# Patient Record
Sex: Male | Born: 1940 | Race: Black or African American | Hispanic: No | Smoking: Former smoker
Health system: Southern US, Community
[De-identification: ages and names within clinical notes are randomized; demographics above are authoritative.]

## PROBLEM LIST (undated history)

## (undated) DIAGNOSIS — R202 Paresthesia of skin: Secondary | ICD-10-CM

## (undated) DIAGNOSIS — R7303 Prediabetes: Secondary | ICD-10-CM

## (undated) DIAGNOSIS — R200 Anesthesia of skin: Secondary | ICD-10-CM

## (undated) DIAGNOSIS — F32A Depression, unspecified: Secondary | ICD-10-CM

## (undated) DIAGNOSIS — M1990 Unspecified osteoarthritis, unspecified site: Secondary | ICD-10-CM

## (undated) DIAGNOSIS — I8390 Asymptomatic varicose veins of unspecified lower extremity: Secondary | ICD-10-CM

## (undated) DIAGNOSIS — I350 Nonrheumatic aortic (valve) stenosis: Secondary | ICD-10-CM

## (undated) DIAGNOSIS — F329 Major depressive disorder, single episode, unspecified: Secondary | ICD-10-CM

## (undated) DIAGNOSIS — F419 Anxiety disorder, unspecified: Secondary | ICD-10-CM

## (undated) DIAGNOSIS — I10 Essential (primary) hypertension: Secondary | ICD-10-CM

## (undated) DIAGNOSIS — K219 Gastro-esophageal reflux disease without esophagitis: Secondary | ICD-10-CM

## (undated) DIAGNOSIS — G4730 Sleep apnea, unspecified: Secondary | ICD-10-CM

## (undated) HISTORY — DX: Nonrheumatic aortic (valve) stenosis: I35.0

## (undated) HISTORY — DX: Essential (primary) hypertension: I10

## (undated) HISTORY — DX: Unspecified osteoarthritis, unspecified site: M19.90

## (undated) HISTORY — DX: Asymptomatic varicose veins of unspecified lower extremity: I83.90

---

## 1999-08-15 ENCOUNTER — Encounter: Admission: RE | Admit: 1999-08-15 | Discharge: 1999-08-15 | Payer: Self-pay | Admitting: Nephrology

## 1999-08-15 ENCOUNTER — Encounter: Payer: Self-pay | Admitting: Nephrology

## 1999-10-26 ENCOUNTER — Ambulatory Visit: Admission: RE | Admit: 1999-10-26 | Discharge: 1999-10-26 | Payer: Self-pay | Admitting: Internal Medicine

## 2002-05-26 ENCOUNTER — Emergency Department (HOSPITAL_COMMUNITY): Admission: EM | Admit: 2002-05-26 | Discharge: 2002-05-26 | Payer: Self-pay | Admitting: Emergency Medicine

## 2002-05-26 ENCOUNTER — Encounter: Payer: Self-pay | Admitting: Emergency Medicine

## 2003-10-29 ENCOUNTER — Ambulatory Visit (HOSPITAL_COMMUNITY): Admission: RE | Admit: 2003-10-29 | Discharge: 2003-10-29 | Payer: Self-pay | Admitting: Gastroenterology

## 2003-12-24 ENCOUNTER — Encounter: Admission: RE | Admit: 2003-12-24 | Discharge: 2003-12-24 | Payer: Self-pay | Admitting: Gastroenterology

## 2005-01-23 ENCOUNTER — Ambulatory Visit (HOSPITAL_COMMUNITY): Admission: RE | Admit: 2005-01-23 | Discharge: 2005-01-23 | Payer: Self-pay | Admitting: Emergency Medicine

## 2011-07-03 ENCOUNTER — Encounter (HOSPITAL_COMMUNITY): Payer: Self-pay

## 2011-07-03 ENCOUNTER — Emergency Department (HOSPITAL_COMMUNITY)
Admission: EM | Admit: 2011-07-03 | Discharge: 2011-07-03 | Disposition: A | Discharge status: Home / Self Care | Payer: Medicare Other | Attending: Emergency Medicine | Admitting: Emergency Medicine

## 2011-07-03 DIAGNOSIS — Z7982 Long term (current) use of aspirin: Secondary | ICD-10-CM | POA: Insufficient documentation

## 2011-07-03 DIAGNOSIS — R609 Edema, unspecified: Secondary | ICD-10-CM | POA: Insufficient documentation

## 2011-07-03 DIAGNOSIS — IMO0002 Reserved for concepts with insufficient information to code with codable children: Secondary | ICD-10-CM | POA: Insufficient documentation

## 2011-07-03 DIAGNOSIS — Z8639 Personal history of other endocrine, nutritional and metabolic disease: Secondary | ICD-10-CM | POA: Insufficient documentation

## 2011-07-03 DIAGNOSIS — M1990 Unspecified osteoarthritis, unspecified site: Secondary | ICD-10-CM | POA: Insufficient documentation

## 2011-07-03 DIAGNOSIS — I10 Essential (primary) hypertension: Secondary | ICD-10-CM | POA: Insufficient documentation

## 2011-07-03 DIAGNOSIS — M545 Low back pain, unspecified: Secondary | ICD-10-CM | POA: Insufficient documentation

## 2011-07-03 DIAGNOSIS — F411 Generalized anxiety disorder: Secondary | ICD-10-CM | POA: Insufficient documentation

## 2011-07-03 DIAGNOSIS — M79609 Pain in unspecified limb: Secondary | ICD-10-CM | POA: Insufficient documentation

## 2011-07-03 DIAGNOSIS — Z862 Personal history of diseases of the blood and blood-forming organs and certain disorders involving the immune mechanism: Secondary | ICD-10-CM | POA: Insufficient documentation

## 2011-07-03 DIAGNOSIS — Z79899 Other long term (current) drug therapy: Secondary | ICD-10-CM | POA: Insufficient documentation

## 2011-07-04 ENCOUNTER — Ambulatory Visit (HOSPITAL_COMMUNITY)
Admission: AD | Admit: 2011-07-04 | Discharge: 2011-07-04 | Disposition: A | Discharge status: Home / Self Care | Payer: Medicare Other | Source: Ambulatory Visit | Attending: Family Medicine | Admitting: Family Medicine

## 2011-07-04 DIAGNOSIS — M7989 Other specified soft tissue disorders: Secondary | ICD-10-CM

## 2011-07-04 DIAGNOSIS — M79609 Pain in unspecified limb: Secondary | ICD-10-CM

## 2013-08-06 ENCOUNTER — Encounter (HOSPITAL_COMMUNITY): Payer: Self-pay | Admitting: Emergency Medicine

## 2013-08-06 ENCOUNTER — Emergency Department (HOSPITAL_COMMUNITY)
Admission: EM | Admit: 2013-08-06 | Discharge: 2013-08-06 | Disposition: A | Discharge status: Home / Self Care | Payer: Medicare Other | Attending: Emergency Medicine | Admitting: Emergency Medicine

## 2013-08-06 DIAGNOSIS — I10 Essential (primary) hypertension: Secondary | ICD-10-CM | POA: Insufficient documentation

## 2013-08-06 DIAGNOSIS — M25559 Pain in unspecified hip: Secondary | ICD-10-CM | POA: Insufficient documentation

## 2013-08-06 DIAGNOSIS — Z8719 Personal history of other diseases of the digestive system: Secondary | ICD-10-CM | POA: Insufficient documentation

## 2013-08-06 DIAGNOSIS — M7989 Other specified soft tissue disorders: Secondary | ICD-10-CM | POA: Insufficient documentation

## 2013-08-06 DIAGNOSIS — M79604 Pain in right leg: Secondary | ICD-10-CM

## 2013-08-06 HISTORY — DX: Gastro-esophageal reflux disease without esophagitis: K21.9

## 2013-08-06 LAB — CBC WITH DIFFERENTIAL/PLATELET
Basophils Absolute: 0 10*3/uL (ref 0.0–0.1)
Basophils Relative: 0 % (ref 0–1)
Eosinophils Absolute: 0.2 10*3/uL (ref 0.0–0.7)
Eosinophils Relative: 3 % (ref 0–5)
HCT: 36.6 % — ABNORMAL LOW (ref 39.0–52.0)
Hemoglobin: 12.4 g/dL — ABNORMAL LOW (ref 13.0–17.0)
Lymphocytes Relative: 30 % (ref 12–46)
Lymphs Abs: 1.8 10*3/uL (ref 0.7–4.0)
MCH: 33.3 pg (ref 26.0–34.0)
MCHC: 33.9 g/dL (ref 30.0–36.0)
MCV: 98.4 fL (ref 78.0–100.0)
Monocytes Absolute: 0.5 10*3/uL (ref 0.1–1.0)
Monocytes Relative: 8 % (ref 3–12)
Neutro Abs: 3.5 10*3/uL (ref 1.7–7.7)
Neutrophils Relative %: 59 % (ref 43–77)
Platelets: 193 10*3/uL (ref 150–400)
RBC: 3.72 MIL/uL — ABNORMAL LOW (ref 4.22–5.81)
RDW: 13.4 % (ref 11.5–15.5)
WBC: 5.9 10*3/uL (ref 4.0–10.5)

## 2013-08-06 LAB — BASIC METABOLIC PANEL
BUN: 14 mg/dL (ref 6–23)
CO2: 29 mEq/L (ref 19–32)
Calcium: 9.9 mg/dL (ref 8.4–10.5)
Chloride: 103 mEq/L (ref 96–112)
Creatinine, Ser: 1 mg/dL (ref 0.50–1.35)
GFR calc Af Amer: 85 mL/min — ABNORMAL LOW (ref >90)
GFR calc non Af Amer: 74 mL/min — ABNORMAL LOW (ref >90)
Glucose, Bld: 95 mg/dL (ref 70–99)
Potassium: 3.7 mEq/L (ref 3.5–5.1)
Sodium: 140 mEq/L (ref 135–145)

## 2013-08-06 MED ORDER — ENOXAPARIN SODIUM 120 MG/0.8ML ~~LOC~~ SOLN
1.0000 mg/kg | Freq: Once | SUBCUTANEOUS | Status: AC
  Administered 2013-08-06: 110 mg via SUBCUTANEOUS
  Filled 2013-08-06: qty 0.8

## 2013-08-06 NOTE — ED Notes (Signed)
Pt c/o leg pain and swelling for the past three days, denies any injury, admits to driving to [place] last weekend,

## 2013-08-06 NOTE — ED Provider Notes (Signed)
CSN: [id number]     Arrival date & time 08/06/13  1802 History   First MD Initiated Contact with Patient 08/06/13 2052    Scribed for Benny Lennert, MD, the patient was seen in room APA07/APA07. This chart was scribed by Lewanda Rife, ED scribe. Patient's care was started at 8:56 PM  Chief Complaint  Patient presents with  . Leg Pain   (Consider location/radiation/quality/duration/timing/severity/associated sxs/prior Treatment) Patient is a 72 y.o. male presenting with leg pain. The history is provided by the patient. No language interpreter was used.  Leg Pain Location:  Leg Time since incident:  3 days Injury: no   Leg location:  R upper leg Pain details:    Radiates to:  Does not radiate   Severity:  Moderate   Onset quality:  Gradual   Timing:  Constant   Progression:  Worsening Chronicity:  New Associated symptoms: swelling   Associated symptoms: no back pain and no fatigue    HPI Comments: John Hanson is a 72 y.o. male who presents to the Emergency Department complaining of constant posterior right mid-thigh pain onset 4 days. Describes pain as worsening in severity. Reports associated worsening unilateral swelling. Reports pain is exacerbated by touch and walking. Denies any alleviating factors. Denies associated shortness of breath, chest pain, abdominal pain, and mildly antalgic gait. Reports recent long travel to [place] this past weekend.   Past Medical History  Diagnosis Date  . Hypertension   . GERD (gastroesophageal reflux disease)    History reviewed. No pertinent past surgical history. No family history on file. History  Substance Use Topics  . Smoking status: Never Smoker   . Smokeless tobacco: Not on file  . Alcohol Use: Not on file    Review of Systems  Constitutional: Negative for appetite change and fatigue.  HENT: Negative for congestion, ear discharge and sinus pressure.   Eyes: Negative for discharge.  Respiratory: Negative for  cough.   Cardiovascular: Positive for leg swelling. Negative for chest pain.  Gastrointestinal: Negative for abdominal pain and diarrhea.  Genitourinary: Negative for frequency and hematuria.  Musculoskeletal: Negative for back pain.  Skin: Negative for rash.  Neurological: Negative for seizures and headaches.  Psychiatric/Behavioral: Negative for hallucinations.  All other systems reviewed and are negative.    Allergies  Review of patient's allergies indicates no known allergies.  Home Medications  No current outpatient prescriptions on file. BP 150/71  Pulse 86  Temp(Src) 98 F (36.7 C) (Oral)  Resp 16  Ht 5' 8.5" (1.74 m)  Wt 240 lb (108.863 kg)  BMI 35.96 kg/m2  SpO2 97% Physical Exam  Constitutional: He is oriented to person, place, and time. He appears well-developed.  HENT:  Head: Normocephalic.  Eyes: Conjunctivae and EOM are normal. No scleral icterus.  Neck: Neck supple. No thyromegaly present.  Cardiovascular: Normal rate and regular rhythm.  Exam reveals no gallop and no friction rub.   No murmur heard. Pulmonary/Chest: No stridor. He has no wheezes. He has no rales. He exhibits no tenderness.  Abdominal: He exhibits no distension. There is no tenderness. There is no rebound.  Musculoskeletal: Normal range of motion. He exhibits no edema.  Right leg distally is swollen  TTP  Posterior right mid-thigh    Lymphadenopathy:    He has no cervical adenopathy.  Neurological: He is oriented to person, place, and time. He exhibits normal muscle tone. Coordination normal.  Skin: No rash noted. No erythema.  Psychiatric: He has a normal mood  and affect. His behavior is normal.    ED Course  Procedures (including critical care time)  COORDINATION OF CARE:  Nursing notes reviewed. Vital signs reviewed. Initial pt interview and examination performed.   9:13 PM-Discussed work up plan with pt at bedside, which includes CBC with diff, and BMP. Pt agrees with  plan.  COORDINATION OF CARE:  Nursing notes reviewed. Vital signs reviewed. Initial pt interview and examination performed.   10:38 PM-Discussed work up plan with pt at bedside, which includes doppler study of posterior mid-thigh pain, and lovenox. Pt agrees with plan.   Treatment plan initiated: Medications  enoxaparin (LOVENOX) injection 110 mg (not administered)     Initial diagnostic testing ordered.    Treatment plan initiated: Medications  enoxaparin (LOVENOX) injection 110 mg (not administered)     Initial diagnostic testing ordered.    Labs Review Labs Reviewed  CBC WITH DIFFERENTIAL - Abnormal; Notable for the following:    RBC 3.72 (*)    Hemoglobin 12.4 (*)    HCT 36.6 (*)    All other components within normal limits  BASIC METABOLIC PANEL - Abnormal; Notable for the following:    GFR calc non Af Amer 74 (*)    GFR calc Af Amer 85 (*)    All other components within normal limits   Imaging Review No results found.  EKG Interpretation   None       MDM  No diagnosis found. Swollen right leg.  Will get venous US tomorrow The chart was scribed for me under my direct supervision.  I personally performed the history, physical, and medical decision making and all procedures in the evaluation of this patient.Benny Lennert, MD 08/07/13 2215

## 2013-08-07 ENCOUNTER — Other Ambulatory Visit (HOSPITAL_COMMUNITY): Payer: Self-pay | Admitting: Emergency Medicine

## 2013-08-07 ENCOUNTER — Ambulatory Visit (HOSPITAL_COMMUNITY)
Admission: RE | Admit: 2013-08-07 | Discharge: 2013-08-07 | Disposition: A | Discharge status: Home / Self Care | Payer: Medicare Other | Source: Ambulatory Visit | Attending: Emergency Medicine | Admitting: Emergency Medicine

## 2013-08-07 DIAGNOSIS — M79604 Pain in right leg: Secondary | ICD-10-CM

## 2013-08-07 DIAGNOSIS — M79609 Pain in unspecified limb: Secondary | ICD-10-CM | POA: Insufficient documentation

## 2013-08-07 MED ORDER — TRAMADOL HCL 50 MG PO TABS: 50.0000 mg | ORAL_TABLET | Freq: Four times a day (QID) | ORAL | Status: DC | PRN

## 2013-08-08 ENCOUNTER — Ambulatory Visit (HOSPITAL_COMMUNITY): Payer: Medicare Other

## 2013-08-15 ENCOUNTER — Ambulatory Visit
Admission: RE | Admit: 2013-08-15 | Discharge: 2013-08-15 | Disposition: A | Discharge status: Home / Self Care | Payer: Medicare HMO | Source: Ambulatory Visit | Attending: Specialist | Admitting: Specialist

## 2013-08-15 ENCOUNTER — Other Ambulatory Visit: Payer: Self-pay | Admitting: Specialist

## 2013-08-15 ENCOUNTER — Other Ambulatory Visit (HOSPITAL_COMMUNITY): Payer: Self-pay | Admitting: Specialist

## 2013-08-15 DIAGNOSIS — I878 Other specified disorders of veins: Secondary | ICD-10-CM

## 2013-08-15 MED ORDER — IOHEXOL 300 MG/ML  SOLN
150.0000 mL | Freq: Once | INTRAMUSCULAR | Status: AC | PRN
  Administered 2013-08-15: 150 mL via INTRAVENOUS

## 2013-09-07 ENCOUNTER — Other Ambulatory Visit: Payer: Self-pay | Admitting: Specialist

## 2013-09-07 DIAGNOSIS — M25452 Effusion, left hip: Secondary | ICD-10-CM

## 2013-09-07 DIAGNOSIS — M7989 Other specified soft tissue disorders: Secondary | ICD-10-CM

## 2013-09-14 ENCOUNTER — Other Ambulatory Visit: Payer: Medicare Other

## 2013-09-15 ENCOUNTER — Ambulatory Visit
Admission: RE | Admit: 2013-09-15 | Discharge: 2013-09-15 | Disposition: A | Discharge status: Home / Self Care | Payer: Medicare HMO | Source: Ambulatory Visit | Attending: Specialist | Admitting: Specialist

## 2013-09-15 ENCOUNTER — Other Ambulatory Visit: Payer: Self-pay | Admitting: Specialist

## 2013-09-15 DIAGNOSIS — M25452 Effusion, left hip: Secondary | ICD-10-CM

## 2013-09-15 DIAGNOSIS — M7989 Other specified soft tissue disorders: Secondary | ICD-10-CM

## 2013-09-15 MED ORDER — GADOBENATE DIMEGLUMINE 529 MG/ML IV SOLN
20.0000 mL | Freq: Once | INTRAVENOUS | Status: AC | PRN
  Administered 2013-09-15: 20 mL via INTRAVENOUS

## 2013-11-20 ENCOUNTER — Other Ambulatory Visit: Payer: Self-pay | Admitting: Specialist

## 2013-11-20 DIAGNOSIS — S8011XA Contusion of right lower leg, initial encounter: Secondary | ICD-10-CM

## 2013-11-28 ENCOUNTER — Ambulatory Visit
Admission: RE | Admit: 2013-11-28 | Discharge: 2013-11-28 | Disposition: A | Discharge status: Home / Self Care | Payer: Commercial Managed Care - HMO | Source: Ambulatory Visit | Attending: Specialist | Admitting: Specialist

## 2013-11-28 DIAGNOSIS — S8011XA Contusion of right lower leg, initial encounter: Secondary | ICD-10-CM

## 2013-12-21 ENCOUNTER — Other Ambulatory Visit: Payer: Self-pay | Admitting: *Deleted

## 2013-12-21 DIAGNOSIS — M79609 Pain in unspecified limb: Secondary | ICD-10-CM

## 2014-02-07 ENCOUNTER — Encounter: Payer: Self-pay | Admitting: Vascular Surgery

## 2014-02-08 ENCOUNTER — Ambulatory Visit (HOSPITAL_COMMUNITY)
Admission: RE | Admit: 2014-02-08 | Discharge: 2014-02-08 | Disposition: A | Discharge status: Home / Self Care | Payer: Medicare HMO | Source: Ambulatory Visit | Attending: Vascular Surgery | Admitting: Vascular Surgery

## 2014-02-08 ENCOUNTER — Ambulatory Visit (INDEPENDENT_AMBULATORY_CARE_PROVIDER_SITE_OTHER): Payer: Commercial Managed Care - HMO | Admitting: Vascular Surgery

## 2014-02-08 ENCOUNTER — Encounter: Payer: Self-pay | Admitting: Vascular Surgery

## 2014-02-08 VITALS — BP 177/91 | HR 72 | Ht 68.5 in | Wt 238.0 lb

## 2014-02-08 DIAGNOSIS — M79609 Pain in unspecified limb: Secondary | ICD-10-CM

## 2014-02-08 NOTE — Progress Notes (Signed)
[facility] [place] HISTORY AND PHYSICAL   CC:  Varicose veins and pain in right leg  Koirala, Dibas, MD  HPI: This is a 73 y.o. male who has a hx of HTN has complaints of pain in his right leg and swelling for ~ 1 year.  He states that he has tried thigh high compression stockings for 2 months, but he did not get any relief from the pain.  He has not ever had any surgery on his right leg or trauma to the right leg.  He denies any hx of DVT.  He is on medication for HTN.  He does not have a hx of diabetes.  He does not have a surgical history.  He has a remote hx of cigar use.  Past Medical History  Diagnosis Date  . Hypertension   . GERD (gastroesophageal reflux disease)    History reviewed. No pertinent past surgical history.  No Known Allergies  Current Outpatient Prescriptions  Medication Sig Dispense Refill  . allopurinol (ZYLOPRIM) 300 MG tablet Take 300 mg by mouth daily.      Marland Kitchen amLODipine (NORVASC) 10 MG tablet Take 10 mg by mouth daily.      Marland Kitchen aspirin EC 81 MG tablet Take 81 mg by mouth daily.      . citalopram (CELEXA) 40 MG tablet Take 40 mg by mouth daily.      . furosemide (LASIX) 20 MG tablet Take 20 mg by mouth daily.      Marland Kitchen labetalol (NORMODYNE) 300 MG tablet Take 300 mg by mouth 3 (three) times daily.      Marland Kitchen losartan (COZAAR) 100 MG tablet Take 100 mg by mouth daily.      Marland Kitchen omeprazole (PRILOSEC) 40 MG capsule Take 40 mg by mouth daily.      . traMADol (ULTRAM) 50 MG tablet Take 1 tablet (50 mg total) by mouth every 6 (six) hours as needed.  15 tablet  0   No current facility-administered medications for this visit.    History reviewed. No pertinent family history.  History   Social History  . Marital Status: Widowed    Spouse Name: N/A    Number of Children: N/A  . Years of Education: N/A   Occupational History  . Not on file.   Social History Main Topics  . Smoking status: Former Smoker    Types: Cigars  . Smokeless tobacco:  Never Used  . Alcohol Use: No  . Drug Use: No  . Sexual Activity: Not on file   Other Topics Concern  . Not on file   Social History Narrative  . No narrative on file     ROS: [x]  Positive   [ ]  Negative   [ ]  All sytems reviewed and are negative  Cardiovascular: []  chest pain/pressure []  palpitations []  SOB lying flat []  DOE [x] pain in legs while walking []  pain in feet when lying flat []  hx of DVT [x]  hx of phlebitis []  swelling in legs [x]  varicose veins  Pulmonary: []  productive cough []  asthma []  wheezing  Neurologic: []  weakness in []  arms []  legs []  numbness in []  arms []  legs [] difficulty speaking or slurred speech []  temporary loss of vision in one eye []  dizziness  Hematologic: []  bleeding problems []  problems with blood clotting easily  GI []  vomiting blood []  blood in stool  GU: []  burning with urination []  blood in urine  Psychiatric: []  hx of major depression  Integumentary: []   rashes []  ulcers  Constitutional: []  fever []  chills   PHYSICAL EXAMINATION:  Filed Vitals:   02/08/14 1435  BP: 177/91  Pulse: 72   Body mass index is 35.66 kg/(m^2).  General:  WDWN in NAD Gait: Not observed HENT: WNL, normocephalic Eyes: Pupils equal Pulmonary: normal non-labored breathing , without Rales, rhonchi,  wheezing Cardiac: RRR, without  Murmurs, rubs or gallops; without carotid bruits Abdomen: soft, NT, no masses Skin: without rashes, without ulcers  Vascular Exam/Pulses: 2+ radial pulses bilaterally 1+ DP pulses bilaterally  Extremities: without ischemic changes, without Gangrene , without cellulitis; without open wounds;  His right leg is edematous and ~ 10% larger than the left leg.  He does have a prominent saphenous vein on the right that measures approximately 0.4 cm. Musculoskeletal: no muscle wasting or atrophy  Neurologic: A&O X 3; Appropriate Affect ; SENSATION: normal; MOTOR FUNCTION:  moving all extremities equally.  Speech is fluent/normal   Non-Invasive Vascular Imaging:  Lower extremity duplex 02/08/14  1.  No evidence of DVT noted in the right femoral-popliteal  2.  No evidence of superficial vein thrombosis noted in the RLE 3.  Venous incompetence is noted in the right great saphenous and common femoral veins.  Size of right GSV ranges from 0.45-0.67cm  Pt meds includes: Statin:  no Beta Blocker:  yes Aspirin:  yes ACEI:  no ARB:  yes Other Antiplatelet/Anticoagulant:  no   ASSESSMENT/PLAN: 73 y.o. 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  -will give Rx for compression stockings today for him to wear for 3 months.  Will have him follow up in 3 months for laser ablation of the GSV on the right as he will benefit from this as he does have reflux noted in the the right GSV.   -until then, in addition to the stockings, he may take Aleve and keep legs elevated when not ambulating.  Leontine Locket, PA-C [facility] [phone]  Clinic MD:  Pt seen and examined in conjunction with Dr. Oneida Alar  History and exam details as above. The patient is very active at work and his pain in his lower extremities it is exacerbated by this. He has evidence of deep and superficial venous reflux in the right leg. Palpable varicose saphenous vein right leg with edema. The right greater saphenous vein is 5-7 mm in diameter. Do a trial compression therapy. His symptoms are not improved we will consider laser ablation. Pathophysiology of deep and superficial venous incompetence were explained the patient today.  Ruta Hinds, MD [facility] [place] Office: [phone] Pager: [phone]

## 2014-05-14 ENCOUNTER — Encounter: Payer: Self-pay | Admitting: Vascular Surgery

## 2014-05-15 ENCOUNTER — Encounter: Payer: Self-pay | Admitting: Vascular Surgery

## 2014-05-15 ENCOUNTER — Ambulatory Visit (INDEPENDENT_AMBULATORY_CARE_PROVIDER_SITE_OTHER): Payer: Commercial Managed Care - HMO | Admitting: Vascular Surgery

## 2014-05-15 VITALS — BP 155/87 | HR 67 | Ht 68.5 in | Wt 239.0 lb

## 2014-05-15 DIAGNOSIS — I83893 Varicose veins of bilateral lower extremities with other complications: Secondary | ICD-10-CM | POA: Insufficient documentation

## 2014-05-15 NOTE — Progress Notes (Signed)
Referred by:  Lujean Amel, MD [address], [place] [address]  Reason for referral: Right swollen leg  History of Present Illness  John Hanson is a 73 y.o. (1941-06-03) male who presents for three month follow-up of right swollen and painful leg.  Patient notes onset of swelling one year ago. He has tried thigh high compression stockings over the past five months with no relief. He has been using ibuprofen and elevation for pain relief. He was last seen in the office on 02/08/14 by Dr. Oneida Alar at which he had only been wearing compression stockings for 2 months.    Past Medical History  Diagnosis Date  . Hypertension   . GERD (gastroesophageal reflux disease)     History reviewed. No pertinent past surgical history.  History   Social History  . Marital Status: Widowed    Spouse Name: N/A    Number of Children: N/A  . Years of Education: N/A   Occupational History  . Not on file.   Social History Main Topics  . Smoking status: Former Smoker    Types: Cigars  . Smokeless tobacco: Never Used  . Alcohol Use: No  . Drug Use: No  . Sexual Activity: Not on file   Other Topics Concern  . Not on file   Social History Narrative  . No narrative on file    No family history on file.   Current Outpatient Prescriptions on File Prior to Visit  Medication Sig Dispense Refill  . allopurinol (ZYLOPRIM) 300 MG tablet Take 300 mg by mouth daily.      Marland Kitchen amLODipine (NORVASC) 10 MG tablet Take 10 mg by mouth daily.      Marland Kitchen aspirin EC 81 MG tablet Take 81 mg by mouth daily.      . citalopram (CELEXA) 40 MG tablet Take 40 mg by mouth daily.      . furosemide (LASIX) 20 MG tablet Take 20 mg by mouth daily.      Marland Kitchen labetalol (NORMODYNE) 300 MG tablet Take 300 mg by mouth 3 (three) times daily.      Marland Kitchen losartan (COZAAR) 100 MG tablet Take 100 mg by mouth daily.      Marland Kitchen omeprazole (PRILOSEC) 40 MG capsule Take 40 mg by mouth daily.      . traMADol (ULTRAM)  50 MG tablet Take 1 tablet (50 mg total) by mouth every 6 (six) hours as needed.  15 tablet  0   No current facility-administered medications on file prior to visit.    No Known Allergies  REVIEW OF SYSTEMS:  (Positives checked otherwise negative)  CARDIOVASCULAR:  []  chest pain, []  chest pressure, []  palpitations, []  shortness of breath when laying flat, []  shortness of breath with exertion,  [x]  pain in legs when walking, []  pain in feet when laying flat, []  history of blood clot in veins (DVT), [x]  history of phlebitis, x[]  swelling in legs, [x]  varicose veins  PULMONARY:  []  productive cough, []  asthma, []  wheezing  NEUROLOGIC:  []  weakness in arms or legs, []  numbness in arms or legs, []  difficulty speaking or slurred speech, []  temporary loss of vision in one eye, []  dizziness  HEMATOLOGIC:  []  bleeding problems, []  problems with blood clotting too easily  MUSCULOSKEL:  []  joint pain, []  joint swelling  GASTROINTEST:  []  vomiting blood, []  blood in stool     GENITOURINARY:  []  burning with urination, []  blood in urine  PSYCHIATRIC:  []   history of major depression  INTEGUMENTARY:  []  rashes, []  ulcers  CONSTITUTIONAL:  []  fever, []  chills   Physical Examination Filed Vitals:   05/15/14 1620  BP: 155/87  Pulse: 67  Height: 5' 8.5" (1.74 m)  Weight: 239 lb (108.41 kg)  SpO2: 100%   Body mass index is 35.81 kg/(m^2).  General: A&O x 3, WDWN in NAD Extremities: No ischemic changes. Bilateral leg edema, right greater than left. Prominent saphenous vein on right   Non-Invasive Vascular Imaging: Lower extremity duplex 02/08/14  1. No evidence of DVT noted in the right femoral-popliteal  2. No evidence of superficial vein thrombosis noted in the RLE  3. Venous incompetence is noted in the right great saphenous and common femoral veins.  Size of right GSV ranges from 0.45-0.67cm   Medical Decision Making  John Hanson is a 73 y.o. male who presents with: right  lower leg chronic venous insufficiency   The patient has tried a three month trial of  20-30 mm thigh high compression stockings and has had no relief of his symptoms.   Based on the patient's history and examination, I recommend: laser ablation of the right great saphenous vein.   The procedure, benefits, and risks were discussed at length with the patient and he has decided to proceed.   His laser ablation of the right great saphenous vein will be scheduled in the next few weeks.   Virgina Jock, PA-C [facility] Office: [phone] Pager: [phone]  05/15/2014, 4:39 PM  This patient was seen in conjunction with Dr. Donnetta Hutching.  I have examined the patient, reviewed and agree with above. The patient continues to have difficulty despite maximal medical treatment. This is included thigh-high graduated compression, elevation and ibuprofen for discomfort. I did review his duplex discussed options for laser ablation. He does have mild symptoms on the left is questioning treatment left. I explained we would not recommend this this time since his symptoms are not significant. Explained that he did have progression he could have treatment in the future. He wished to proceed with laser ablation. I discussed this with the patient including the slight risk of DVT associated with the procedure. He wished to proceed as soon as possible  Gaytha Raybourn, MD 05/15/2014 5:06 PM

## 2014-05-24 ENCOUNTER — Other Ambulatory Visit: Payer: Self-pay | Admitting: *Deleted

## 2014-05-24 DIAGNOSIS — I83893 Varicose veins of bilateral lower extremities with other complications: Secondary | ICD-10-CM

## 2014-05-30 ENCOUNTER — Encounter: Payer: Self-pay | Admitting: Vascular Surgery

## 2014-05-31 ENCOUNTER — Encounter: Payer: Self-pay | Admitting: Vascular Surgery

## 2014-05-31 ENCOUNTER — Ambulatory Visit (INDEPENDENT_AMBULATORY_CARE_PROVIDER_SITE_OTHER): Payer: Commercial Managed Care - HMO | Admitting: Vascular Surgery

## 2014-05-31 VITALS — BP 195/92 | HR 62 | Resp 18 | Ht 68.5 in | Wt 241.0 lb

## 2014-05-31 DIAGNOSIS — I83893 Varicose veins of bilateral lower extremities with other complications: Secondary | ICD-10-CM

## 2014-05-31 HISTORY — PX: ENDOVENOUS ABLATION SAPHENOUS VEIN W/ LASER: SUR449

## 2014-05-31 NOTE — Progress Notes (Signed)
   Laser Ablation Procedure      Date: 05/31/2014    John Hanson DOB:1941-07-02  Consent signed: Yes  Surgeon:T.F. Aspasia Rude  Procedure: Laser Ablation: right Greater Saphenous Vein  BP 195/92  Pulse 62  Resp 18  Ht 5' 8.5" (1.74 m)  Wt 241 lb (109.317 kg)  BMI 36.11 kg/m2  Start time: 10:35   End time: 11:12  Tumescent Anesthesia: 450 cc 0.9% NaCl with 50 cc Lidocaine HCL with 1% Epi and 15 cc 8.4% NaHCO3  Local Anesthesia: 6 cc Lidocaine HCL and NaHCO3 (ratio 2:1)  Continuous Mode: 15w Total Energy 3202 Total Time3:32     Patient tolerated procedure well: Yes  Notes: Ablation from just below the knee to just below the saphenofemoral junction  Description of Procedure:  After marking the course of the saphenous vein and the secondary varicosities in the standing position, the patient was placed on the operating table in the supine position, and the right leg was prepped and draped in sterile fashion. Local anesthetic was administered, and under ultrasound guidance the saphenous vein was accessed with a micro needle and guide wire; then the micro puncture sheath was placed. A guide wire was inserted to the saphenofemoral junction, followed by a 5 french sheath.  The position of the sheath and then the laser fiber below the junction was confirmed using the ultrasound and visualization of the aiming beam.  Tumescent anesthesia was administered along the course of the saphenous vein using ultrasound guidance. Protective laser glasses were placed on the patient, and the laser was fired at at 15 watt continuous mode.  For a total of 3202 joules.  A steri strip was applied to the puncture site.    ABD pads and thigh high compression stockings were applied.  Ace wrap bandages were applied  at the top of the saphenofemoral junction.  Blood loss was less than 15 cc.  The patient ambulated out of the operating room having tolerated the procedure well.

## 2014-06-01 ENCOUNTER — Encounter: Payer: Self-pay | Admitting: Vascular Surgery

## 2014-06-05 ENCOUNTER — Telehealth: Payer: Self-pay | Admitting: *Deleted

## 2014-06-05 NOTE — Telephone Encounter (Signed)
    06/05/2014  Time: 8:48 AM   Patient Name: John Hanson  Patient of: T.F. Early  Procedure:Laser Ablation right greater saphenous vein  05-31-2014  Reached patient at home and checked  His status  Yes    Comments/Actions Taken: Mr. Torrez states he is doing well and not experiencing leg pain or swelling.  Reviewed post procedural instructions with him and reminded him of post LA duplex and VV FU with Dr. Donnetta Hutching on 06-07-2014.      @SIGNATURE @

## 2014-06-06 ENCOUNTER — Encounter: Payer: Self-pay | Admitting: Vascular Surgery

## 2014-06-07 ENCOUNTER — Encounter: Payer: Self-pay | Admitting: Vascular Surgery

## 2014-06-07 ENCOUNTER — Ambulatory Visit (INDEPENDENT_AMBULATORY_CARE_PROVIDER_SITE_OTHER): Payer: Commercial Managed Care - HMO | Admitting: Vascular Surgery

## 2014-06-07 ENCOUNTER — Ambulatory Visit (HOSPITAL_COMMUNITY)
Admission: RE | Admit: 2014-06-07 | Discharge: 2014-06-07 | Disposition: A | Discharge status: Home / Self Care | Payer: Medicare HMO | Source: Ambulatory Visit | Attending: Vascular Surgery | Admitting: Vascular Surgery

## 2014-06-07 VITALS — BP 182/95 | HR 64 | Resp 16 | Ht 68.5 in | Wt 240.0 lb

## 2014-06-07 DIAGNOSIS — Z9889 Other specified postprocedural states: Secondary | ICD-10-CM | POA: Diagnosis not present

## 2014-06-07 DIAGNOSIS — I83893 Varicose veins of bilateral lower extremities with other complications: Secondary | ICD-10-CM

## 2014-06-07 NOTE — Progress Notes (Signed)
Here today for one-week followup after laser ablation of right great saphenous vein. He has the usual amount of mild soreness in his medial thigh and very mild bruising as well. He has been compliant with his compression garment.  On physical exam he does have mild bruising. Does have the usual amount of thickness on the ablation site. No distal swelling.  Followup venous duplex today and successful ablation of her great saphenous vein distal insertion site was junction. No evidence of DVT  Impression and plan successful laser ablation of right great saphenous vein for venous hypertension. Will will wear his compression garments one additional week and then as an as-needed basis. Will see [place] again as an as needed basis as well

## 2014-07-12 ENCOUNTER — Other Ambulatory Visit: Payer: Self-pay | Admitting: Family Medicine

## 2014-07-12 DIAGNOSIS — M545 Low back pain, unspecified: Secondary | ICD-10-CM

## 2014-07-20 ENCOUNTER — Ambulatory Visit
Admission: RE | Admit: 2014-07-20 | Discharge: 2014-07-20 | Disposition: A | Discharge status: Home / Self Care | Payer: Commercial Managed Care - HMO | Source: Ambulatory Visit | Attending: Family Medicine | Admitting: Family Medicine

## 2014-07-20 DIAGNOSIS — M545 Low back pain, unspecified: Secondary | ICD-10-CM

## 2015-01-01 DIAGNOSIS — M4806 Spinal stenosis, lumbar region: Secondary | ICD-10-CM | POA: Diagnosis not present

## 2015-01-01 DIAGNOSIS — M47816 Spondylosis without myelopathy or radiculopathy, lumbar region: Secondary | ICD-10-CM | POA: Diagnosis not present

## 2015-01-14 DIAGNOSIS — M545 Low back pain: Secondary | ICD-10-CM | POA: Diagnosis not present

## 2015-01-24 DIAGNOSIS — M2560 Stiffness of unspecified joint, not elsewhere classified: Secondary | ICD-10-CM | POA: Diagnosis not present

## 2015-01-24 DIAGNOSIS — M4806 Spinal stenosis, lumbar region: Secondary | ICD-10-CM | POA: Diagnosis not present

## 2015-01-24 DIAGNOSIS — M545 Low back pain: Secondary | ICD-10-CM | POA: Diagnosis not present

## 2015-01-24 DIAGNOSIS — M6281 Muscle weakness (generalized): Secondary | ICD-10-CM | POA: Diagnosis not present

## 2015-01-30 DIAGNOSIS — M545 Low back pain: Secondary | ICD-10-CM | POA: Diagnosis not present

## 2015-01-30 DIAGNOSIS — M6281 Muscle weakness (generalized): Secondary | ICD-10-CM | POA: Diagnosis not present

## 2015-01-30 DIAGNOSIS — M2560 Stiffness of unspecified joint, not elsewhere classified: Secondary | ICD-10-CM | POA: Diagnosis not present

## 2015-01-30 DIAGNOSIS — M4806 Spinal stenosis, lumbar region: Secondary | ICD-10-CM | POA: Diagnosis not present

## 2015-01-31 DIAGNOSIS — M4806 Spinal stenosis, lumbar region: Secondary | ICD-10-CM | POA: Diagnosis not present

## 2015-01-31 DIAGNOSIS — M545 Low back pain: Secondary | ICD-10-CM | POA: Diagnosis not present

## 2015-01-31 DIAGNOSIS — M2560 Stiffness of unspecified joint, not elsewhere classified: Secondary | ICD-10-CM | POA: Diagnosis not present

## 2015-01-31 DIAGNOSIS — M6281 Muscle weakness (generalized): Secondary | ICD-10-CM | POA: Diagnosis not present

## 2015-02-01 DIAGNOSIS — F419 Anxiety disorder, unspecified: Secondary | ICD-10-CM | POA: Diagnosis not present

## 2015-02-01 DIAGNOSIS — M5137 Other intervertebral disc degeneration, lumbosacral region: Secondary | ICD-10-CM | POA: Diagnosis not present

## 2015-02-01 DIAGNOSIS — G4700 Insomnia, unspecified: Secondary | ICD-10-CM | POA: Diagnosis not present

## 2015-02-01 DIAGNOSIS — Z79899 Other long term (current) drug therapy: Secondary | ICD-10-CM | POA: Diagnosis not present

## 2015-02-01 DIAGNOSIS — M109 Gout, unspecified: Secondary | ICD-10-CM | POA: Diagnosis not present

## 2015-02-01 DIAGNOSIS — I10 Essential (primary) hypertension: Secondary | ICD-10-CM | POA: Diagnosis not present

## 2015-02-04 DIAGNOSIS — M6281 Muscle weakness (generalized): Secondary | ICD-10-CM | POA: Diagnosis not present

## 2015-02-04 DIAGNOSIS — M545 Low back pain: Secondary | ICD-10-CM | POA: Diagnosis not present

## 2015-02-04 DIAGNOSIS — M2560 Stiffness of unspecified joint, not elsewhere classified: Secondary | ICD-10-CM | POA: Diagnosis not present

## 2015-02-04 DIAGNOSIS — M4806 Spinal stenosis, lumbar region: Secondary | ICD-10-CM | POA: Diagnosis not present

## 2015-02-06 DIAGNOSIS — M545 Low back pain: Secondary | ICD-10-CM | POA: Diagnosis not present

## 2015-02-06 DIAGNOSIS — M4806 Spinal stenosis, lumbar region: Secondary | ICD-10-CM | POA: Diagnosis not present

## 2015-02-06 DIAGNOSIS — M2560 Stiffness of unspecified joint, not elsewhere classified: Secondary | ICD-10-CM | POA: Diagnosis not present

## 2015-02-06 DIAGNOSIS — M6281 Muscle weakness (generalized): Secondary | ICD-10-CM | POA: Diagnosis not present

## 2015-02-12 DIAGNOSIS — M6281 Muscle weakness (generalized): Secondary | ICD-10-CM | POA: Diagnosis not present

## 2015-02-12 DIAGNOSIS — M4806 Spinal stenosis, lumbar region: Secondary | ICD-10-CM | POA: Diagnosis not present

## 2015-02-12 DIAGNOSIS — M2560 Stiffness of unspecified joint, not elsewhere classified: Secondary | ICD-10-CM | POA: Diagnosis not present

## 2015-02-12 DIAGNOSIS — M545 Low back pain: Secondary | ICD-10-CM | POA: Diagnosis not present

## 2015-02-13 DIAGNOSIS — M545 Low back pain: Secondary | ICD-10-CM | POA: Diagnosis not present

## 2015-02-13 DIAGNOSIS — M6281 Muscle weakness (generalized): Secondary | ICD-10-CM | POA: Diagnosis not present

## 2015-02-13 DIAGNOSIS — M4806 Spinal stenosis, lumbar region: Secondary | ICD-10-CM | POA: Diagnosis not present

## 2015-02-13 DIAGNOSIS — M2560 Stiffness of unspecified joint, not elsewhere classified: Secondary | ICD-10-CM | POA: Diagnosis not present

## 2015-02-18 DIAGNOSIS — M6281 Muscle weakness (generalized): Secondary | ICD-10-CM | POA: Diagnosis not present

## 2015-02-18 DIAGNOSIS — M2560 Stiffness of unspecified joint, not elsewhere classified: Secondary | ICD-10-CM | POA: Diagnosis not present

## 2015-02-18 DIAGNOSIS — M4806 Spinal stenosis, lumbar region: Secondary | ICD-10-CM | POA: Diagnosis not present

## 2015-02-18 DIAGNOSIS — M545 Low back pain: Secondary | ICD-10-CM | POA: Diagnosis not present

## 2015-02-20 DIAGNOSIS — M545 Low back pain: Secondary | ICD-10-CM | POA: Diagnosis not present

## 2015-02-20 DIAGNOSIS — M2560 Stiffness of unspecified joint, not elsewhere classified: Secondary | ICD-10-CM | POA: Diagnosis not present

## 2015-02-20 DIAGNOSIS — M6281 Muscle weakness (generalized): Secondary | ICD-10-CM | POA: Diagnosis not present

## 2015-02-20 DIAGNOSIS — M4806 Spinal stenosis, lumbar region: Secondary | ICD-10-CM | POA: Diagnosis not present

## 2015-03-01 DIAGNOSIS — M545 Low back pain: Secondary | ICD-10-CM | POA: Diagnosis not present

## 2015-03-01 DIAGNOSIS — M4806 Spinal stenosis, lumbar region: Secondary | ICD-10-CM | POA: Diagnosis not present

## 2015-03-01 DIAGNOSIS — M2560 Stiffness of unspecified joint, not elsewhere classified: Secondary | ICD-10-CM | POA: Diagnosis not present

## 2015-03-01 DIAGNOSIS — M6281 Muscle weakness (generalized): Secondary | ICD-10-CM | POA: Diagnosis not present

## 2015-03-11 DIAGNOSIS — M2560 Stiffness of unspecified joint, not elsewhere classified: Secondary | ICD-10-CM | POA: Diagnosis not present

## 2015-03-11 DIAGNOSIS — M4806 Spinal stenosis, lumbar region: Secondary | ICD-10-CM | POA: Diagnosis not present

## 2015-03-11 DIAGNOSIS — M6281 Muscle weakness (generalized): Secondary | ICD-10-CM | POA: Diagnosis not present

## 2015-03-11 DIAGNOSIS — M545 Low back pain: Secondary | ICD-10-CM | POA: Diagnosis not present

## 2015-03-13 DIAGNOSIS — M4806 Spinal stenosis, lumbar region: Secondary | ICD-10-CM | POA: Diagnosis not present

## 2015-03-13 DIAGNOSIS — M6281 Muscle weakness (generalized): Secondary | ICD-10-CM | POA: Diagnosis not present

## 2015-03-13 DIAGNOSIS — M545 Low back pain: Secondary | ICD-10-CM | POA: Diagnosis not present

## 2015-03-13 DIAGNOSIS — M2560 Stiffness of unspecified joint, not elsewhere classified: Secondary | ICD-10-CM | POA: Diagnosis not present

## 2015-03-19 DIAGNOSIS — M4806 Spinal stenosis, lumbar region: Secondary | ICD-10-CM | POA: Diagnosis not present

## 2015-03-21 DIAGNOSIS — M6281 Muscle weakness (generalized): Secondary | ICD-10-CM | POA: Diagnosis not present

## 2015-03-21 DIAGNOSIS — M4806 Spinal stenosis, lumbar region: Secondary | ICD-10-CM | POA: Diagnosis not present

## 2015-03-21 DIAGNOSIS — M545 Low back pain: Secondary | ICD-10-CM | POA: Diagnosis not present

## 2015-03-21 DIAGNOSIS — M2560 Stiffness of unspecified joint, not elsewhere classified: Secondary | ICD-10-CM | POA: Diagnosis not present

## 2015-03-28 DIAGNOSIS — M2560 Stiffness of unspecified joint, not elsewhere classified: Secondary | ICD-10-CM | POA: Diagnosis not present

## 2015-03-28 DIAGNOSIS — M6281 Muscle weakness (generalized): Secondary | ICD-10-CM | POA: Diagnosis not present

## 2015-03-28 DIAGNOSIS — M4806 Spinal stenosis, lumbar region: Secondary | ICD-10-CM | POA: Diagnosis not present

## 2015-03-28 DIAGNOSIS — M545 Low back pain: Secondary | ICD-10-CM | POA: Diagnosis not present

## 2015-04-05 DIAGNOSIS — M47816 Spondylosis without myelopathy or radiculopathy, lumbar region: Secondary | ICD-10-CM | POA: Diagnosis not present

## 2015-04-05 DIAGNOSIS — M4806 Spinal stenosis, lumbar region: Secondary | ICD-10-CM | POA: Diagnosis not present

## 2015-05-07 DIAGNOSIS — M47816 Spondylosis without myelopathy or radiculopathy, lumbar region: Secondary | ICD-10-CM | POA: Diagnosis not present

## 2015-05-24 DIAGNOSIS — M47816 Spondylosis without myelopathy or radiculopathy, lumbar region: Secondary | ICD-10-CM | POA: Diagnosis not present

## 2015-05-24 DIAGNOSIS — M4806 Spinal stenosis, lumbar region: Secondary | ICD-10-CM | POA: Diagnosis not present

## 2015-06-07 DIAGNOSIS — M4806 Spinal stenosis, lumbar region: Secondary | ICD-10-CM | POA: Diagnosis not present

## 2015-06-07 DIAGNOSIS — M47816 Spondylosis without myelopathy or radiculopathy, lumbar region: Secondary | ICD-10-CM | POA: Diagnosis not present

## 2015-06-14 ENCOUNTER — Ambulatory Visit
Admission: RE | Admit: 2015-06-14 | Discharge: 2015-06-14 | Disposition: A | Discharge status: Home / Self Care | Payer: Commercial Managed Care - HMO | Source: Ambulatory Visit | Attending: Family Medicine | Admitting: Family Medicine

## 2015-06-14 ENCOUNTER — Other Ambulatory Visit: Payer: Self-pay | Admitting: Family Medicine

## 2015-06-14 DIAGNOSIS — R0602 Shortness of breath: Secondary | ICD-10-CM | POA: Diagnosis not present

## 2015-06-14 DIAGNOSIS — R011 Cardiac murmur, unspecified: Secondary | ICD-10-CM | POA: Diagnosis not present

## 2015-06-14 DIAGNOSIS — Z23 Encounter for immunization: Secondary | ICD-10-CM | POA: Diagnosis not present

## 2015-06-14 DIAGNOSIS — M5137 Other intervertebral disc degeneration, lumbosacral region: Secondary | ICD-10-CM | POA: Diagnosis not present

## 2015-06-14 DIAGNOSIS — Z01818 Encounter for other preprocedural examination: Secondary | ICD-10-CM | POA: Diagnosis not present

## 2015-06-14 DIAGNOSIS — I10 Essential (primary) hypertension: Secondary | ICD-10-CM | POA: Diagnosis not present

## 2015-06-19 ENCOUNTER — Telehealth (HOSPITAL_COMMUNITY): Payer: Self-pay | Admitting: *Deleted

## 2015-07-04 ENCOUNTER — Telehealth: Payer: Self-pay | Admitting: Internal Medicine

## 2015-07-04 NOTE — Telephone Encounter (Signed)
Received records from [place] for appointment on 07/18/15 with Dr Debara Pickett.  Records given to [facility] (medical records) for Dr [facility] schedule on 07/18/15. lp

## 2015-07-18 ENCOUNTER — Ambulatory Visit (INDEPENDENT_AMBULATORY_CARE_PROVIDER_SITE_OTHER): Payer: Commercial Managed Care - HMO | Admitting: Internal Medicine

## 2015-07-18 ENCOUNTER — Encounter: Payer: Self-pay | Admitting: Internal Medicine

## 2015-07-18 VITALS — BP 136/88 | HR 67 | Ht 68.5 in | Wt 237.9 lb

## 2015-07-18 DIAGNOSIS — I10 Essential (primary) hypertension: Secondary | ICD-10-CM | POA: Diagnosis not present

## 2015-07-18 DIAGNOSIS — R011 Cardiac murmur, unspecified: Secondary | ICD-10-CM

## 2015-07-18 DIAGNOSIS — Z01810 Encounter for preprocedural cardiovascular examination: Secondary | ICD-10-CM | POA: Insufficient documentation

## 2015-07-18 NOTE — Patient Instructions (Signed)
Your physician has requested that you have an echocardiogram @ [address] Echocardiography is a painless test that uses sound waves to create images of your heart. It provides your doctor with information about the size and shape of your heart and how well your heart's chambers and valves are working. This procedure takes approximately one hour. There are no restrictions for this procedure.  Your physician recommends that you schedule a follow-up appointment as needed

## 2015-07-18 NOTE — Progress Notes (Signed)
OFFICE NOTE  Chief Complaint:  Preoperative cardiovascular assessment  Primary Care Physician: Lujean Amel, MD  HPI:  John Hanson is a pleasant 74 year old male is coming referred to me for preoperative clearance prior to back surgery. He said planning on undergoing spine surgery by Dr. Susa Day. He's recently been having some shortness of breath with exertion and underwent preoperative evaluation which demonstrated mildly abnormal EKG. I repeated the EKG in the office today which shows nonspecific lateral and inferior ST and T-wave changes. It is a normal sinus rhythm. He denies any chest pain but reports some mild shortness of breath. Recently was seen by his primary care provider who noted a systolic murmur. An echocardiogram was suggested that I do not see that it has been performed. He has no known cardiac history. There is no significant coronary disease in the family. He does have cardiac risk factors including hypertension, but no significant dyslipidemia or diabetes.  PMHx:  Past Medical History  Diagnosis Date  . Hypertension   . GERD (gastroesophageal reflux disease)   . Varicose veins     Past Surgical History  Procedure Laterality Date  . Endovenous ablation saphenous vein w/ laser Right 05-31-2014    EVLA  right gretaer saphenous vein by Curt Jews MD    FAMHx:  Family History  Problem Relation Age of Onset  . Kidney disease Mother     SOCHx:   reports that he has quit smoking. His smoking use included Cigars. He has never used smokeless tobacco. He reports that he does not drink alcohol or use illicit drugs.  ALLERGIES:  No Known Allergies  ROS: A comprehensive review of systems was negative except for: Musculoskeletal: positive for back pain  HOME MEDS: Current Outpatient Prescriptions  Medication Sig Dispense Refill  . allopurinol (ZYLOPRIM) 300 MG tablet Take 300 mg by mouth daily.    Marland Kitchen amLODipine (NORVASC) 10 MG tablet Take 10 mg by  mouth daily.    Marland Kitchen aspirin EC 81 MG tablet Take 81 mg by mouth daily.    . citalopram (CELEXA) 40 MG tablet Take 40 mg by mouth daily.    . furosemide (LASIX) 20 MG tablet Take 20 mg by mouth daily.    Marland Kitchen labetalol (NORMODYNE) 300 MG tablet Take 300 mg by mouth 3 (three) times daily.    Marland Kitchen losartan (COZAAR) 100 MG tablet Take 100 mg by mouth daily.    Marland Kitchen omeprazole (PRILOSEC) 40 MG capsule Take 40 mg by mouth daily.    . traMADol (ULTRAM) 50 MG tablet Take 1 tablet (50 mg total) by mouth every 6 (six) hours as needed. 15 tablet 0  . traZODone (DESYREL) 100 MG tablet Take 1 tablet by mouth at bedtime.    . valsartan (DIOVAN) 320 MG tablet Take 1 tablet by mouth daily.     No current facility-administered medications for this visit.    LABS/IMAGING: No results found for this or any previous visit (from the past 48 hour(s)). No results found.  WEIGHTS: Wt Readings from Last 3 Encounters:  07/18/15 237 lb 14.4 oz (107.911 kg)  06/07/14 240 lb (108.863 kg)  05/31/14 241 lb (109.317 kg)    VITALS: BP 136/88 mmHg  Pulse 67  Ht 5' 8.5" (1.74 m)  Wt 237 lb 14.4 oz (107.911 kg)  BMI 35.64 kg/m2  EXAM: General appearance: alert and no distress Neck: no carotid bruit, no JVD and thyroid not enlarged, symmetric, no tenderness/mass/nodules Lungs: clear to auscultation bilaterally Heart: regular  rate and rhythm, S1, S2 normal and systolic murmur: early systolic 3/6, crescendo at lower left sternal border Abdomen: soft, non-tender; bowel sounds normal; no masses,  no organomegaly Extremities: extremities normal, atraumatic, no cyanosis or edema Pulses: 2+ and symmetric Skin: Skin color, texture, turgor normal. No rashes or lesions Neurologic: Grossly normal Psych: Pleasant  EKG: Normal sinus rhythm at 67, nonspecific ST and T changes  ASSESSMENT: 1. Indeterminate preoperative risk 2. Murmur 3. Abnormal EKG with nonspecific ST and T changes 4. Mild dyspnea on exertion  PLAN: 1.   Mr.  Ehrman is at indeterminate risk for surgery. I do not think that is EKG represents ischemia and he is not currently having any chest pain symptoms. He does have a systolic murmur and this bears further investigation. I would recommend an echocardiogram to further evaluate this as well his LV function and wall motion. If this is essentially fairly normal or only shows mild valvular abnormalities, he will likely be at low risk for surgery. Hopefully we can get the study done in the next few days.  Thanks for the kind referral.  Pixie Casino, MD, [facility] Attending Cardiologist [place] 07/18/2015, 4:51 PM

## 2015-07-25 ENCOUNTER — Ambulatory Visit (HOSPITAL_COMMUNITY): Payer: Commercial Managed Care - HMO | Attending: Cardiovascular Disease

## 2015-07-25 ENCOUNTER — Other Ambulatory Visit: Payer: Self-pay

## 2015-07-25 DIAGNOSIS — I77810 Thoracic aortic ectasia: Secondary | ICD-10-CM | POA: Insufficient documentation

## 2015-07-25 DIAGNOSIS — I071 Rheumatic tricuspid insufficiency: Secondary | ICD-10-CM | POA: Insufficient documentation

## 2015-07-25 DIAGNOSIS — I350 Nonrheumatic aortic (valve) stenosis: Secondary | ICD-10-CM | POA: Insufficient documentation

## 2015-07-25 DIAGNOSIS — I517 Cardiomegaly: Secondary | ICD-10-CM | POA: Insufficient documentation

## 2015-07-25 DIAGNOSIS — I371 Nonrheumatic pulmonary valve insufficiency: Secondary | ICD-10-CM | POA: Diagnosis not present

## 2015-07-25 DIAGNOSIS — Z01810 Encounter for preprocedural cardiovascular examination: Secondary | ICD-10-CM

## 2015-07-25 DIAGNOSIS — K219 Gastro-esophageal reflux disease without esophagitis: Secondary | ICD-10-CM | POA: Diagnosis not present

## 2015-07-25 DIAGNOSIS — Z87891 Personal history of nicotine dependence: Secondary | ICD-10-CM | POA: Insufficient documentation

## 2015-07-25 DIAGNOSIS — R011 Cardiac murmur, unspecified: Secondary | ICD-10-CM | POA: Diagnosis not present

## 2015-07-26 ENCOUNTER — Encounter: Payer: Self-pay | Admitting: Internal Medicine

## 2015-08-02 ENCOUNTER — Telehealth: Payer: Self-pay | Admitting: Internal Medicine

## 2015-08-02 NOTE — Telephone Encounter (Signed)
Pt called in stating that he had an Echo done on 10/27 and he wanted to know if those results had been faxed over to Dr. Girtha Rm office for his clearance. Please f/u with pt   Thanks

## 2015-08-02 NOTE — Telephone Encounter (Signed)
Hand delivered copy of echo findings to [facility].  Pt aware results have been sent.

## 2015-08-05 ENCOUNTER — Telehealth: Payer: Self-pay | Admitting: Cardiology

## 2015-08-05 NOTE — Telephone Encounter (Signed)
Do not need this encounter °

## 2015-08-06 ENCOUNTER — Telehealth: Payer: Self-pay | Admitting: Internal Medicine

## 2015-08-06 NOTE — Telephone Encounter (Signed)
Pt called back in stating that Dr. Tonita Cong over at [facility] did not receive that records from his Echo clearing him for surgery. Please f/u with pt as soon as possible.  Thanks

## 2015-08-06 NOTE — Telephone Encounter (Signed)
Returned call to patient. Informed him on 11/4 Ovid Curd, RN walked over clearance.   Called [facility] - confirmed cardiac clearance has been received. Waiting on clearance from PCP.   Notified patient of this - he will contact PCP

## 2015-08-08 ENCOUNTER — Ambulatory Visit: Payer: Self-pay | Admitting: Orthopedic Surgery

## 2015-08-09 DIAGNOSIS — I10 Essential (primary) hypertension: Secondary | ICD-10-CM | POA: Diagnosis not present

## 2015-08-09 DIAGNOSIS — M5137 Other intervertebral disc degeneration, lumbosacral region: Secondary | ICD-10-CM | POA: Diagnosis not present

## 2015-08-09 DIAGNOSIS — F419 Anxiety disorder, unspecified: Secondary | ICD-10-CM | POA: Diagnosis not present

## 2015-08-09 DIAGNOSIS — G4700 Insomnia, unspecified: Secondary | ICD-10-CM | POA: Diagnosis not present

## 2015-08-09 DIAGNOSIS — M109 Gout, unspecified: Secondary | ICD-10-CM | POA: Diagnosis not present

## 2015-08-09 DIAGNOSIS — Z79899 Other long term (current) drug therapy: Secondary | ICD-10-CM | POA: Diagnosis not present

## 2015-08-09 DIAGNOSIS — K219 Gastro-esophageal reflux disease without esophagitis: Secondary | ICD-10-CM | POA: Diagnosis not present

## 2015-08-09 DIAGNOSIS — Z0001 Encounter for general adult medical examination with abnormal findings: Secondary | ICD-10-CM | POA: Diagnosis not present

## 2015-08-12 ENCOUNTER — Other Ambulatory Visit: Payer: Self-pay | Admitting: Family Medicine

## 2015-08-12 DIAGNOSIS — Z136 Encounter for screening for cardiovascular disorders: Secondary | ICD-10-CM

## 2015-08-14 ENCOUNTER — Ambulatory Visit: Payer: Commercial Managed Care - HMO

## 2015-08-19 ENCOUNTER — Ambulatory Visit
Admission: RE | Admit: 2015-08-19 | Discharge: 2015-08-19 | Disposition: A | Discharge status: Home / Self Care | Payer: Commercial Managed Care - HMO | Source: Ambulatory Visit | Attending: Family Medicine | Admitting: Family Medicine

## 2015-08-19 DIAGNOSIS — I714 Abdominal aortic aneurysm, without rupture: Secondary | ICD-10-CM | POA: Diagnosis not present

## 2015-08-19 DIAGNOSIS — Z136 Encounter for screening for cardiovascular disorders: Secondary | ICD-10-CM

## 2015-08-20 ENCOUNTER — Ambulatory Visit: Payer: Self-pay | Admitting: Orthopedic Surgery

## 2015-08-20 NOTE — H&P (Signed)
John Hanson is an 74 y.o. male.   Chief Complaint: back and leg pain HPI: The patient is a 74 year old male who presents with back pain. The patient is here today for a surgical consult and in referral from Dr. Nelva Bush. The patient reports low back symptoms which began year(s) ago following a specific injury. The injury occurred year(s) ago due to a fall. Symptoms are reported to be located in the low back and Symptoms include pain. The pain radiates to the left groin, left buttock, left posterior thigh, right groin, right buttock and right posterior thigh. The patient describes the severity of their symptoms as moderate to severe. The patient feels as if the symptoms are worsening. Symptoms are exacerbated by bending. Current treatment includes non-opioid analgesics and heating pad. Prior to being seen today the patient was previously evaluated by a primary physician, Dr. Louanne Skye and Dr. Nelva Bush. Past evaluation has included MRI of the lumbar spine. Past treatment has included non-opioid analgesics, opioid analgesics, corticosteroids, epidural injections and physical therapy.  He reports inability to ambulate. He gets pain and weakness into the legs bilaterally. He has had epidurals by Dr. Nelva Bush with temporary relief. He has reported significant difficulty and this has been worsening over the past three years.  REVIEW OF SYSTEMS Review of systems is negative for fevers, chest pain, shortness of breath, unexplained recent weight loss, loss of bowel or bladder function, burning with urination, joint swelling, rashes, weakness or numbness, difficulty with balance, easy bruising, excessive thirst or frequent urination.  He is taking Tramadol which does not help him. He has to lean forward and sit down for relief.  Past Medical History  Diagnosis Date  . Hypertension   . GERD (gastroesophageal reflux disease)   . Varicose veins     Past Surgical History  Procedure Laterality Date  . Endovenous  ablation saphenous vein w/ laser Right 05-31-2014    EVLA  right gretaer saphenous vein by Curt Jews MD    Family History  Problem Relation Age of Onset  . Kidney disease Mother    Social History:  reports that he has quit smoking. His smoking use included Cigars. He has never used smokeless tobacco. He reports that he does not drink alcohol or use illicit drugs.  Allergies: No Known Allergies   (Not in a hospital admission)  No results found for this or any previous visit (from the past 48 hour(s)). [place] Screening Aaa  08/19/2015  CLINICAL DATA:  Medicare screening exam for abdominal aortic aneurysm. EXAM: ABDOMINAL AORTA SCREENING ULTRASOUND TECHNIQUE: Ultrasound examination of the abdominal aorta was performed as a screening evaluation for abdominal aortic aneurysm. COMPARISON:  None. 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 Recommend followup by ultrasound in 5 years. This recommendation follows ACR consensus guidelines: White Paper of the ACR Incidental Findings Committee II on Vascular Findings. J Am Coll Radiol 2013; 10:789-794. Electronically Signed   By: Marcello Moores  Register   On: 08/19/2015 09:18    Review of Systems  Constitutional: Negative.   HENT: Negative.   Eyes: Negative.   Respiratory: Negative.   Cardiovascular: Negative.   Gastrointestinal: Negative.   Genitourinary: Negative.   Musculoskeletal: Positive for back pain.  Skin: Negative.   Neurological: Negative.   Psychiatric/Behavioral: Negative.     There were no vitals taken for this visit. Physical Exam  Constitutional: He  is oriented to person, place, and time. He appears well-developed.  HENT:  Head: Normocephalic.  Eyes: Pupils are equal, round, and reactive to light.  Neck: Normal range of motion.  Cardiovascular: Normal rate.   Respiratory:  Effort normal.  GI: Soft.  Musculoskeletal:  On exam, he is upright, in mild distress. Mood and affect is appropriate. He has pain with limited extension, relieved with forward flexion and straight leg raise produces buttock and thigh pain bilaterally. Has a quadriceps weakness bilaterally. He does have limitation in range of motion of his hips bilaterally, but no pain with range. No evidence of DVT. Pulses are intact and 1+ dorsalis pedis and posterior tibial pulses.   Neurological: He is alert and oriented to person, place, and time.  Skin: Skin is warm and dry.    MRI last year demonstrated severe stenosis, multifactorial at 3-4 and at 4-5, predominantly at 4-5. Facet hypertrophy, ligamentum flavum hypertrophy and congenitally short pedicles and epidural lipomatosis. He had fairly severe stenosis noted at 3-4 as well, moderate at T3 and at 5-1.  I reviewed his epidural steroid injections. They have helped in the past. Most recent one gave him only limited relief. He does have some back pain, but predominantly pain into the legs and buttock pain.  Three view x-rays, AP, lateral, flexion extension demonstrates multilevel disc degeneration, osteophytic spurring, nerve foraminal stenosis. No instability in flexion extension. He does have osteoarthrosis of his hips bilaterally, left greater than right.  Assessment/Plan 1. 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 3. 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 4. Gout. 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  Plan, recommendation and discussing: I had extensive discussion concerning his current pathology, relevant anatomy and treatment options. He does have claudication symptoms related to severe spinal stenosis. We did discuss options living with his symptoms as epidurals are no longer effective. Pharmacologic management which he indicates is not helping as well. Surgical option would be an option,  would be a decompression of 4-5, possibly at 4-5 and at 3-4.  I had an extensive discussion of the risks and benefits of the lumbar decompression with the patient including bleeding, infection, damage to neurovascular structures, epidural fibrosis, CSF leak requiring repair. We also discussed increase in pain, adjacent segment disease, recurrent disc herniation, need for future surgery including repeat decompression and/or fusion. We also discussed risks of postoperative hematoma, paralysis, anesthetic complications including DVT, PE, death, cardiopulmonary dysfunction. In addition, the perioperative and postoperative courses were discussed in detail including the rehabilitative time and return to functional activity and work. I provided the patient with an illustrated handout and utilized the appropriate surgical models.  He did indicate residual back pain related to his multilevel disc degenerations, osteoarthrosis of his hips. He does have gout and there is always a concern about precipitation of gout event following surgical intervention. He has no history of MRSA. He has not had surgery before. No heart attack or blood clot. We have discussed continued living with his symptoms, given prescription for Norco to be taken p.r.n. He would like to proceed with the procedure, specifically discussed thinning of the dura related to the severe compression over time. If a dural rent noted, that the application of a neural patch would be appropriate. One to two days in the hospital would be appropriate. Certainly, he has exhausted his conservative treatment to this point and again either living with his symptoms or consider decompression. Again, he would like to proceed with the  latter. We will commonly request preoperative clearance by Dr. Dorthy Cooler.  Plan microlumbar decompression L4-5, possible L3-4  Vaness Jelinski M. PA-C for Dr. Tonita Cong 08/20/2015, 5:04 PM

## 2015-08-29 ENCOUNTER — Encounter (INDEPENDENT_AMBULATORY_CARE_PROVIDER_SITE_OTHER): Payer: Self-pay

## 2015-08-29 ENCOUNTER — Encounter (HOSPITAL_COMMUNITY)
Admission: RE | Admit: 2015-08-29 | Discharge: 2015-08-29 | Disposition: A | Discharge status: Home / Self Care | Payer: Commercial Managed Care - HMO | Source: Ambulatory Visit | Attending: Specialist | Admitting: Specialist

## 2015-08-29 ENCOUNTER — Ambulatory Visit (HOSPITAL_COMMUNITY)
Admission: RE | Admit: 2015-08-29 | Discharge: 2015-08-29 | Disposition: A | Discharge status: Home / Self Care | Payer: Commercial Managed Care - HMO | Source: Ambulatory Visit | Attending: Orthopedic Surgery | Admitting: Orthopedic Surgery

## 2015-08-29 ENCOUNTER — Encounter (HOSPITAL_COMMUNITY): Payer: Self-pay

## 2015-08-29 DIAGNOSIS — M5136 Other intervertebral disc degeneration, lumbar region: Secondary | ICD-10-CM | POA: Diagnosis not present

## 2015-08-29 DIAGNOSIS — M160 Bilateral primary osteoarthritis of hip: Secondary | ICD-10-CM | POA: Diagnosis not present

## 2015-08-29 DIAGNOSIS — M4806 Spinal stenosis, lumbar region: Secondary | ICD-10-CM | POA: Diagnosis not present

## 2015-08-29 DIAGNOSIS — M48061 Spinal stenosis, lumbar region without neurogenic claudication: Secondary | ICD-10-CM

## 2015-08-29 HISTORY — DX: Anesthesia of skin: R20.2

## 2015-08-29 HISTORY — DX: Anesthesia of skin: R20.0

## 2015-08-29 HISTORY — DX: Unspecified osteoarthritis, unspecified site: M19.90

## 2015-08-29 HISTORY — DX: Sleep apnea, unspecified: G47.30

## 2015-08-29 LAB — BASIC METABOLIC PANEL
ANION GAP: 6 (ref 5–15)
BUN: 18 mg/dL (ref 6–20)
CHLORIDE: 105 mmol/L (ref 101–111)
CO2: 29 mmol/L (ref 22–32)
Calcium: 9.4 mg/dL (ref 8.9–10.3)
Creatinine, Ser: 1.14 mg/dL (ref 0.61–1.24)
GFR calc Af Amer: >60 mL/min (ref >60)
GFR calc non Af Amer: >60 mL/min (ref >60)
GLUCOSE: 103 mg/dL — AB (ref 65–99)
POTASSIUM: 4.2 mmol/L (ref 3.5–5.1)
Sodium: 140 mmol/L (ref 135–145)

## 2015-08-29 LAB — CBC
HEMATOCRIT: 36.7 % — AB (ref 39.0–52.0)
HEMOGLOBIN: 12.3 g/dL — AB (ref 13.0–17.0)
MCH: 32.8 pg (ref 26.0–34.0)
MCHC: 33.5 g/dL (ref 30.0–36.0)
MCV: 97.9 fL (ref 78.0–100.0)
Platelets: 227 10*3/uL (ref 150–400)
RBC: 3.75 MIL/uL — ABNORMAL LOW (ref 4.22–5.81)
RDW: 13.4 % (ref 11.5–15.5)
WBC: 5.9 10*3/uL (ref 4.0–10.5)

## 2015-08-29 LAB — SURGICAL PCR SCREEN
MRSA, PCR: NEGATIVE
STAPHYLOCOCCUS AUREUS: NEGATIVE

## 2015-08-29 NOTE — Patient Instructions (Signed)
John Hanson  08/29/2015   Your procedure is scheduled on: Wednesday September 04, 2015   Report to [facility]  Entrance take [place]  elevators to 3rd floor to  [place] at 6:30 AM.  Call this number if you have problems the morning of surgery [phone]   Remember: ONLY 1 PERSON MAY GO WITH YOU TO SHORT STAY TO GET  READY MORNING OF [name]  Do not eat food or drink liquids :After Midnight.     Take these medicines the morning of surgery with A SIP OF WATER: Allopurinol; Amlodipine (Norvasc); Citalopram (Celexa); Labetalol (Normodyne); Omeprazole (Prilosec)                               You may not have any metal on your body including hair pins and              piercings  Do not wear jewelry, lotions, powders or colognes, deodorant                           Men may shave face and neck.   Do not bring valuables to the hospital. [name]  Contacts, dentures or bridgework may not be worn into surgery.  Leave suitcase in the car. After surgery it may be brought to your room.               Please read over the following fact sheets you were given:INCENTIVE SPIROMETER _____________________________________________________________________             [facility] - Preparing for Surgery Before surgery, you can play an important role.  Because skin is not sterile, your skin needs to be as free of germs as possible.  You can reduce the number of germs on your skin by washing with CHG (chlorahexidine gluconate) soap before surgery.  CHG is an antiseptic cleaner which kills germs and bonds with the skin to continue killing germs even after washing. Please DO NOT use if you have an allergy to CHG or antibacterial soaps.  If your skin becomes reddened/irritated stop using the CHG and inform your nurse when you arrive at Short Stay. Do not shave (including legs and underarms) for at least 48 hours  prior to the first CHG shower.  You may shave your face/neck. Please follow these instructions carefully:  1.  Shower with CHG Soap the night before surgery and the  morning of Surgery.  2.  If you choose to wash your hair, wash your hair first as usual with your  normal  shampoo.  3.  After you shampoo, rinse your hair and body thoroughly to remove the  shampoo.                           4.  Use CHG as you would any other liquid soap.  You can apply chg directly  to the skin and wash                       Gently with a scrungie or clean washcloth.  5.  Apply the CHG Soap to your body ONLY FROM THE  NECK DOWN.   Do not use on face/ open                           Wound or open sores. Avoid contact with eyes, ears mouth and genitals (private parts).                       Wash face,  Genitals (private parts) with your normal soap.             6.  Wash thoroughly, paying special attention to the area where your surgery  will be performed.  7.  Thoroughly rinse your body with warm water from the neck down.  8.  DO NOT shower/wash with your normal soap after using and rinsing off  the CHG Soap.                9.  Pat yourself dry with a clean towel.            10.  Wear clean pajamas.            11.  Place clean sheets on your bed the night of your first shower and do not  sleep with pets. Day of Surgery : Do not apply any lotions/deodorants the morning of surgery.  Please wear clean clothes to the hospital/surgery center.  FAILURE TO FOLLOW THESE INSTRUCTIONS MAY RESULT IN THE CANCELLATION OF YOUR SURGERY PATIENT SIGNATURE_________________________________  NURSE SIGNATURE__________________________________  ________________________________________________________________________   John Hanson  An incentive spirometer is a tool that can help keep your lungs clear and active. This tool measures how well you are filling your lungs with each breath. Taking long deep breaths may help reverse  or decrease the chance of developing breathing (pulmonary) problems (especially infection) following:  A long period of time when you are unable to move or be active. BEFORE THE PROCEDURE   If the spirometer includes an indicator to show your best effort, your nurse or respiratory therapist will set it to a desired goal.  If possible, sit up straight or lean slightly forward. Try not to slouch.  Hold the incentive spirometer in an upright position. INSTRUCTIONS FOR USE  1. Sit on the edge of your bed if possible, or sit up as far as you can in bed or on a chair. 2. Hold the incentive spirometer in an upright position. 3. Breathe out normally. 4. Place the mouthpiece in your mouth and seal your lips tightly around it. 5. Breathe in slowly and as deeply as possible, raising the piston or the ball toward the top of the column. 6. Hold your breath for 3-5 seconds or for as long as possible. Allow the piston or ball to fall to the bottom of the column. 7. Remove the mouthpiece from your mouth and breathe out normally. 8. Rest for a few seconds and repeat Steps 1 through 7 at least 10 times every 1-2 hours when you are awake. Take your time and take a few normal breaths between deep breaths. 9. The spirometer may include an indicator to show your best effort. Use the indicator as a goal to work toward during each repetition. 10. After each set of 10 deep breaths, practice coughing to be sure your lungs are clear. If you have an incision (the cut made at the time of surgery), support your incision when coughing by placing a pillow or rolled up towels firmly against it. Once you are able  to get out of bed, walk around indoors and cough well. You may stop using the incentive spirometer when instructed by your caregiver.  RISKS AND COMPLICATIONS  Take your time so you do not get dizzy or light-headed.  If you are in pain, you may need to take or ask for pain medication before doing incentive  spirometry. It is harder to take a deep breath if you are having pain. AFTER USE  Rest and breathe slowly and easily.  It can be helpful to keep track of a log of your progress. Your caregiver can provide you with a simple table to help with this. If you are using the spirometer at home, follow these instructions: [place] IF:   You are having difficultly using the spirometer.  You have trouble using the spirometer as often as instructed.  Your pain medication is not giving enough relief while using the spirometer.  You develop fever of 100.5 F (38.1 C) or higher. SEEK IMMEDIATE MEDICAL CARE IF:   You cough up bloody sputum that had not been present before.  You develop fever of 102 F (38.9 C) or greater.  You develop worsening pain at or near the incision site. MAKE SURE YOU:   Understand these instructions.  Will watch your condition.  Will get help right away if you are not doing well or get worse. Document Released: 01/25/2007 Document Revised: 12/07/2011 Document Reviewed: 03/28/2007 [facility] Patient Information 2014 [place], [place].   ________________________________________________________________________

## 2015-08-29 NOTE — Progress Notes (Addendum)
CXR epic 06/14/2015 EKG epic 07/18/2015 ECHO epic 07/25/2015  Clearance per Dr Debara Pickett / cardiology (discussed with ECHO results 07/25/2015  LOV note cardiology/epic 07/08/2015

## 2015-08-30 ENCOUNTER — Other Ambulatory Visit (HOSPITAL_COMMUNITY): Payer: Commercial Managed Care - HMO

## 2015-09-04 ENCOUNTER — Ambulatory Visit (HOSPITAL_COMMUNITY)
Admission: RE | Admit: 2015-09-04 | Discharge: 2015-09-06 | Disposition: A | Discharge status: Home / Self Care | Payer: Commercial Managed Care - HMO | Source: Ambulatory Visit | Attending: Specialist | Admitting: Specialist

## 2015-09-04 ENCOUNTER — Ambulatory Visit (HOSPITAL_COMMUNITY): Payer: Commercial Managed Care - HMO

## 2015-09-04 ENCOUNTER — Encounter (HOSPITAL_COMMUNITY): Payer: Self-pay | Admitting: *Deleted

## 2015-09-04 ENCOUNTER — Encounter (HOSPITAL_COMMUNITY)
Admission: RE | Disposition: A | Discharge status: Home / Self Care | Payer: Self-pay | Source: Ambulatory Visit | Attending: Specialist

## 2015-09-04 ENCOUNTER — Ambulatory Visit (HOSPITAL_COMMUNITY): Payer: Commercial Managed Care - HMO | Admitting: Anesthesiology

## 2015-09-04 DIAGNOSIS — M109 Gout, unspecified: Secondary | ICD-10-CM | POA: Diagnosis not present

## 2015-09-04 DIAGNOSIS — I350 Nonrheumatic aortic (valve) stenosis: Secondary | ICD-10-CM | POA: Diagnosis not present

## 2015-09-04 DIAGNOSIS — M5136 Other intervertebral disc degeneration, lumbar region: Secondary | ICD-10-CM | POA: Diagnosis not present

## 2015-09-04 DIAGNOSIS — Z87891 Personal history of nicotine dependence: Secondary | ICD-10-CM | POA: Insufficient documentation

## 2015-09-04 DIAGNOSIS — Z6836 Body mass index (BMI) 36.0-36.9, adult: Secondary | ICD-10-CM | POA: Insufficient documentation

## 2015-09-04 DIAGNOSIS — M4806 Spinal stenosis, lumbar region: Secondary | ICD-10-CM | POA: Insufficient documentation

## 2015-09-04 DIAGNOSIS — Z79899 Other long term (current) drug therapy: Secondary | ICD-10-CM | POA: Insufficient documentation

## 2015-09-04 DIAGNOSIS — I10 Essential (primary) hypertension: Secondary | ICD-10-CM | POA: Insufficient documentation

## 2015-09-04 DIAGNOSIS — Z9889 Other specified postprocedural states: Secondary | ICD-10-CM | POA: Diagnosis not present

## 2015-09-04 DIAGNOSIS — K219 Gastro-esophageal reflux disease without esophagitis: Secondary | ICD-10-CM | POA: Diagnosis not present

## 2015-09-04 DIAGNOSIS — M160 Bilateral primary osteoarthritis of hip: Secondary | ICD-10-CM | POA: Diagnosis not present

## 2015-09-04 DIAGNOSIS — G4730 Sleep apnea, unspecified: Secondary | ICD-10-CM | POA: Diagnosis not present

## 2015-09-04 DIAGNOSIS — M48061 Spinal stenosis, lumbar region without neurogenic claudication: Secondary | ICD-10-CM | POA: Diagnosis present

## 2015-09-04 DIAGNOSIS — Z419 Encounter for procedure for purposes other than remedying health state, unspecified: Secondary | ICD-10-CM

## 2015-09-04 DIAGNOSIS — M47816 Spondylosis without myelopathy or radiculopathy, lumbar region: Secondary | ICD-10-CM | POA: Diagnosis not present

## 2015-09-04 HISTORY — PX: LUMBAR LAMINECTOMY/DECOMPRESSION MICRODISCECTOMY: SHX5026

## 2015-09-04 SURGERY — LUMBAR LAMINECTOMY/DECOMPRESSION MICRODISCECTOMY 1 LEVEL
Anesthesia: General | Site: Back

## 2015-09-04 MED ORDER — IRBESARTAN 300 MG PO TABS
300.0000 mg | ORAL_TABLET | Freq: Every day | ORAL | Status: DC
  Administered 2015-09-04 – 2015-09-06 (×3): 300 mg via ORAL
  Filled 2015-09-04 (×3): qty 1

## 2015-09-04 MED ORDER — PHENOL 1.4 % MT LIQD: 1.0000 | OROMUCOSAL | Status: DC | PRN

## 2015-09-04 MED ORDER — MEPERIDINE HCL 50 MG/ML IJ SOLN: 6.2500 mg | INTRAMUSCULAR | Status: DC | PRN

## 2015-09-04 MED ORDER — THROMBIN 5000 UNITS EX SOLR
OROMUCOSAL | Status: DC | PRN
  Administered 2015-09-04 (×3): via TOPICAL

## 2015-09-04 MED ORDER — FENTANYL CITRATE (PF) 250 MCG/5ML IJ SOLN
INTRAMUSCULAR | Status: AC
  Filled 2015-09-04: qty 5

## 2015-09-04 MED ORDER — ONDANSETRON HCL 4 MG/2ML IJ SOLN: 4.0000 mg | INTRAMUSCULAR | Status: DC | PRN

## 2015-09-04 MED ORDER — RISAQUAD PO CAPS
1.0000 | ORAL_CAPSULE | Freq: Every day | ORAL | Status: DC
  Administered 2015-09-04 – 2015-09-06 (×3): 1 via ORAL
  Filled 2015-09-04 (×3): qty 1

## 2015-09-04 MED ORDER — MIDAZOLAM HCL 2 MG/2ML IJ SOLN: 0.5000 mg | Freq: Once | INTRAMUSCULAR | Status: DC | PRN

## 2015-09-04 MED ORDER — LABETALOL HCL 300 MG PO TABS
300.0000 mg | ORAL_TABLET | Freq: Once | ORAL | Status: AC
  Administered 2015-09-04: 300 mg via ORAL
  Filled 2015-09-04: qty 1

## 2015-09-04 MED ORDER — PANTOPRAZOLE SODIUM 40 MG PO TBEC
40.0000 mg | DELAYED_RELEASE_TABLET | Freq: Every day | ORAL | Status: DC
  Administered 2015-09-04 – 2015-09-06 (×3): 40 mg via ORAL
  Filled 2015-09-04 (×3): qty 1

## 2015-09-04 MED ORDER — HYDROMORPHONE HCL 1 MG/ML IJ SOLN
INTRAMUSCULAR | Status: AC
  Filled 2015-09-04: qty 1

## 2015-09-04 MED ORDER — SENNOSIDES-DOCUSATE SODIUM 8.6-50 MG PO TABS: 1.0000 | ORAL_TABLET | Freq: Every evening | ORAL | Status: DC | PRN

## 2015-09-04 MED ORDER — ALLOPURINOL 300 MG PO TABS
300.0000 mg | ORAL_TABLET | Freq: Every day | ORAL | Status: DC
  Administered 2015-09-04 – 2015-09-06 (×3): 300 mg via ORAL
  Filled 2015-09-04 (×3): qty 1

## 2015-09-04 MED ORDER — AMLODIPINE BESYLATE 10 MG PO TABS
10.0000 mg | ORAL_TABLET | Freq: Every day | ORAL | Status: DC
  Administered 2015-09-04 – 2015-09-06 (×3): 10 mg via ORAL
  Filled 2015-09-04 (×3): qty 1

## 2015-09-04 MED ORDER — OXYCODONE-ACETAMINOPHEN 5-325 MG PO TABS: 1.0000 | ORAL_TABLET | ORAL | Status: DC | PRN

## 2015-09-04 MED ORDER — SUGAMMADEX SODIUM 500 MG/5ML IV SOLN
INTRAVENOUS | Status: AC
  Filled 2015-09-04: qty 5

## 2015-09-04 MED ORDER — PROMETHAZINE HCL 25 MG/ML IJ SOLN: 6.2500 mg | INTRAMUSCULAR | Status: DC | PRN

## 2015-09-04 MED ORDER — LIDOCAINE HCL (CARDIAC) 20 MG/ML IV SOLN
INTRAVENOUS | Status: DC | PRN
  Administered 2015-09-04: 60 mg via INTRAVENOUS
  Administered 2015-09-04: 40 mg via INTRAVENOUS

## 2015-09-04 MED ORDER — LABETALOL HCL 300 MG PO TABS
300.0000 mg | ORAL_TABLET | Freq: Three times a day (TID) | ORAL | Status: DC
  Administered 2015-09-04 – 2015-09-06 (×6): 300 mg via ORAL
  Filled 2015-09-04 (×8): qty 1

## 2015-09-04 MED ORDER — ACETAMINOPHEN 650 MG RE SUPP: 650.0000 mg | RECTAL | Status: DC | PRN

## 2015-09-04 MED ORDER — CEFAZOLIN SODIUM-DEXTROSE 2-3 GM-% IV SOLR
2.0000 g | INTRAVENOUS | Status: AC
  Administered 2015-09-04: 2 g via INTRAVENOUS

## 2015-09-04 MED ORDER — BUPIVACAINE-EPINEPHRINE 0.5% -1:200000 IJ SOLN
INTRAMUSCULAR | Status: DC | PRN
  Administered 2015-09-04: 17 mL

## 2015-09-04 MED ORDER — PROPOFOL 10 MG/ML IV BOLUS
INTRAVENOUS | Status: DC | PRN
  Administered 2015-09-04: 40 mg via INTRAVENOUS
  Administered 2015-09-04: 120 mg via INTRAVENOUS

## 2015-09-04 MED ORDER — LACTATED RINGERS IV SOLN
INTRAVENOUS | Status: DC
  Administered 2015-09-04 (×3): via INTRAVENOUS

## 2015-09-04 MED ORDER — METHOCARBAMOL 500 MG PO TABS
500.0000 mg | ORAL_TABLET | Freq: Four times a day (QID) | ORAL | Status: DC | PRN
  Administered 2015-09-04 – 2015-09-06 (×3): 500 mg via ORAL
  Filled 2015-09-04 (×3): qty 1

## 2015-09-04 MED ORDER — SUGAMMADEX SODIUM 500 MG/5ML IV SOLN
INTRAVENOUS | Status: DC | PRN
  Administered 2015-09-04: 300 mg via INTRAVENOUS

## 2015-09-04 MED ORDER — BISACODYL 5 MG PO TBEC: 5.0000 mg | DELAYED_RELEASE_TABLET | Freq: Every day | ORAL | Status: DC | PRN

## 2015-09-04 MED ORDER — KCL IN DEXTROSE-NACL 20-5-0.45 MEQ/L-%-% IV SOLN
INTRAVENOUS | Status: AC
  Administered 2015-09-04: 15:00:00 via INTRAVENOUS
  Filled 2015-09-04 (×3): qty 1000

## 2015-09-04 MED ORDER — DOCUSATE SODIUM 100 MG PO CAPS
100.0000 mg | ORAL_CAPSULE | Freq: Two times a day (BID) | ORAL | Status: DC
  Administered 2015-09-04 – 2015-09-06 (×4): 100 mg via ORAL

## 2015-09-04 MED ORDER — DEXTROSE 5 % IV SOLN
500.0000 mg | Freq: Four times a day (QID) | INTRAVENOUS | Status: DC | PRN
  Administered 2015-09-04: 500 mg via INTRAVENOUS
  Filled 2015-09-04 (×2): qty 5

## 2015-09-04 MED ORDER — SODIUM CHLORIDE 0.9 % IJ SOLN
INTRAMUSCULAR | Status: AC
  Filled 2015-09-04: qty 10

## 2015-09-04 MED ORDER — LABETALOL HCL 5 MG/ML IV SOLN
INTRAVENOUS | Status: DC | PRN
  Administered 2015-09-04 (×4): 5 mg via INTRAVENOUS

## 2015-09-04 MED ORDER — PROPOFOL 10 MG/ML IV BOLUS
INTRAVENOUS | Status: AC
  Filled 2015-09-04: qty 20

## 2015-09-04 MED ORDER — ALUM & MAG HYDROXIDE-SIMETH 200-200-20 MG/5ML PO SUSP: 30.0000 mL | Freq: Four times a day (QID) | ORAL | Status: DC | PRN

## 2015-09-04 MED ORDER — MAGNESIUM CITRATE PO SOLN: 1.0000 | Freq: Once | ORAL | Status: DC | PRN

## 2015-09-04 MED ORDER — THROMBIN 5000 UNITS EX SOLR
CUTANEOUS | Status: AC
  Filled 2015-09-04: qty 5000

## 2015-09-04 MED ORDER — ROCURONIUM BROMIDE 100 MG/10ML IV SOLN
INTRAVENOUS | Status: DC | PRN
  Administered 2015-09-04 (×3): 5 mg via INTRAVENOUS
  Administered 2015-09-04: 10 mg via INTRAVENOUS
  Administered 2015-09-04: 50 mg via INTRAVENOUS

## 2015-09-04 MED ORDER — MENTHOL 3 MG MT LOZG: 1.0000 | LOZENGE | OROMUCOSAL | Status: DC | PRN

## 2015-09-04 MED ORDER — ACETAMINOPHEN 325 MG PO TABS
650.0000 mg | ORAL_TABLET | ORAL | Status: DC | PRN
  Administered 2015-09-05: 650 mg via ORAL
  Filled 2015-09-04: qty 2

## 2015-09-04 MED ORDER — EPHEDRINE SULFATE 50 MG/ML IJ SOLN
INTRAMUSCULAR | Status: AC
  Filled 2015-09-04: qty 1

## 2015-09-04 MED ORDER — ONDANSETRON HCL 4 MG/2ML IJ SOLN
INTRAMUSCULAR | Status: AC
  Filled 2015-09-04: qty 2

## 2015-09-04 MED ORDER — FENTANYL CITRATE (PF) 100 MCG/2ML IJ SOLN
INTRAMUSCULAR | Status: DC | PRN
  Administered 2015-09-04: 50 ug via INTRAVENOUS
  Administered 2015-09-04: 100 ug via INTRAVENOUS
  Administered 2015-09-04 (×2): 50 ug via INTRAVENOUS

## 2015-09-04 MED ORDER — HYDROCODONE-ACETAMINOPHEN 5-325 MG PO TABS
1.0000 | ORAL_TABLET | ORAL | Status: DC | PRN
  Administered 2015-09-04 – 2015-09-06 (×2): 2 via ORAL
  Filled 2015-09-04 (×3): qty 2

## 2015-09-04 MED ORDER — BUPIVACAINE-EPINEPHRINE (PF) 0.5% -1:200000 IJ SOLN
INTRAMUSCULAR | Status: AC
  Filled 2015-09-04: qty 30

## 2015-09-04 MED ORDER — HYDROMORPHONE HCL 1 MG/ML IJ SOLN
0.2500 mg | INTRAMUSCULAR | Status: DC | PRN
  Administered 2015-09-04 (×2): 0.5 mg via INTRAVENOUS

## 2015-09-04 MED ORDER — TRAZODONE HCL 100 MG PO TABS
100.0000 mg | ORAL_TABLET | Freq: Every day | ORAL | Status: DC
  Administered 2015-09-04 – 2015-09-05 (×2): 100 mg via ORAL
  Filled 2015-09-04 (×3): qty 1

## 2015-09-04 MED ORDER — ONDANSETRON HCL 4 MG/2ML IJ SOLN
INTRAMUSCULAR | Status: DC | PRN
  Administered 2015-09-04: 4 mg via INTRAVENOUS

## 2015-09-04 MED ORDER — CEFAZOLIN SODIUM-DEXTROSE 2-3 GM-% IV SOLR
2.0000 g | Freq: Three times a day (TID) | INTRAVENOUS | Status: AC
  Administered 2015-09-04 – 2015-09-05 (×3): 2 g via INTRAVENOUS
  Filled 2015-09-04 (×3): qty 50

## 2015-09-04 MED ORDER — LIDOCAINE HCL (CARDIAC) 20 MG/ML IV SOLN
INTRAVENOUS | Status: AC
  Filled 2015-09-04: qty 5

## 2015-09-04 MED ORDER — HYDROMORPHONE HCL 1 MG/ML IJ SOLN
0.5000 mg | INTRAMUSCULAR | Status: DC | PRN
  Administered 2015-09-04: 1 mg via INTRAVENOUS
  Filled 2015-09-04: qty 1

## 2015-09-04 MED ORDER — ROCURONIUM BROMIDE 100 MG/10ML IV SOLN
INTRAVENOUS | Status: AC
  Filled 2015-09-04: qty 1

## 2015-09-04 MED ORDER — CITALOPRAM HYDROBROMIDE 40 MG PO TABS
40.0000 mg | ORAL_TABLET | Freq: Every day | ORAL | Status: DC
  Administered 2015-09-04 – 2015-09-06 (×3): 40 mg via ORAL
  Filled 2015-09-04 (×3): qty 1

## 2015-09-04 MED ORDER — OXYCODONE-ACETAMINOPHEN 5-325 MG PO TABS
1.0000 | ORAL_TABLET | ORAL | Status: DC | PRN
  Administered 2015-09-04 – 2015-09-06 (×7): 2 via ORAL
  Filled 2015-09-04 (×7): qty 2

## 2015-09-04 MED ORDER — SODIUM CHLORIDE 0.9 % IR SOLN
Status: AC
  Filled 2015-09-04: qty 1

## 2015-09-04 MED ORDER — CEFAZOLIN SODIUM-DEXTROSE 2-3 GM-% IV SOLR
INTRAVENOUS | Status: AC
  Filled 2015-09-04: qty 50

## 2015-09-04 MED ORDER — SODIUM CHLORIDE 0.9 % IR SOLN
Status: DC | PRN
  Administered 2015-09-04: 500 mL

## 2015-09-04 MED ORDER — DOCUSATE SODIUM 100 MG PO CAPS: 100.0000 mg | ORAL_CAPSULE | Freq: Two times a day (BID) | ORAL | Status: DC | PRN

## 2015-09-04 MED ORDER — THROMBIN 5000 UNITS EX SOLR
CUTANEOUS | Status: AC
  Filled 2015-09-04: qty 10000

## 2015-09-04 SURGICAL SUPPLY — 54 items
BAG SPEC THK2 15X12 ZIP CLS (MISCELLANEOUS)
BAG ZIPLOCK 12X15 (MISCELLANEOUS) IMPLANT
CLEANER TIP ELECTROSURG 2X2 (MISCELLANEOUS) ×3 IMPLANT
CLOSURE WOUND 1/2 X4 (GAUZE/BANDAGES/DRESSINGS) ×1
CLOTH 2% CHLOROHEXIDINE 3PK (PERSONAL CARE ITEMS) ×3 IMPLANT
DRAPE MICROSCOPE LEICA (MISCELLANEOUS) ×3 IMPLANT
DRAPE POUCH INSTRU U-SHP 10X18 (DRAPES) ×3 IMPLANT
DRAPE SHEET LG 3/4 BI-LAMINATE (DRAPES) ×3 IMPLANT
DRAPE SURG 17X11 SM STRL (DRAPES) ×3 IMPLANT
DRAPE UTILITY XL STRL (DRAPES) ×3 IMPLANT
DRSG AQUACEL AG ADV 3.5X 4 (GAUZE/BANDAGES/DRESSINGS) IMPLANT
DRSG AQUACEL AG ADV 3.5X 6 (GAUZE/BANDAGES/DRESSINGS) IMPLANT
DURAPREP 26ML APPLICATOR (WOUND CARE) ×3 IMPLANT
DURASEAL SPINE SEALANT 3ML (MISCELLANEOUS) IMPLANT
ELECT BLADE TIP CTD 4 INCH (ELECTRODE) ×3 IMPLANT
ELECT REM PT RETURN 9FT ADLT (ELECTROSURGICAL) ×3
ELECTRODE REM PT RTRN 9FT ADLT (ELECTROSURGICAL) ×1 IMPLANT
GAUZE SPONGE 4X4 12PLY STRL (GAUZE/BANDAGES/DRESSINGS) ×3 IMPLANT
GLOVE BIOGEL PI IND STRL 7.0 (GLOVE) ×1 IMPLANT
GLOVE BIOGEL PI INDICATOR 7.0 (GLOVE) ×2
GLOVE SURG SS PI 7.0 STRL IVOR (GLOVE) ×3 IMPLANT
GLOVE SURG SS PI 7.5 STRL IVOR (GLOVE) ×3 IMPLANT
GLOVE SURG SS PI 8.0 STRL IVOR (GLOVE) ×6 IMPLANT
GOWN STRL REUS W/TWL XL LVL3 (GOWN DISPOSABLE) ×6 IMPLANT
IV CATH 14GX2 1/4 (CATHETERS) ×3 IMPLANT
KIT BASIN OR (CUSTOM PROCEDURE TRAY) ×3 IMPLANT
KIT POSITIONING SURG ANDREWS (MISCELLANEOUS) ×3 IMPLANT
MANIFOLD NEPTUNE II (INSTRUMENTS) ×3 IMPLANT
NEEDLE SPNL 18GX3.5 QUINCKE PK (NEEDLE) ×6 IMPLANT
PACK LAMINECTOMY ORTHO (CUSTOM PROCEDURE TRAY) ×3 IMPLANT
PAD ABD 8X10 STRL (GAUZE/BANDAGES/DRESSINGS) ×3 IMPLANT
PATTIES SURGICAL .5 X.5 (GAUZE/BANDAGES/DRESSINGS) IMPLANT
PATTIES SURGICAL .75X.75 (GAUZE/BANDAGES/DRESSINGS) ×3 IMPLANT
PATTIES SURGICAL 1X1 (DISPOSABLE) IMPLANT
RUBBERBAND STERILE (MISCELLANEOUS) ×6 IMPLANT
SPONGE LAP 4X18 X RAY DECT (DISPOSABLE) ×3 IMPLANT
SPONGE SURGIFOAM ABS GEL 100 (HEMOSTASIS) ×3 IMPLANT
STAPLER VISISTAT (STAPLE) ×3 IMPLANT
STRIP CLOSURE SKIN 1/2X4 (GAUZE/BANDAGES/DRESSINGS) ×2 IMPLANT
SUT NURALON 4 0 TR CR/8 (SUTURE) IMPLANT
SUT PROLENE 3 0 PS 2 (SUTURE) ×3 IMPLANT
SUT VIC AB 1 CT1 27 (SUTURE)
SUT VIC AB 1 CT1 27XBRD ANTBC (SUTURE) IMPLANT
SUT VIC AB 1 CT1 36 (SUTURE) ×6 IMPLANT
SUT VIC AB 1-0 CT2 27 (SUTURE) ×3 IMPLANT
SUT VIC AB 2-0 CT1 27 (SUTURE) ×3
SUT VIC AB 2-0 CT1 TAPERPNT 27 (SUTURE) ×1 IMPLANT
SUT VIC AB 2-0 CT2 27 (SUTURE) ×3 IMPLANT
SYR 3ML LL SCALE MARK (SYRINGE) ×3 IMPLANT
TAPE CLOTH SURG 4X10 WHT LF (GAUZE/BANDAGES/DRESSINGS) ×3 IMPLANT
TOWEL OR 17X26 10 PK STRL BLUE (TOWEL DISPOSABLE) ×3 IMPLANT
TOWEL OR NON WOVEN STRL DISP B (DISPOSABLE) ×3 IMPLANT
TRAY FOLEY W/METER SILVER 16FR (SET/KITS/TRAYS/PACK) ×3 IMPLANT
YANKAUER SUCT BULB TIP NO VENT (SUCTIONS) ×3 IMPLANT

## 2015-09-04 NOTE — Anesthesia Preprocedure Evaluation (Addendum)
Anesthesia Evaluation  Patient identified by MRN, date of birth, ID band Patient awake    Reviewed: Allergy & Precautions, NPO status , Patient's Chart, lab work & pertinent test results  History of Anesthesia Complications Negative for: history of anesthetic complications  Airway Mallampati: II  TM Distance: >3 FB Neck ROM: Full    Dental  (+) Poor Dentition, Chipped, Missing, Dental Advisory Given   Pulmonary sleep apnea (does not use CPAP) , former smoker (quit 1970),    breath sounds clear to auscultation       Cardiovascular hypertension, Pt. on medications and Pt. on home beta blockers (-) angina Rhythm:Regular Rate:Normal  10/16 ECHO: low normal LV function - calcified aortic valve with very mild stenosis    Neuro/Psych Chronic back pain: narcotics    GI/Hepatic Neg liver ROS, GERD  Medicated and Controlled,  Endo/Other  Morbid obesity  Renal/GU negative Renal ROS     Musculoskeletal  (+) Arthritis ,   Abdominal (+) + obese,   Peds  Hematology negative hematology ROS (+)   Anesthesia Other Findings   Reproductive/Obstetrics                           Anesthesia Physical Anesthesia Plan  ASA: III  Anesthesia Plan: General   Post-op Pain Management:    Induction: Intravenous  Airway Management Planned: Oral ETT  Additional Equipment:   Intra-op Plan:   Post-operative Plan: Extubation in OR  Informed Consent: I have reviewed the patients History and Physical, chart, labs and discussed the procedure including the risks, benefits and alternatives for the proposed anesthesia with the patient or authorized representative who has indicated his/her understanding and acceptance.   Dental advisory given  Plan Discussed with: CRNA and Surgeon  Anesthesia Plan Comments: (Plan routine monitors, GETA)       Anesthesia Quick Evaluation

## 2015-09-04 NOTE — Discharge Instructions (Signed)
Walk As Tolerated utilizing back precautions.  No bending, twisting, or lifting.  No driving for 2 weeks.   °Aquacel dressing may remain in place until follow up. May shower with aquacel dressing in place. If the dressing peels off or becomes saturated, you may remove aquacel dressing and place gauze and tape dressing which should be kept clean and dry and changed daily. Do not remove steri-strips if they are present. °See Dr. Gaspard Isbell in office in 10 to 14 days. Begin taking aspirin 81mg per day starting 4 days after your surgery if not allergic to aspirin or on another blood thinner. °Walk daily even outside. Use a cane or walker only if necessary. °Avoid sitting on soft sofas. ° °

## 2015-09-04 NOTE — Anesthesia Postprocedure Evaluation (Signed)
Anesthesia Post Note  Patient: John Hanson  Procedure(s) Performed: Procedure(s) (LRB): MICRO LUMBAR DECOMPRESSION L4 - L5,  L3 - L4 2 LEVELS (N/A)  Patient location during evaluation: PACU Anesthesia Type: General Level of consciousness: awake and alert, oriented and patient cooperative Pain management: pain level controlled Vital Signs Assessment: post-procedure vital signs reviewed and stable Respiratory status: spontaneous breathing, nonlabored ventilation and respiratory function stable Cardiovascular status: blood pressure returned to baseline and stable Postop Assessment: no signs of nausea or vomiting Anesthetic complications: no    Last Vitals:  Filed Vitals:   09/04/15 1450 09/04/15 1552  BP: 160/82 177/92  Pulse: 77 77  Temp: 36.8 C 36.9 C  Resp: 17 17    Last Pain:  Filed Vitals:   09/04/15 1553  PainSc: 3                  Alyze Lauf,E. [place] Recupero

## 2015-09-04 NOTE — Interval H&P Note (Signed)
History and Physical Interval Note:  09/04/2015 8:28 AM  John Hanson  has presented today for surgery, with the diagnosis of lumbar stenosis  The various methods of treatment have been discussed with the patient and family. After consideration of risks, benefits and other options for treatment, the patient has consented to  Procedure(s): MICRO LUMBAR DECOMPRESSION L4 - L5, POSSIBLE L3 - L4 1 LEVEL (N/A) as a surgical intervention .  The patient's history has been reviewed, patient examined, no change in status, stable for surgery.  I have reviewed the patient's chart and labs.  Questions were answered to the patient's satisfaction.     Shayanne Gomm C

## 2015-09-04 NOTE — Anesthesia Procedure Notes (Signed)
Procedure Name: Intubation Date/Time: 09/04/2015 8:43 AM Performed by: Glory Buff Pre-anesthesia Checklist: Patient identified, Emergency Drugs available, Suction available and Patient being monitored Patient Re-evaluated:Patient Re-evaluated prior to inductionOxygen Delivery Method: Circle System Utilized Preoxygenation: Pre-oxygenation with 100% oxygen Intubation Type: IV induction Ventilation: Mask ventilation without difficulty Laryngoscope Size: Miller and 3 Grade View: Grade I Tube type: Oral Tube size: 7.5 mm Number of attempts: 1 Airway Equipment and Method: Stylet and Oral airway Placement Confirmation: ETT inserted through vocal cords under direct vision,  positive ETCO2 and breath sounds checked- equal and bilateral Secured at: 21 cm Tube secured with: Tape Dental Injury: Teeth and Oropharynx as per pre-operative assessment

## 2015-09-04 NOTE — Transfer of Care (Signed)
Immediate Anesthesia Transfer of Care Note  Patient: John Hanson  Procedure(s) Performed: Procedure(s): MICRO LUMBAR DECOMPRESSION L4 - L5,  L3 - L4 2 LEVELS (N/A)  Patient Location: PACU  Anesthesia Type:General  Level of Consciousness: awake, alert  and oriented  Airway & Oxygen Therapy: Patient Spontanous Breathing and Patient connected to face mask oxygen  Post-op Assessment: Report given to RN and Post -op Vital signs reviewed and stable  Post vital signs: Reviewed and stable  Last Vitals:  Filed Vitals:   09/04/15 0625  BP: 179/96  Pulse: 89  Temp: 37.4 C  Resp: 16    Complications: No apparent anesthesia complications

## 2015-09-04 NOTE — H&P (View-Only) (Signed)
John Hanson is an 74 y.o. male.   Chief Complaint: back and leg pain HPI: The patient is a 74 year old male who presents with back pain. The patient is here today for a surgical consult and in referral from Dr. Nelva Bush. The patient reports low back symptoms which began year(s) ago following a specific injury. The injury occurred year(s) ago due to a fall. Symptoms are reported to be located in the low back and Symptoms include pain. The pain radiates to the left groin, left buttock, left posterior thigh, right groin, right buttock and right posterior thigh. The patient describes the severity of their symptoms as moderate to severe. The patient feels as if the symptoms are worsening. Symptoms are exacerbated by bending. Current treatment includes non-opioid analgesics and heating pad. Prior to being seen today the patient was previously evaluated by a primary physician, Dr. Louanne Skye and Dr. Nelva Bush. Past evaluation has included MRI of the lumbar spine. Past treatment has included non-opioid analgesics, opioid analgesics, corticosteroids, epidural injections and physical therapy.  He reports inability to ambulate. He gets pain and weakness into the legs bilaterally. He has had epidurals by Dr. Nelva Bush with temporary relief. He has reported significant difficulty and this has been worsening over the past three years.  REVIEW OF SYSTEMS Review of systems is negative for fevers, chest pain, shortness of breath, unexplained recent weight loss, loss of bowel or bladder function, burning with urination, joint swelling, rashes, weakness or numbness, difficulty with balance, easy bruising, excessive thirst or frequent urination.  He is taking Tramadol which does not help him. He has to lean forward and sit down for relief.  Past Medical History  Diagnosis Date  . Hypertension   . GERD (gastroesophageal reflux disease)   . Varicose veins     Past Surgical History  Procedure Laterality Date  . Endovenous  ablation saphenous vein w/ laser Right 05-31-2014    EVLA  right gretaer saphenous vein by Curt Jews MD    Family History  Problem Relation Age of Onset  . Kidney disease Mother    Social History:  reports that he has quit smoking. His smoking use included Cigars. He has never used smokeless tobacco. He reports that he does not drink alcohol or use illicit drugs.  Allergies: No Known Allergies   (Not in a hospital admission)  No results found for this or any previous visit (from the past 48 hour(s)). [place] Screening Aaa  08/19/2015  CLINICAL DATA:  Medicare screening exam for abdominal aortic aneurysm. EXAM: ABDOMINAL AORTA SCREENING ULTRASOUND TECHNIQUE: Ultrasound examination of the abdominal aorta was performed as a screening evaluation for abdominal aortic aneurysm. COMPARISON:  None. 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 Recommend followup by ultrasound in 5 years. This recommendation follows ACR consensus guidelines: White Paper of the ACR Incidental Findings Committee II on Vascular Findings. J Am Coll Radiol 2013; 10:789-794. Electronically Signed   By: Marcello Moores  Register   On: 08/19/2015 09:18    Review of Systems  Constitutional: Negative.   HENT: Negative.   Eyes: Negative.   Respiratory: Negative.   Cardiovascular: Negative.   Gastrointestinal: Negative.   Genitourinary: Negative.   Musculoskeletal: Positive for back pain.  Skin: Negative.   Neurological: Negative.   Psychiatric/Behavioral: Negative.     There were no vitals taken for this visit. Physical Exam  Constitutional: He  is oriented to person, place, and time. He appears well-developed.  HENT:  Head: Normocephalic.  Eyes: Pupils are equal, round, and reactive to light.  Neck: Normal range of motion.  Cardiovascular: Normal rate.   Respiratory:  Effort normal.  GI: Soft.  Musculoskeletal:  On exam, he is upright, in mild distress. Mood and affect is appropriate. He has pain with limited extension, relieved with forward flexion and straight leg raise produces buttock and thigh pain bilaterally. Has a quadriceps weakness bilaterally. He does have limitation in range of motion of his hips bilaterally, but no pain with range. No evidence of DVT. Pulses are intact and 1+ dorsalis pedis and posterior tibial pulses.   Neurological: He is alert and oriented to person, place, and time.  Skin: Skin is warm and dry.    MRI last year demonstrated severe stenosis, multifactorial at 3-4 and at 4-5, predominantly at 4-5. Facet hypertrophy, ligamentum flavum hypertrophy and congenitally short pedicles and epidural lipomatosis. He had fairly severe stenosis noted at 3-4 as well, moderate at T3 and at 5-1.  I reviewed his epidural steroid injections. They have helped in the past. Most recent one gave him only limited relief. He does have some back pain, but predominantly pain into the legs and buttock pain.  Three view x-rays, AP, lateral, flexion extension demonstrates multilevel disc degeneration, osteophytic spurring, nerve foraminal stenosis. No instability in flexion extension. He does have osteoarthrosis of his hips bilaterally, left greater than right.  Assessment/Plan 1. 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 3. 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 4. Gout. 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  Plan, recommendation and discussing: I had extensive discussion concerning his current pathology, relevant anatomy and treatment options. He does have claudication symptoms related to severe spinal stenosis. We did discuss options living with his symptoms as epidurals are no longer effective. Pharmacologic management which he indicates is not helping as well. Surgical option would be an option,  would be a decompression of 4-5, possibly at 4-5 and at 3-4.  I had an extensive discussion of the risks and benefits of the lumbar decompression with the patient including bleeding, infection, damage to neurovascular structures, epidural fibrosis, CSF leak requiring repair. We also discussed increase in pain, adjacent segment disease, recurrent disc herniation, need for future surgery including repeat decompression and/or fusion. We also discussed risks of postoperative hematoma, paralysis, anesthetic complications including DVT, PE, death, cardiopulmonary dysfunction. In addition, the perioperative and postoperative courses were discussed in detail including the rehabilitative time and return to functional activity and work. I provided the patient with an illustrated handout and utilized the appropriate surgical models.  He did indicate residual back pain related to his multilevel disc degenerations, osteoarthrosis of his hips. He does have gout and there is always a concern about precipitation of gout event following surgical intervention. He has no history of MRSA. He has not had surgery before. No heart attack or blood clot. We have discussed continued living with his symptoms, given prescription for Norco to be taken p.r.n. He would like to proceed with the procedure, specifically discussed thinning of the dura related to the severe compression over time. If a dural rent noted, that the application of a neural patch would be appropriate. One to two days in the hospital would be appropriate. Certainly, he has exhausted his conservative treatment to this point and again either living with his symptoms or consider decompression. Again, he would like to proceed with the  latter. We will commonly request preoperative clearance by Dr. Dorthy Cooler.  Plan microlumbar decompression L4-5, possible L3-4  Kawthar Ennen M. PA-C for Dr. Tonita Cong 08/20/2015, 5:04 PM

## 2015-09-04 NOTE — Brief Op Note (Signed)
09/04/2015  11:44 AM  PATIENT:  [name]  74 y.o. male  PRE-OPERATIVE DIAGNOSIS:  lumbar stenosis  POST-OPERATIVE DIAGNOSIS:  lumbar stenosis  PROCEDURE:  Procedure(s): MICRO LUMBAR DECOMPRESSION L4 - L5, POSSIBLE L3 - L4 1 LEVEL (N/A)  SURGEON:  Surgeon(s) and Role:    * Susa Day, MD - Primary  PHYSICIAN ASSISTANT:   ASSISTANTS: [place]   ANESTHESIA:   general  EBL:  Total I/O In: 1000 [I.V.:1000] Out: 600 [Urine:250; Blood:350]  BLOOD ADMINISTERED:none  DRAINS: none   LOCAL MEDICATIONS USED:  MARCAINE     SPECIMEN:  No Specimen  DISPOSITION OF SPECIMEN:  N/A  COUNTS:  YES  TOURNIQUET:  * No tourniquets in log *  DICTATION: .Other Dictation: Dictation Number  [id number]  PLAN OF CARE: Admit to inpatient   PATIENT DISPOSITION:  PACU - hemodynamically stable.   Delay start of Pharmacological VTE agent (>24hrs) due to surgical blood loss or risk of bleeding: yes

## 2015-09-05 DIAGNOSIS — I350 Nonrheumatic aortic (valve) stenosis: Secondary | ICD-10-CM | POA: Diagnosis not present

## 2015-09-05 DIAGNOSIS — G4730 Sleep apnea, unspecified: Secondary | ICD-10-CM | POA: Diagnosis not present

## 2015-09-05 DIAGNOSIS — M109 Gout, unspecified: Secondary | ICD-10-CM | POA: Diagnosis not present

## 2015-09-05 DIAGNOSIS — K219 Gastro-esophageal reflux disease without esophagitis: Secondary | ICD-10-CM | POA: Diagnosis not present

## 2015-09-05 DIAGNOSIS — M160 Bilateral primary osteoarthritis of hip: Secondary | ICD-10-CM | POA: Diagnosis not present

## 2015-09-05 DIAGNOSIS — M4806 Spinal stenosis, lumbar region: Secondary | ICD-10-CM | POA: Diagnosis not present

## 2015-09-05 DIAGNOSIS — M5136 Other intervertebral disc degeneration, lumbar region: Secondary | ICD-10-CM | POA: Diagnosis not present

## 2015-09-05 DIAGNOSIS — I10 Essential (primary) hypertension: Secondary | ICD-10-CM | POA: Diagnosis not present

## 2015-09-05 DIAGNOSIS — Z87891 Personal history of nicotine dependence: Secondary | ICD-10-CM | POA: Diagnosis not present

## 2015-09-05 LAB — BASIC METABOLIC PANEL
ANION GAP: 5 (ref 5–15)
BUN: 15 mg/dL (ref 6–20)
CHLORIDE: 105 mmol/L (ref 101–111)
CO2: 27 mmol/L (ref 22–32)
Calcium: 8.7 mg/dL — ABNORMAL LOW (ref 8.9–10.3)
Creatinine, Ser: 1.14 mg/dL (ref 0.61–1.24)
Glucose, Bld: 125 mg/dL — ABNORMAL HIGH (ref 65–99)
POTASSIUM: 3.8 mmol/L (ref 3.5–5.1)
SODIUM: 137 mmol/L (ref 135–145)

## 2015-09-05 LAB — CBC
HCT: 32.4 % — ABNORMAL LOW (ref 39.0–52.0)
HEMOGLOBIN: 10.8 g/dL — AB (ref 13.0–17.0)
MCH: 32.9 pg (ref 26.0–34.0)
MCHC: 33.3 g/dL (ref 30.0–36.0)
MCV: 98.8 fL (ref 78.0–100.0)
PLATELETS: 184 10*3/uL (ref 150–400)
RBC: 3.28 MIL/uL — AB (ref 4.22–5.81)
RDW: 13.4 % (ref 11.5–15.5)
WBC: 7.4 10*3/uL (ref 4.0–10.5)

## 2015-09-05 NOTE — Progress Notes (Signed)
Physical Therapy Treatment Patient Details Name: John Hanson MRN: [id number] DOB: 1941/09/13 Today's Date: 09/05/2015    History of Present Illness Microlumbar decompression, L3-4, L4-5 with Gill laminectomy of L4, foraminotomies L 4-5, bilaterally    PT Comments    Patient can benefit from HHPT, patient is not very steady and bed mobility is difficult. Caregivers have not been in for instruction.  Follow Up Recommendations  Home health PT;Supervision/Assistance - 24 hour     Equipment Recommendations       Recommendations for Other Services       Precautions / Restrictions Precautions Precautions: Back Precaution Comments: required cues to not bend forward, lifting restrictions    Mobility  Bed Mobility Overal bed mobility: Needs Assistance Bed Mobility: Rolling;Sidelying to Sit Rolling: Mod assist Sidelying to sit: Mod assist       General bed mobility comments: multimodal cue for rolling and for  sidelying to sit, assist for trunk  Transfers   Equipment used: Rolling walker (2 wheeled) Transfers: Sit to/from Stand Sit to Stand: Min assist         General transfer comment: cues for technique  Ambulation/Gait Ambulation/Gait assistance: Min assist Ambulation Distance (Feet): 20 Feet Assistive device: Rolling walker (2 wheeled) Gait Pattern/deviations: Step-to pattern;Trunk flexed   Gait velocity interpretation: Below normal speed for age/gender General Gait Details: cues for staying inside of the RW during turns.    Stairs            Wheelchair Mobility    Modified Rankin (Stroke Patients Only)       Balance                                    Cognition Arousal/Alertness: Awake/alert                          Exercises      General Comments        Pertinent Vitals/Pain Pain Assessment: Faces Faces Pain Scale: Hurts even more Pain Location: back Pain Descriptors / Indicators:  Sore;Guarding;Grimacing    Home Living                      Prior Function            PT Goals (current goals can now be found in the care plan section) Progress towards PT goals: Progressing toward goals    Frequency  Min 5X/week    PT Plan Current plan remains appropriate;Discharge plan needs to be updated    Co-evaluation             End of Session   Activity Tolerance: Patient tolerated treatment well Patient left: in chair;with call bell/phone within reach;with chair alarm set     Time: [id number] PT Time Calculation (min) (ACUTE ONLY): 15 min  Charges:  $Gait Training: 8-22 mins                    G Codes:      Claretha Cooper 09/05/2015, 4:30 PM Tresa Endo PT [phone]

## 2015-09-05 NOTE — Care Management Note (Signed)
Case Management Note  Patient Details  Name: GERARD SUDDITH MRN: [id number] Date of Birth: 07/07/1941  Subjective/Objective:       S/p Microlumbar decompression, L3-4, L4-5 with Gill laminectomy of L4.             Action/Plan: Discharge planning, no HH needs identified  Expected Discharge Date:                  Expected Discharge Plan:  Home/Self Care  In-House Referral:  NA  Discharge planning Services  CM Consult  Post Acute Care Choice:  NA Choice offered to:  NA  DME Arranged:  N/A DME Agency:  NA  HH Arranged:  NA HH Agency:  NA  Status of Service:  Completed, signed off  Medicare Important Message Given:    Date Medicare IM Given:    Medicare IM give by:    Date Additional Medicare IM Given:    Additional Medicare Important Message give by:     If discussed at [place] of Stay Meetings, dates discussed:    Additional Comments:  Guadalupe Maple, RN 09/05/2015, 12:16 PM

## 2015-09-05 NOTE — Progress Notes (Signed)
Subjective: 1 Day Post-Op Procedure(s) (LRB): MICRO LUMBAR DECOMPRESSION L4 - L5,  L3 - L4 2 LEVELS (N/A) Patient reports pain as moderate.   Reports incisional back pain. No c/o leg pain. Not ready for D/C home today.  Objective: Vital signs in last 24 hours: Temp:  [98.3 F (36.8 C)-99.1 F (37.3 C)] 98.3 F (36.8 C) (12/08 1000) Pulse Rate:  [72-81] 78 (12/08 0453) Resp:  [16-20] 18 (12/08 1000) BP: (129-181)/(71-101) 147/86 mmHg (12/08 1000) SpO2:  [96 %-100 %] 100 % (12/08 1000)  Intake/Output from previous day: 12/07 0701 - 12/08 0700 In: 4290 [P.O.:1440; I.V.:2750; IV Piggyback:100] Out: 2250 [Urine:1900; Blood:350] Intake/Output this shift: Total I/O In: 340 [P.O.:340] Out: 200 [Urine:200]   Recent Labs  09/05/15 0410  HGB 10.8*    Recent Labs  09/05/15 0410  WBC 7.4  RBC 3.28*  HCT 32.4*  PLT 184    Recent Labs  09/05/15 0410  NA 137  K 3.8  CL 105  CO2 27  BUN 15  CREATININE 1.14  GLUCOSE 125*  CALCIUM 8.7*   No results for input(s): LABPT, INR in the last 72 hours.  Neurologically intact ABD soft Neurovascular intact Sensation intact distally Intact pulses distally Dorsiflexion/Plantar flexion intact Incision: dressing C/D/I and no drainage No cellulitis present Compartment soft no sign of DVT  Assessment/Plan: 1 Day Post-Op Procedure(s) (LRB): MICRO LUMBAR DECOMPRESSION L4 - L5,  L3 - L4 2 LEVELS (N/A) Advance diet Up with therapy D/C IV fluids  Plan D/C tomorrow  Lacie Draft M. 09/05/2015, 12:44 PM

## 2015-09-05 NOTE — Progress Notes (Signed)
Subjective: 1 Day Post-Op Procedure(s) (LRB): MICRO LUMBAR DECOMPRESSION L4 - L5,  L3 - L4 2 LEVELS (N/A) Patient reports pain as 4 on 0-10 scale.    Objective: Vital signs in last 24 hours: Temp:  [98.3 F (36.8 C)-99.7 F (37.6 C)] 98.5 F (36.9 C) (12/08 0453) Pulse Rate:  [72-86] 78 (12/08 0453) Resp:  [8-21] 16 (12/08 0453) BP: (129-181)/(71-101) 147/86 mmHg (12/08 0453) SpO2:  [96 %-100 %] 98 % (12/08 0453)  Intake/Output from previous day: 12/07 0701 - 12/08 0700 In: 4290 [P.O.:1440; I.V.:2750; IV Piggyback:100] Out: 2250 [Urine:1900; Blood:350] Intake/Output this shift: Total I/O In: 340 [P.O.:340] Out: -    Recent Labs  09/05/15 0410  HGB 10.8*    Recent Labs  09/05/15 0410  WBC 7.4  RBC 3.28*  HCT 32.4*  PLT 184    Recent Labs  09/05/15 0410  NA 137  K 3.8  CL 105  CO2 27  BUN 15  CREATININE 1.14  GLUCOSE 125*  CALCIUM 8.7*   No results for input(s): LABPT, INR in the last 72 hours.  Neurologically intact Sensation intact distally Dorsiflexion/Plantar flexion intact Compartment soft  Assessment/Plan: 1 Day Post-Op Procedure(s) (LRB): MICRO LUMBAR DECOMPRESSION L4 - L5,  L3 - L4 2 LEVELS (N/A) Advance diet Up with therapy Plan for discharge tomorrow  Velva Molinari C 09/05/2015, 10:19 AM

## 2015-09-05 NOTE — Evaluation (Signed)
Occupational Therapy Evaluation Patient Details Name: John Hanson MRN: [id number] DOB: [birth year] Today's Date: 09/05/2015    History of Present Illness Microlumbar decompression, L3-4, L4-5 with Gill laminectomy of L4, foraminotomies L 4-5, bilaterally   Clinical Impression   Pt admitted for back surgery. Pt currently with functional limitations due to the deficits listed below (see OT Problem List).  Pt will benefit from skilled OT to increase their safety and independence with ADL and functional mobility for ADL to facilitate discharge to venue listed below.     Follow Up Recommendations  Home health OT;Supervision/Assistance - 24 hour    Equipment Recommendations  Tub/shower seat    Recommendations for Other Services       Precautions / Restrictions Precautions Precautions: Back Precaution Booklet Issued: Yes (comment) Precaution Comments: required cues to not bend forward, lifting restrictions      Mobility Bed Mobility Overal bed mobility: Needs Assistance Bed Mobility: Rolling;Sidelying to Sit Rolling: Min assist Sidelying to sit: Min assist;HOB elevated       General bed mobility comments: pt in chair  Transfers Overall transfer level: Needs assistance Equipment used: Rolling walker (2 wheeled) Transfers: Sit to/from Stand Sit to Stand: Min assist         General transfer comment: cues for technique    Balance Overall balance assessment: Needs assistance         Standing balance support: During functional activity;No upper extremity supported Standing balance-Leahy Scale: Fair                              ADL   Eating/Feeding: Set up;Sitting                   Lower Body Dressing: Maximal assistance;Sit to/from stand   Toilet Transfer: Moderate assistance;Comfort height toilet;RW   Toileting- Clothing Manipulation and Hygiene: Moderate assistance;Sit to/from stand                         Pertinent  Vitals/Pain Pain Assessment: 0-10 Pain Score: 5  Pain Location: back Pain Descriptors / Indicators: Sore Pain Intervention(s): Limited activity within patient's tolerance;Monitored during session;Repositioned;Patient requesting pain meds-RN notified     Hand Dominance     Extremity/Trunk Assessment Upper Extremity Assessment Upper Extremity Assessment: Generalized weakness   Lower Extremity Assessment Lower Extremity Assessment: Overall WFL for tasks assessed   Cervical / Trunk Assessment Cervical / Trunk Assessment: Other exceptions Cervical / Trunk Exceptions: trunk forward flexed   Communication Communication Communication: No difficulties   Cognition Arousal/Alertness: Awake/alert Behavior During Therapy: WFL for tasks assessed/performed Overall Cognitive Status: Within Functional Limits for tasks assessed                                Home Living Family/patient expects to be discharged to:: Private residence Living Arrangements: Alone;Children Available Help at Discharge: Family;Available 24 hours/day Type of Home: House Home Access: Level entry     Home Layout: One level     Bathroom Shower/Tub: Teacher, early years/pre: Standard     Home Equipment: Environmental consultant - 2 wheels;Cane - single point;Grab bars - tub/shower   Additional Comments: states that he can borrow a RW      Prior Functioning/Environment Level of Independence: Independent with assistive device(s)             OT Diagnosis:  Generalized weakness;Acute pain   OT Problem List: Decreased strength;Pain   OT Treatment/Interventions: Self-care/ADL training;DME and/or AE instruction;Patient/family education    OT Goals(Current goals can be found in the care plan section) Acute Rehab OT Goals Patient Stated Goal: to walk without pain OT Goal Formulation: With patient Time For Goal Achievement: 09/19/15  OT Frequency: Min 2X/week   Barriers to D/C:               End  of Session    Activity Tolerance: Patient tolerated treatment well Patient left: in chair;with call bell/phone within reach;with chair alarm set   Time: 0940-1010 OT Time Calculation (min): 30 min Charges:  OT General Charges $OT Visit: 1 Procedure OT Evaluation $Initial OT Evaluation Tier I: 1 Procedure OT Treatments $Self Care/Home Management : 8-22 mins G-Codes: OT G-codes **NOT FOR INPATIENT CLASS** Functional Assessment Tool Used: clinical observation Functional Limitation: Self care Self Care Current Status [id number]): At least 40 percent but less than 60 percent impaired, limited or restricted Self Care Goal Status [id number]): At least 1 percent but less than 20 percent impaired, limited or restricted  [place], Thereasa Parkin 09/05/2015, 10:23 AM

## 2015-09-05 NOTE — Evaluation (Signed)
Physical Therapy Evaluation Patient Details Name: John Hanson MRN: [id number] DOB: 1941-05-11 Today's Date: 09/05/2015   History of Present Illness  Microlumbar decompression, L3-4, L4-5 with Gill laminectomy of L4, foraminotomies L 4-5, bilaterally  Clinical Impression  Patient  Ambulating with RW, reports that he can borrow a RW. Will address again next visit this PM. Patient will benefit from PT to address problems listed in the note below to DC to home. Patient also states that his son is availalble to stay.    Follow Up Recommendations No PT follow up;Supervision - Intermittent    Equipment Recommendations  None recommended by PT    Recommendations for Other Services       Precautions / Restrictions Precautions Precautions: Back Precaution Booklet Issued: Yes (comment) Precaution Comments: required cues to not bend forward, lifting restrictions      Mobility  Bed Mobility Overal bed mobility: Needs Assistance Bed Mobility: Rolling;Sidelying to Sit Rolling: Min assist Sidelying to sit: Min assist;HOB elevated       General bed mobility comments: HOB at 45 degrees, patient did use rail, will need to practice with flat bed next visit,  Transfers Overall transfer level: Needs assistance Equipment used: Rolling walker (2 wheeled) Transfers: Sit to/from Stand Sit to Stand: Supervision         General transfer comment: cues for technique  Ambulation/Gait Ambulation/Gait assistance: Min guard Ambulation Distance (Feet): 200 Feet Assistive device: Rolling walker (2 wheeled) Gait Pattern/deviations: Step-through pattern;Trunk flexed   Gait velocity interpretation: at or above normal speed for age/gender General Gait Details: then ambulated in room, to bathroom with no RW, did hold onto bed, door, rail in  bathroom to urinate.  Stairs            Wheelchair Mobility    Modified Rankin (Stroke Patients Only)       Balance Overall balance  assessment: Needs assistance         Standing balance support: During functional activity;No upper extremity supported Standing balance-Leahy Scale: Fair                               Pertinent Vitals/Pain Pain Assessment: 0-10 Pain Score: 5  Pain Location: back Pain Descriptors / Indicators: Aching;Grimacing;Guarding Pain Intervention(s): Limited activity within patient's tolerance;Premedicated before session;Repositioned    Home Living Family/patient expects to be discharged to:: Private residence Living Arrangements: Alone;Children Available Help at Discharge: Family;Available 24 hours/day Type of Home: House Home Access: Level entry     Home Layout: One level Home Equipment: Walker - 2 wheels;Cane - single point;Grab bars - tub/shower Additional Comments: states that he can borrow a RW    Prior Function Level of Independence: Independent with assistive device(s)               Hand Dominance        Extremity/Trunk Assessment   Upper Extremity Assessment: Defer to OT evaluation           Lower Extremity Assessment: Overall WFL for tasks assessed      Cervical / Trunk Assessment: Other exceptions  Communication   Communication: No difficulties  Cognition Arousal/Alertness: Awake/alert Behavior During Therapy: WFL for tasks assessed/performed Overall Cognitive Status: Within Functional Limits for tasks assessed                      General Comments      Exercises  Assessment/Plan    PT Assessment Patient needs continued PT services  PT Diagnosis Difficulty walking;Acute pain   PT Problem List Decreased strength;Decreased range of motion;Decreased activity tolerance;Decreased balance;Decreased mobility;Decreased knowledge of precautions;Decreased knowledge of use of DME;Decreased safety awareness;Pain  PT Treatment Interventions DME instruction;Gait training;Functional mobility training;Therapeutic  activities;Therapeutic exercise;Patient/family education   PT Goals (Current goals can be found in the Care Plan section) Acute Rehab PT Goals Patient Stated Goal: to walk without pain PT Goal Formulation: With patient Time For Goal Achievement: 09/07/15 Potential to Achieve Goals: Good    Frequency Min 5X/week   Barriers to discharge        Co-evaluation               End of Session Equipment Utilized During Treatment: Gait belt Activity Tolerance: Patient tolerated treatment well Patient left: in chair;with call bell/phone within reach;with chair alarm set Nurse Communication: Mobility status    Functional Assessment Tool Used: clinical judgement Functional Limitation: Mobility: Walking and moving around Mobility: Walking and Moving Around Current Status [phone]): At least 20 percent but less than 40 percent impaired, limited or restricted Mobility: Walking and Moving Around Goal Status [phone]): At least 1 percent but less than 20 percent impaired, limited or restricted    Time: 0822-0841 PT Time Calculation (min) (ACUTE ONLY): 19 min   Charges:   PT Evaluation $Initial PT Evaluation Tier I: 1 Procedure     PT G Codes:   PT G-Codes **NOT FOR INPATIENT CLASS** Functional Assessment Tool Used: clinical judgement Functional Limitation: Mobility: Walking and moving around Mobility: Walking and Moving Around Current Status [id number]): At least 20 percent but less than 40 percent impaired, limited or restricted Mobility: Walking and Moving Around Goal Status [phone]): At least 1 percent but less than 20 percent impaired, limited or restricted    Claretha Cooper 09/05/2015, 8:52 AM Tresa Endo PT [phone]

## 2015-09-05 NOTE — Op Note (Signed)
NAMENOHEA, MATHIESEN NO.:  [id number]  MEDICAL RECORD NO.:  [id number]  LOCATION:  [id number]                         FACILITY:  [facility]  PHYSICIAN:  Susa Day, M.D.    DATE OF BIRTH:  September 07, 1941  DATE OF PROCEDURE:  09/04/2015 DATE OF DISCHARGE:                              OPERATIVE REPORT   PREOPERATIVE DIAGNOSIS:  Spinal stenosis, 3-4, 4-5.  POSTOPERATIVE DIAGNOSIS:  Spinal stenosis, 3-4, 4-5.  PROCEDURE PERFORMED: 1. Microlumbar decompression, L3-4, L4-5 with Gill laminectomy of L4. 2. Foraminotomies L4-L5 bilaterally.  ANESTHESIA:  General.  ASSISTANT:  Cleophas Dunker, PA  HISTORY:  A 74 year old, neurogenic claudication indicating spinal stenosis.  He had degenerative scoliosis.  There was known instability on flexion, extension started also at 3-4.  He had neurogenic claudication secondary to spinal stenosis, posterior to anterior thigh. He had been refractory to conservative treatment, had multiple epidural steroid injection with temporary relief due to severe effect on his activities of daily living, quadriceps weakness and pain, he was indicated for decompression.  His MRI indicating severe spinal stenosis 4-5, 3-4 particularly on the left lateral recess to the concavity of the scoliosis.  He was planned to perform a central decompression removing the lamina 4, central decompression at 4-5, removed the lamina 4, decompressing 3-4.  Risks and benefits were discussed including bleeding, infection, damage to neurovascular structures, DVT, PE, anesthetic complications, no change in symptoms, worsening symptoms, DVT, PE, anesthetic complications, etc.  TECHNIQUE:  With the patient in supine position, after induction of adequate general anesthesia, 2 g Kefzol, placed prone on the Pabellones frame.  All bony prominences well padded.  Lumbar region was prepped and draped in usual sterile fashion.  Foley to gravity.  Two 18-gauge spinal needle was  utilized to localize the 3-4 and 4-5 interspace.  Incision was made from spinous process, 3 down to below 5.  Subcutaneous tissue was dissected.  Electrocautery was utilized to achieve hemostasis. 0.25% Marcaine with epinephrine was infiltrated in dorsolumbar fascia. The dorsolumbar fascia was identified, divided in line with skin incision from lamina 3 down to below 5.  Paraspinous muscles elevated from lamina of 3-4 and 4-5.  McCullough retractor was placed.  Operating microscope was draped and brought on the surgical field after confirmatory radiograph obtained.  Leksell was utilized to remove the spinous processes of 4, partial 5, and 3.  Fairly hard bone.  Bone wax, electrocautery, and thrombin-soaked Gelfoam was used to achieve hemostasis.  He had elevated blood pressure which we treated to his bleeding.  That was subsequently reduced to systolic 0000000.  With operating microscope, we utilized a curette to detach ligamentum flavum from the caudad edge of L4.  Neuro patty was placed beneath the ligamentum flavum.  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  This was central and lateral recess.  We removed ligamentum flavum from the interspace at 4-5 protecting the neural elements, decompressed the lateral recess to the medial border of the pedicle. Detached the ligamentum flavum from the cephalad edge of 5.  Performed foraminotomies of L5.  There was no disk herniation.  Neural probe passed freely at the foramen L5.  Continued cephalad and we removed the lamina of 4 with starting  caudad and performed hemilaminotomies bilaterally.  The disk space was entered at 3.  The interlaminar space was entered at 3-4 as well, this was an area of scoliosis occurring to the right.  There was severe compression of the thecal sac laterally under the lamina of neural arch of 4 on the left.  With the use of operating microscope and neuro patties and a combination of a micro and straight  curette, we mobilized the thecal sac from the lamina.  This lamina was then removed with a 2-mm Kerrison.  There was synovial tissue from the facet at 4-5 as well.  Meticulously, developed a plane on the thecal sac and continued cephalad.  At 3-4, there was hypertrophic ligamentum flavum, as well and it was severe central and to the left. Again, we protected the thecal sac and we removed ligamentum flavum from the interspace 3-4.  The neural arch of 4 was removed.  There was a portion the ligamentum at 3-4 that after detaching from the caudad edge of 3 remained hinged in the thecal sac.  I felt removing this would not further decompress and would prevent inadvertent durotomy.  Then foraminotomies of L4 were performed as well.  Following this, decompression of the neural probe passed freely at the foramen of 4 and 5 bilaterally and cephalad to pedicle of 3.  Again, there was no instability at 3-4 that was noted.  He had ossification of his disc and he has facets.  The bipolar electrocautery was utilized to achieve hemostasis, copiously irrigated with antibiotic irrigation.  Performed a Valsalva.  No evidence of CSF leakage.  Again, achieved meticulous hemostasis with bone wax, thrombin-soaked Gelfoam.  The restoration of the thecal sac noted particularly at 4-5.  There was significant adhesions particularly on the left beneath the facet at 4-5 and cephalad to that.  There was a large hypertrophic ligamentum flavum that was noted at 3-4 as well.  Again, at the left 3-4, there was some ligamentum that remained due to adhesions to the thecal sac.  Therefore, the residual remained.  We left that adhesions again to avoid inadvertent durotomy.  The hemilaminotomies at the caudad edge of 3 were performed as well.  Neural probe passed freely into the floor of the pedicle of 5. Following this, again hypertrophic ligamentum flavum, was fairly adhesed, but it felt that the decompression at 3-4  satisfactory with some of the residual ligamentum flavum remaining.  Therefore, I removed McCullough retractor.  No active bleeding.  Copiously irrigated the wound.  Placed thrombin-soaked Gelfoam in the laminotomy defects.  I removed McCullough retractor, copiously irrigated wound, repaired the dorsolumbar fascia with #1 Vicryl in a figure-of-eight sutures, subcu with 0 and 2-0 Vicryl simple sutures, skin with staples and wound dressed sterilely, placed supine on the hospital bed, extubated without difficulty, and transported to the recovery room in satisfactory condition.  The patient tolerated the procedure well.  No complications. Blood loss 325 mL.  Cleophas Dunker, PA, was the assistant.  Again technical difficulty increased due to the patient's significant adhesions of the ligamentum flavum to the thecal sac as well as the patient's degenerative scoliosis and previous multiple epidural steroid injections.     Susa Day, M.D.     Geralynn Rile  D:  09/04/2015  T:  09/05/2015  Job:  [id number]

## 2015-09-06 DIAGNOSIS — M109 Gout, unspecified: Secondary | ICD-10-CM | POA: Diagnosis not present

## 2015-09-06 DIAGNOSIS — M160 Bilateral primary osteoarthritis of hip: Secondary | ICD-10-CM | POA: Diagnosis not present

## 2015-09-06 DIAGNOSIS — I10 Essential (primary) hypertension: Secondary | ICD-10-CM | POA: Diagnosis not present

## 2015-09-06 DIAGNOSIS — I350 Nonrheumatic aortic (valve) stenosis: Secondary | ICD-10-CM | POA: Diagnosis not present

## 2015-09-06 DIAGNOSIS — G4730 Sleep apnea, unspecified: Secondary | ICD-10-CM | POA: Diagnosis not present

## 2015-09-06 DIAGNOSIS — M4806 Spinal stenosis, lumbar region: Secondary | ICD-10-CM | POA: Diagnosis not present

## 2015-09-06 DIAGNOSIS — M5136 Other intervertebral disc degeneration, lumbar region: Secondary | ICD-10-CM | POA: Diagnosis not present

## 2015-09-06 DIAGNOSIS — Z87891 Personal history of nicotine dependence: Secondary | ICD-10-CM | POA: Diagnosis not present

## 2015-09-06 DIAGNOSIS — K219 Gastro-esophageal reflux disease without esophagitis: Secondary | ICD-10-CM | POA: Diagnosis not present

## 2015-09-06 NOTE — Progress Notes (Signed)
Physical Therapy Treatment Patient Details Name: John Hanson MRN: [id number] DOB: 1940-10-18 Today's Date: 09/06/2015    History of Present Illness Microlumbar decompression, L3-4, L4-5 with Gill laminectomy of L4, foraminotomies L 4-5, bilaterally    PT Comments    Pt OOB in recliner.  Reviewed back precautions and gave handout.  Assisted with amb in hallway and practiced one step forward with walker.  Instructed on use of ICE and lifting restriction.    Follow Up Recommendations  Home health PT;Supervision/Assistance - 24 hour     Equipment Recommendations  None recommended by PT    Recommendations for Other Services       Precautions / Restrictions Precautions Precautions: Back Precaution Booklet Issued: Yes (comment) Precaution Comments: reviewed back precautions and gave handout Restrictions Weight Bearing Restrictions: No    Mobility  Bed Mobility Overal bed mobility: Needs Assistance             General bed mobility comments: Pt OOB in recliner  Transfers Overall transfer level: Needs assistance Equipment used: Rolling walker (2 wheeled) Transfers: Sit to/from Stand Sit to Stand: Supervision;Min guard Stand pivot transfers: Min guard       General transfer comment: 25% VC's on proper tech and safety with turns and controlled decend  Ambulation/Gait Ambulation/Gait assistance: Supervision;Min guard Ambulation Distance (Feet): 65 Feet Assistive device: Rolling walker (2 wheeled) Gait Pattern/deviations: Step-through pattern;Decreased stride length Gait velocity: decreased   General Gait Details: <25% VC's on proper walker to self distance and safety with turns.    Stairs  one step forward with walker with 25% VC's on proper tech          Wheelchair Mobility    Modified Rankin (Stroke Patients Only)       Balance                                    Cognition Arousal/Alertness: Awake/alert Behavior During  Therapy: WFL for tasks assessed/performed Overall Cognitive Status: Within Functional Limits for tasks assessed                      Exercises      General Comments        Pertinent Vitals/Pain Pain Assessment: 0-10 Pain Score: 5  Pain Location: back Pain Descriptors / Indicators: Constant;Discomfort;Grimacing;Sore Pain Intervention(s): Monitored during session;Premedicated before session;Repositioned;Ice applied    Home Living                      Prior Function            PT Goals (current goals can now be found in the care plan section) Progress towards PT goals: Progressing toward goals    Frequency  Min 5X/week    PT Plan Current plan remains appropriate    Co-evaluation             End of Session Equipment Utilized During Treatment: Gait belt Activity Tolerance: Patient tolerated treatment well Patient left: in chair;with call bell/phone within reach;with chair alarm set     Time: 1000-1025 PT Time Calculation (min) (ACUTE ONLY): 25 min  Charges:  $Gait Training: 8-22 mins $Therapeutic Activity: 8-22 mins                    G Codes:      Rica Koyanagi  PTA [facility]  Acute  Rehab Pager  [phone]  

## 2015-09-06 NOTE — Care Management Note (Signed)
Case Management Note  Patient Details  Name: John Hanson MRN: [id number] Date of Birth: 05/28/1941  Subjective/Objective:  S/p Microlumbar decompression, L3-4, L4-5 with Gill laminectomy of L4                  Action/Plan: Discharge planning, spoke with patient at bedside about recommendations for HHPT. Patient is agreeable, has no preference for [facility] agency. Contacted [facility] for referral. No further needs assessed, ready for d/c home.   Expected Discharge Date:                  Expected Discharge Plan:  [place]  In-House Referral:  NA  Discharge planning Services  CM Consult  Post Acute Care Choice:  Home Health Choice offered to:  Patient  DME Arranged:  N/A DME Agency:  NA  HH Arranged:  PT [place] Agency:  [place]  Status of Service:  Completed, signed off  Medicare Important Message Given:    Date Medicare IM Given:    Medicare IM give by:    Date Additional Medicare IM Given:    Additional Medicare Important Message give by:     If discussed at [place] of Stay Meetings, dates discussed:    Additional Comments:  Guadalupe Maple, RN 09/06/2015, 11:46 AM

## 2015-09-06 NOTE — Progress Notes (Signed)
Subjective: 2 Days Post-Op Procedure(s) (LRB): MICRO LUMBAR DECOMPRESSION L4 - L5,  L3 - L4 2 LEVELS (N/A) Patient reports pain as 3 on 0-10 scale.    Objective: Vital signs in last 24 hours: Temp:  [98.3 F (36.8 C)-100.8 F (38.2 C)] 99 F (37.2 C) (12/09 0443) Pulse Rate:  [77-89] 82 (12/09 0443) Resp:  [16-18] 16 (12/09 0443) BP: (132-166)/(69-86) 132/69 mmHg (12/09 0443) SpO2:  [97 %-100 %] 98 % (12/09 0443)  Intake/Output from previous day: 12/08 0701 - 12/09 0700 In: 580 [P.O.:580] Out: 1200 [Urine:1200] Intake/Output this shift:     Recent Labs  09/05/15 0410  HGB 10.8*    Recent Labs  09/05/15 0410  WBC 7.4  RBC 3.28*  HCT 32.4*  PLT 184    Recent Labs  09/05/15 0410  NA 137  K 3.8  CL 105  CO2 27  BUN 15  CREATININE 1.14  GLUCOSE 125*  CALCIUM 8.7*   No results for input(s): LABPT, INR in the last 72 hours.  Neurologically intact Sensation intact distally Intact pulses distally Incision: dressing C/D/I No cellulitis present  Assessment/Plan: 2 Days Post-Op Procedure(s) (LRB): MICRO LUMBAR DECOMPRESSION L4 - L5,  L3 - L4 2 LEVELS (N/A) Advance diet Discharge home with home health  John Hanson C 09/06/2015, 7:09 AM

## 2015-09-06 NOTE — Progress Notes (Signed)
Occupational Therapy Treatment Patient Details Name: John Hanson MRN: [id number] DOB: 05-04-1941 Today's Date: 09/06/2015    History of present illness Microlumbar decompression, L3-4, L4-5 with Gill laminectomy of L4, foraminotomies L 4-5, bilaterally   OT comments  Pt states he will now be staying with girlfriend and she will A with ADL activity as needed  Follow Up Recommendations  Home health OT;Supervision/Assistance - 24 hour    Equipment Recommendations  Tub/shower seat    Recommendations for Other Services      Precautions / Restrictions Precautions Precautions: Back Precaution Comments: required cues to not bend forward, lifting restrictions       Mobility Bed Mobility Overal bed mobility: Needs Assistance             General bed mobility comments: pt in chair  Transfers Overall transfer level: Needs assistance Equipment used: Rolling walker (2 wheeled) Transfers: Sit to/from [facility] Sit to Stand: Min guard Stand pivot transfers: Min guard       General transfer comment: VC for safety and technique        ADL   Eating/Feeding: Set up;Sitting   Grooming: Supervision/safety;Standing   Upper Body Bathing: Set up;Sitting   Lower Body Bathing: Moderate assistance;Sit to/from stand;Cueing for sequencing;Cueing for back precautions   Upper Body Dressing : Set up;Sitting   Lower Body Dressing: Moderate assistance;Sit to/from stand;Cueing for sequencing;Cueing for back precautions Lower Body Dressing Details (indicate cue type and reason): pt states friend will A him.   Toilet Transfer: Minimal assistance;Comfort height toilet;Ambulation;RW   Toileting- Clothing Manipulation and Hygiene: Minimal assistance;Cueing for sequencing;Cueing for safety;Cueing for back precautions                          Cognition   Behavior During Therapy: WFL for tasks assessed/performed Overall Cognitive Status: Within Functional  Limits for tasks assessed                       Extremity/Trunk Assessment                     General Comments      Pertinent Vitals/ Pain       Pain Score: 3  Pain Location: back Pain Descriptors / Indicators: Sore Pain Intervention(s): Limited activity within patient's tolerance;Monitored during session         Frequency Min 2X/week     Progress Toward Goals  OT Goals(current goals can now be found in the care plan section)  Progress towards OT goals: Progressing toward goals     Plan Discharge plan remains appropriate    Co-evaluation                 End of Session     Activity Tolerance Patient tolerated treatment well   Patient Left in chair;with call bell/phone within reach;with chair alarm set   Nurse Communication      Functional Assessment Tool Used: clinical observation Functional Limitation: Self care Self Care Current Status [id number]): At least 40 percent but less than 60 percent impaired, limited or restricted Self Care Goal Status [id number]): At least 1 percent but less than 20 percent impaired, limited or restricted   Time: 1022-1036 OT Time Calculation (min): 14 min  Charges: OT G-codes **NOT FOR INPATIENT CLASS** Functional Assessment Tool Used: clinical observation Functional Limitation: Self care Self Care Current Status [id number]): At least 40 percent but less than 60 percent impaired, limited  or restricted Self Care Goal Status [phone]): At least 1 percent but less than 20 percent impaired, limited or restricted OT General Charges $OT Visit: 1 Procedure OT Treatments $Self Care/Home Management : 8-22 mins  Shiri Hodapp, Thereasa Parkin 09/06/2015, 11:49 AM

## 2015-09-06 NOTE — Progress Notes (Signed)
Dr Tonita Cong notified of need for face to face per case management. Bethann Punches RN

## 2015-09-07 DIAGNOSIS — K219 Gastro-esophageal reflux disease without esophagitis: Secondary | ICD-10-CM | POA: Diagnosis not present

## 2015-09-07 DIAGNOSIS — Z4789 Encounter for other orthopedic aftercare: Secondary | ICD-10-CM | POA: Diagnosis not present

## 2015-09-07 DIAGNOSIS — I10 Essential (primary) hypertension: Secondary | ICD-10-CM | POA: Diagnosis not present

## 2015-09-07 DIAGNOSIS — R531 Weakness: Secondary | ICD-10-CM | POA: Diagnosis not present

## 2015-09-07 DIAGNOSIS — I8390 Asymptomatic varicose veins of unspecified lower extremity: Secondary | ICD-10-CM | POA: Diagnosis not present

## 2015-09-07 DIAGNOSIS — Z87891 Personal history of nicotine dependence: Secondary | ICD-10-CM | POA: Diagnosis not present

## 2015-09-07 DIAGNOSIS — M109 Gout, unspecified: Secondary | ICD-10-CM | POA: Diagnosis not present

## 2015-09-07 DIAGNOSIS — M160 Bilateral primary osteoarthritis of hip: Secondary | ICD-10-CM | POA: Diagnosis not present

## 2015-09-07 DIAGNOSIS — R262 Difficulty in walking, not elsewhere classified: Secondary | ICD-10-CM | POA: Diagnosis not present

## 2015-09-10 DIAGNOSIS — I10 Essential (primary) hypertension: Secondary | ICD-10-CM | POA: Diagnosis not present

## 2015-09-10 DIAGNOSIS — Z87891 Personal history of nicotine dependence: Secondary | ICD-10-CM | POA: Diagnosis not present

## 2015-09-10 DIAGNOSIS — R531 Weakness: Secondary | ICD-10-CM | POA: Diagnosis not present

## 2015-09-10 DIAGNOSIS — R262 Difficulty in walking, not elsewhere classified: Secondary | ICD-10-CM | POA: Diagnosis not present

## 2015-09-10 DIAGNOSIS — Z4789 Encounter for other orthopedic aftercare: Secondary | ICD-10-CM | POA: Diagnosis not present

## 2015-09-10 DIAGNOSIS — I8390 Asymptomatic varicose veins of unspecified lower extremity: Secondary | ICD-10-CM | POA: Diagnosis not present

## 2015-09-10 DIAGNOSIS — K219 Gastro-esophageal reflux disease without esophagitis: Secondary | ICD-10-CM | POA: Diagnosis not present

## 2015-09-10 DIAGNOSIS — M109 Gout, unspecified: Secondary | ICD-10-CM | POA: Diagnosis not present

## 2015-09-10 DIAGNOSIS — M160 Bilateral primary osteoarthritis of hip: Secondary | ICD-10-CM | POA: Diagnosis not present

## 2015-09-13 DIAGNOSIS — M160 Bilateral primary osteoarthritis of hip: Secondary | ICD-10-CM | POA: Diagnosis not present

## 2015-09-13 DIAGNOSIS — I8390 Asymptomatic varicose veins of unspecified lower extremity: Secondary | ICD-10-CM | POA: Diagnosis not present

## 2015-09-13 DIAGNOSIS — K219 Gastro-esophageal reflux disease without esophagitis: Secondary | ICD-10-CM | POA: Diagnosis not present

## 2015-09-13 DIAGNOSIS — R531 Weakness: Secondary | ICD-10-CM | POA: Diagnosis not present

## 2015-09-13 DIAGNOSIS — I10 Essential (primary) hypertension: Secondary | ICD-10-CM | POA: Diagnosis not present

## 2015-09-13 DIAGNOSIS — R262 Difficulty in walking, not elsewhere classified: Secondary | ICD-10-CM | POA: Diagnosis not present

## 2015-09-13 DIAGNOSIS — M109 Gout, unspecified: Secondary | ICD-10-CM | POA: Diagnosis not present

## 2015-09-13 DIAGNOSIS — Z87891 Personal history of nicotine dependence: Secondary | ICD-10-CM | POA: Diagnosis not present

## 2015-09-13 DIAGNOSIS — Z4789 Encounter for other orthopedic aftercare: Secondary | ICD-10-CM | POA: Diagnosis not present

## 2015-09-16 DIAGNOSIS — R262 Difficulty in walking, not elsewhere classified: Secondary | ICD-10-CM | POA: Diagnosis not present

## 2015-09-16 DIAGNOSIS — M160 Bilateral primary osteoarthritis of hip: Secondary | ICD-10-CM | POA: Diagnosis not present

## 2015-09-16 DIAGNOSIS — I10 Essential (primary) hypertension: Secondary | ICD-10-CM | POA: Diagnosis not present

## 2015-09-16 DIAGNOSIS — R531 Weakness: Secondary | ICD-10-CM | POA: Diagnosis not present

## 2015-09-16 DIAGNOSIS — M109 Gout, unspecified: Secondary | ICD-10-CM | POA: Diagnosis not present

## 2015-09-16 DIAGNOSIS — I8390 Asymptomatic varicose veins of unspecified lower extremity: Secondary | ICD-10-CM | POA: Diagnosis not present

## 2015-09-16 DIAGNOSIS — Z87891 Personal history of nicotine dependence: Secondary | ICD-10-CM | POA: Diagnosis not present

## 2015-09-16 DIAGNOSIS — K219 Gastro-esophageal reflux disease without esophagitis: Secondary | ICD-10-CM | POA: Diagnosis not present

## 2015-09-16 DIAGNOSIS — Z4789 Encounter for other orthopedic aftercare: Secondary | ICD-10-CM | POA: Diagnosis not present

## 2015-09-19 DIAGNOSIS — R262 Difficulty in walking, not elsewhere classified: Secondary | ICD-10-CM | POA: Diagnosis not present

## 2015-09-19 DIAGNOSIS — I8390 Asymptomatic varicose veins of unspecified lower extremity: Secondary | ICD-10-CM | POA: Diagnosis not present

## 2015-09-19 DIAGNOSIS — R531 Weakness: Secondary | ICD-10-CM | POA: Diagnosis not present

## 2015-09-19 DIAGNOSIS — M160 Bilateral primary osteoarthritis of hip: Secondary | ICD-10-CM | POA: Diagnosis not present

## 2015-09-19 DIAGNOSIS — Z4789 Encounter for other orthopedic aftercare: Secondary | ICD-10-CM | POA: Diagnosis not present

## 2015-09-19 DIAGNOSIS — Z87891 Personal history of nicotine dependence: Secondary | ICD-10-CM | POA: Diagnosis not present

## 2015-09-19 DIAGNOSIS — K219 Gastro-esophageal reflux disease without esophagitis: Secondary | ICD-10-CM | POA: Diagnosis not present

## 2015-09-19 DIAGNOSIS — M109 Gout, unspecified: Secondary | ICD-10-CM | POA: Diagnosis not present

## 2015-09-19 DIAGNOSIS — I10 Essential (primary) hypertension: Secondary | ICD-10-CM | POA: Diagnosis not present

## 2015-09-23 DIAGNOSIS — M160 Bilateral primary osteoarthritis of hip: Secondary | ICD-10-CM | POA: Diagnosis not present

## 2015-09-23 DIAGNOSIS — I10 Essential (primary) hypertension: Secondary | ICD-10-CM | POA: Diagnosis not present

## 2015-09-23 DIAGNOSIS — M109 Gout, unspecified: Secondary | ICD-10-CM | POA: Diagnosis not present

## 2015-09-23 DIAGNOSIS — K219 Gastro-esophageal reflux disease without esophagitis: Secondary | ICD-10-CM | POA: Diagnosis not present

## 2015-09-23 DIAGNOSIS — R262 Difficulty in walking, not elsewhere classified: Secondary | ICD-10-CM | POA: Diagnosis not present

## 2015-09-23 DIAGNOSIS — I8390 Asymptomatic varicose veins of unspecified lower extremity: Secondary | ICD-10-CM | POA: Diagnosis not present

## 2015-09-23 DIAGNOSIS — Z4789 Encounter for other orthopedic aftercare: Secondary | ICD-10-CM | POA: Diagnosis not present

## 2015-09-23 DIAGNOSIS — R531 Weakness: Secondary | ICD-10-CM | POA: Diagnosis not present

## 2015-09-23 DIAGNOSIS — Z87891 Personal history of nicotine dependence: Secondary | ICD-10-CM | POA: Diagnosis not present

## 2015-09-27 DIAGNOSIS — Z87891 Personal history of nicotine dependence: Secondary | ICD-10-CM | POA: Diagnosis not present

## 2015-09-27 DIAGNOSIS — M109 Gout, unspecified: Secondary | ICD-10-CM | POA: Diagnosis not present

## 2015-09-27 DIAGNOSIS — I8390 Asymptomatic varicose veins of unspecified lower extremity: Secondary | ICD-10-CM | POA: Diagnosis not present

## 2015-09-27 DIAGNOSIS — R262 Difficulty in walking, not elsewhere classified: Secondary | ICD-10-CM | POA: Diagnosis not present

## 2015-09-27 DIAGNOSIS — Z4789 Encounter for other orthopedic aftercare: Secondary | ICD-10-CM | POA: Diagnosis not present

## 2015-09-27 DIAGNOSIS — K219 Gastro-esophageal reflux disease without esophagitis: Secondary | ICD-10-CM | POA: Diagnosis not present

## 2015-09-27 DIAGNOSIS — M160 Bilateral primary osteoarthritis of hip: Secondary | ICD-10-CM | POA: Diagnosis not present

## 2015-09-27 DIAGNOSIS — R531 Weakness: Secondary | ICD-10-CM | POA: Diagnosis not present

## 2015-09-27 DIAGNOSIS — I10 Essential (primary) hypertension: Secondary | ICD-10-CM | POA: Diagnosis not present

## 2015-10-01 DIAGNOSIS — K219 Gastro-esophageal reflux disease without esophagitis: Secondary | ICD-10-CM | POA: Diagnosis not present

## 2015-10-01 DIAGNOSIS — M109 Gout, unspecified: Secondary | ICD-10-CM | POA: Diagnosis not present

## 2015-10-01 DIAGNOSIS — R262 Difficulty in walking, not elsewhere classified: Secondary | ICD-10-CM | POA: Diagnosis not present

## 2015-10-01 DIAGNOSIS — Z87891 Personal history of nicotine dependence: Secondary | ICD-10-CM | POA: Diagnosis not present

## 2015-10-01 DIAGNOSIS — M160 Bilateral primary osteoarthritis of hip: Secondary | ICD-10-CM | POA: Diagnosis not present

## 2015-10-01 DIAGNOSIS — I8390 Asymptomatic varicose veins of unspecified lower extremity: Secondary | ICD-10-CM | POA: Diagnosis not present

## 2015-10-01 DIAGNOSIS — R531 Weakness: Secondary | ICD-10-CM | POA: Diagnosis not present

## 2015-10-01 DIAGNOSIS — I10 Essential (primary) hypertension: Secondary | ICD-10-CM | POA: Diagnosis not present

## 2015-10-01 DIAGNOSIS — Z4789 Encounter for other orthopedic aftercare: Secondary | ICD-10-CM | POA: Diagnosis not present

## 2015-10-04 DIAGNOSIS — R531 Weakness: Secondary | ICD-10-CM | POA: Diagnosis not present

## 2015-10-04 DIAGNOSIS — I10 Essential (primary) hypertension: Secondary | ICD-10-CM | POA: Diagnosis not present

## 2015-10-04 DIAGNOSIS — R262 Difficulty in walking, not elsewhere classified: Secondary | ICD-10-CM | POA: Diagnosis not present

## 2015-10-04 DIAGNOSIS — Z4789 Encounter for other orthopedic aftercare: Secondary | ICD-10-CM | POA: Diagnosis not present

## 2015-10-04 DIAGNOSIS — M109 Gout, unspecified: Secondary | ICD-10-CM | POA: Diagnosis not present

## 2015-10-04 DIAGNOSIS — K219 Gastro-esophageal reflux disease without esophagitis: Secondary | ICD-10-CM | POA: Diagnosis not present

## 2015-10-04 DIAGNOSIS — Z87891 Personal history of nicotine dependence: Secondary | ICD-10-CM | POA: Diagnosis not present

## 2015-10-04 DIAGNOSIS — M160 Bilateral primary osteoarthritis of hip: Secondary | ICD-10-CM | POA: Diagnosis not present

## 2015-10-04 DIAGNOSIS — I8390 Asymptomatic varicose veins of unspecified lower extremity: Secondary | ICD-10-CM | POA: Diagnosis not present

## 2015-11-11 ENCOUNTER — Emergency Department (EMERGENCY_DEPARTMENT_HOSPITAL)
Admit: 2015-11-11 | Discharge: 2015-11-11 | Disposition: A | Discharge status: Home / Self Care | Payer: Commercial Managed Care - HMO | Attending: Emergency Medicine | Admitting: Emergency Medicine

## 2015-11-11 ENCOUNTER — Emergency Department (HOSPITAL_COMMUNITY)
Admission: EM | Admit: 2015-11-11 | Discharge: 2015-11-11 | Disposition: A | Discharge status: Home / Self Care | Payer: Commercial Managed Care - HMO | Attending: Emergency Medicine | Admitting: Emergency Medicine

## 2015-11-11 ENCOUNTER — Encounter (HOSPITAL_COMMUNITY): Payer: Self-pay | Admitting: *Deleted

## 2015-11-11 DIAGNOSIS — M79662 Pain in left lower leg: Secondary | ICD-10-CM | POA: Insufficient documentation

## 2015-11-11 DIAGNOSIS — M79605 Pain in left leg: Secondary | ICD-10-CM | POA: Diagnosis not present

## 2015-11-11 DIAGNOSIS — Z79899 Other long term (current) drug therapy: Secondary | ICD-10-CM | POA: Insufficient documentation

## 2015-11-11 DIAGNOSIS — G4730 Sleep apnea, unspecified: Secondary | ICD-10-CM | POA: Diagnosis not present

## 2015-11-11 DIAGNOSIS — R2242 Localized swelling, mass and lump, left lower limb: Secondary | ICD-10-CM | POA: Insufficient documentation

## 2015-11-11 DIAGNOSIS — Z7982 Long term (current) use of aspirin: Secondary | ICD-10-CM | POA: Diagnosis not present

## 2015-11-11 DIAGNOSIS — M1990 Unspecified osteoarthritis, unspecified site: Secondary | ICD-10-CM | POA: Insufficient documentation

## 2015-11-11 DIAGNOSIS — K219 Gastro-esophageal reflux disease without esophagitis: Secondary | ICD-10-CM | POA: Insufficient documentation

## 2015-11-11 DIAGNOSIS — I10 Essential (primary) hypertension: Secondary | ICD-10-CM | POA: Diagnosis not present

## 2015-11-11 DIAGNOSIS — Z9981 Dependence on supplemental oxygen: Secondary | ICD-10-CM | POA: Insufficient documentation

## 2015-11-11 DIAGNOSIS — Z87891 Personal history of nicotine dependence: Secondary | ICD-10-CM | POA: Diagnosis not present

## 2015-11-11 MED ORDER — OXYCODONE-ACETAMINOPHEN 5-325 MG PO TABS
1.0000 | ORAL_TABLET | Freq: Four times a day (QID) | ORAL | Status: DC | PRN
Start: 2015-11-11 — End: 2016-03-24

## 2015-11-11 MED ORDER — OXYCODONE-ACETAMINOPHEN 5-325 MG PO TABS
2.0000 | ORAL_TABLET | Freq: Once | ORAL | Status: AC
  Administered 2015-11-11: 2 via ORAL
  Filled 2015-11-11: qty 2

## 2015-11-11 NOTE — ED Notes (Signed)
Pt reports having spinal surgery 2 months ago a welsey long and now has pain and swelling in his left leg. Skin warm and dry with some swelling noted.

## 2015-11-11 NOTE — ED Provider Notes (Signed)
CSN: [id number]     Arrival date & time 11/11/15  0755 History   First MD Initiated Contact with Patient 11/11/15 [phone]     Chief Complaint  Patient presents with  . Leg Swelling      HPI Patient presents to the emergency department complaining of discomfort pain and swelling in his left lower extremity.  No history DVT or pulmonary embolism.  No numbness or tingling in his legs.  He denies weakness of his left leg.  States it is painful to move and locates the pain to his distal left thigh when he moves his leg.  He reports is painful to walk.  No injury or fall.  He has been exercising at the [facility].  He does not remember injuring himself.  He has not tried any medication for pain.  Past Medical History  Diagnosis Date  . Hypertension   . GERD (gastroesophageal reflux disease)   . Varicose veins   . Sleep apnea     pt states does not use CPAP machine   . Numbness and tingling     fingertips bilat   . Shortness of breath dyspnea     walking distances or climbing stairs  . Arthritis    Past Surgical History  Procedure Laterality Date  . Endovenous ablation saphenous vein w/ laser Right 05-31-2014    EVLA  right gretaer saphenous vein by Curt Jews MD  . Lumbar laminectomy/decompression microdiscectomy N/A 09/04/2015    Procedure: MICRO LUMBAR DECOMPRESSION L4 - L5,  L3 - L4 2 LEVELS;  Surgeon: Susa Day, MD;  Location: [facility];  Service: Orthopedics;  Laterality: N/A;   Family History  Problem Relation Age of Onset  . Kidney disease Mother    Social History  Substance Use Topics  . Smoking status: Former Smoker -- 5 years    Types: Cigars    Quit date: 09/28/1968  . Smokeless tobacco: Never Used  . Alcohol Use: No    Review of Systems  All other systems reviewed and are negative.     Allergies  Review of patient's allergies indicates no known allergies.  Home Medications   Prior to Admission medications   Medication Sig Start Date End Date Taking? Authorizing  Provider  allopurinol (ZYLOPRIM) 300 MG tablet Take 300 mg by mouth daily.   Yes Historical Provider, MD  amLODipine (NORVASC) 10 MG tablet Take 10 mg by mouth daily.   Yes Historical Provider, MD  aspirin EC 81 MG tablet Take 81 mg by mouth daily.   Yes Historical Provider, MD  citalopram (CELEXA) 40 MG tablet Take 40 mg by mouth daily.   Yes Historical Provider, MD  labetalol (NORMODYNE) 300 MG tablet Take 300 mg by mouth 3 (three) times daily.   Yes Historical Provider, MD  omeprazole (PRILOSEC) 40 MG capsule Take 40 mg by mouth daily.   Yes Historical Provider, MD  traZODone (DESYREL) 100 MG tablet Take 1 tablet by mouth at bedtime. 06/21/15  Yes Historical Provider, MD  valsartan (DIOVAN) 320 MG tablet Take 1 tablet by mouth daily. 06/14/15  Yes Historical Provider, MD  oxyCODONE-acetaminophen (PERCOCET/ROXICET) 5-325 MG tablet Take 1 tablet by mouth every 6 (six) hours as needed for severe pain. 11/11/15   Jola Schmidt, MD   BP 185/105 mmHg  Pulse 96  Temp(Src) 98.7 F (37.1 C) (Oral)  Resp 18  SpO2 99% Physical Exam  Constitutional: He is oriented to person, place, and time. He appears well-developed and well-nourished.  HENT:  Head: Normocephalic and atraumatic.  Eyes: EOM are normal.  Neck: Normal range of motion.  Cardiovascular: Normal rate and regular rhythm.   Pulmonary/Chest: Effort normal and breath sounds normal. No respiratory distress.  Abdominal: Soft.  Musculoskeletal:  Mild swelling of the left lower extremity as compared to the right.  Full range of motion of left hip, left knee, left ankle.  Normal PT and DP pulses the left foot.  Compartments of left lower extremity are soft.  No obvious medial thigh swelling on the left.  No lymphadenopathy noted in the left groin.  No rash noted.  No erythema or warmth.  Neurological: He is alert and oriented to person, place, and time.  Skin: Skin is warm and dry.  Psychiatric: He has a normal mood and affect. Judgment normal.   Nursing note and vitals reviewed.   ED Course  Procedures (including critical care time) Labs Review Labs Reviewed - No data to display  Imaging Review No results found. I have personally reviewed and evaluated these images and lab results as part of my medical decision-making.   EKG Interpretation None      MDM   Final diagnoses:  Pain of left lower extremity    Venous duplex negative for DVT in the left lower extremity.  Patient with normal arterial pulses in left foot.  His pain is improved with pain medication here.  I'm fully able to range his left knee and left hip.  Doubt septic arthritis.  Home with a short course of pain medication and primary care follow-up.  He understands to return to the ER for new or worsening symptoms    Jola Schmidt, MD 11/11/15 1148

## 2015-11-11 NOTE — Discharge Instructions (Signed)

## 2015-11-11 NOTE — Progress Notes (Signed)
VASCULAR LAB PRELIMINARY  PRELIMINARY  PRELIMINARY  PRELIMINARY  Left lower extremity venous duplex completed.    Preliminary report:  There is no DVT or SVT noted in the left lower extremity.   Juliene Kirsh, RVT 11/11/2015, 10:41 AM

## 2015-11-12 DIAGNOSIS — G4700 Insomnia, unspecified: Secondary | ICD-10-CM | POA: Diagnosis not present

## 2015-11-12 DIAGNOSIS — M5137 Other intervertebral disc degeneration, lumbosacral region: Secondary | ICD-10-CM | POA: Diagnosis not present

## 2015-11-12 DIAGNOSIS — I10 Essential (primary) hypertension: Secondary | ICD-10-CM | POA: Diagnosis not present

## 2015-11-28 DIAGNOSIS — M1611 Unilateral primary osteoarthritis, right hip: Secondary | ICD-10-CM | POA: Diagnosis not present

## 2015-12-11 DIAGNOSIS — Z9889 Other specified postprocedural states: Secondary | ICD-10-CM | POA: Diagnosis not present

## 2015-12-11 DIAGNOSIS — Z4789 Encounter for other orthopedic aftercare: Secondary | ICD-10-CM | POA: Diagnosis not present

## 2015-12-11 DIAGNOSIS — M1611 Unilateral primary osteoarthritis, right hip: Secondary | ICD-10-CM | POA: Diagnosis not present

## 2016-01-01 DIAGNOSIS — D126 Benign neoplasm of colon, unspecified: Secondary | ICD-10-CM | POA: Diagnosis not present

## 2016-01-01 DIAGNOSIS — D120 Benign neoplasm of cecum: Secondary | ICD-10-CM | POA: Diagnosis not present

## 2016-01-01 DIAGNOSIS — K5730 Diverticulosis of large intestine without perforation or abscess without bleeding: Secondary | ICD-10-CM | POA: Diagnosis not present

## 2016-01-01 DIAGNOSIS — Z86010 Personal history of colonic polyps: Secondary | ICD-10-CM | POA: Diagnosis not present

## 2016-01-03 DIAGNOSIS — M4806 Spinal stenosis, lumbar region: Secondary | ICD-10-CM | POA: Diagnosis not present

## 2016-01-07 DIAGNOSIS — M4806 Spinal stenosis, lumbar region: Secondary | ICD-10-CM | POA: Diagnosis not present

## 2016-01-13 DIAGNOSIS — M4806 Spinal stenosis, lumbar region: Secondary | ICD-10-CM | POA: Diagnosis not present

## 2016-01-22 DIAGNOSIS — Z4789 Encounter for other orthopedic aftercare: Secondary | ICD-10-CM | POA: Diagnosis not present

## 2016-01-28 DIAGNOSIS — M1611 Unilateral primary osteoarthritis, right hip: Secondary | ICD-10-CM | POA: Diagnosis not present

## 2016-02-07 ENCOUNTER — Ambulatory Visit
Admission: RE | Admit: 2016-02-07 | Discharge: 2016-02-07 | Disposition: A | Discharge status: Home / Self Care | Payer: Commercial Managed Care - HMO | Source: Ambulatory Visit | Attending: Family Medicine | Admitting: Family Medicine

## 2016-02-07 ENCOUNTER — Other Ambulatory Visit: Payer: Self-pay | Admitting: Family Medicine

## 2016-02-07 DIAGNOSIS — M25551 Pain in right hip: Secondary | ICD-10-CM | POA: Diagnosis not present

## 2016-02-07 DIAGNOSIS — Z01818 Encounter for other preprocedural examination: Secondary | ICD-10-CM

## 2016-02-07 DIAGNOSIS — D649 Anemia, unspecified: Secondary | ICD-10-CM | POA: Diagnosis not present

## 2016-02-07 DIAGNOSIS — Z79899 Other long term (current) drug therapy: Secondary | ICD-10-CM | POA: Diagnosis not present

## 2016-02-07 DIAGNOSIS — M109 Gout, unspecified: Secondary | ICD-10-CM | POA: Diagnosis not present

## 2016-02-07 DIAGNOSIS — I10 Essential (primary) hypertension: Secondary | ICD-10-CM | POA: Diagnosis not present

## 2016-02-07 DIAGNOSIS — F5101 Primary insomnia: Secondary | ICD-10-CM | POA: Diagnosis not present

## 2016-02-07 DIAGNOSIS — K219 Gastro-esophageal reflux disease without esophagitis: Secondary | ICD-10-CM | POA: Diagnosis not present

## 2016-03-11 ENCOUNTER — Ambulatory Visit: Payer: Self-pay | Admitting: Orthopedic Surgery

## 2016-03-11 ENCOUNTER — Telehealth: Payer: Self-pay | Admitting: Internal Medicine

## 2016-03-11 NOTE — Telephone Encounter (Signed)
1. Type of surgery: right hip: total THA-DA 2. Date of surgery: pending 3. Surgeon: Dr. Aaron Edelman Swinteck 4. Medications that need to be held & how long: none specified (takes aspirin 81mg  QD per med list) 5. Fax and/or Phone: (f) [phone]  (p) [phone]  -- Santiago Bur - surgical scheduler

## 2016-03-12 NOTE — Telephone Encounter (Signed)
Please schedule appointment prior to 6/29 for clearance.  Dr. Lemmie Evens

## 2016-03-12 NOTE — Telephone Encounter (Signed)
Patient scheduled for appointment 03/24/16 w/C. Sharolyn Douglas, NP for cardiac clearance

## 2016-03-17 ENCOUNTER — Ambulatory Visit: Payer: Self-pay | Admitting: Orthopedic Surgery

## 2016-03-17 NOTE — H&P (Signed)
TOTAL HIP ADMISSION H&P  Patient is admitted for right total hip arthroplasty.  Subjective:  Chief Complaint: right hip pain  HPI: John Hanson, 75 y.o. male, has a history of pain and functional disability in the right hip(s) due to arthritis and patient has failed non-surgical conservative treatments for greater than 12 weeks to include NSAID's and/or analgesics, corticosteriod injections, flexibility and strengthening excercises, use of assistive devices, weight reduction as appropriate and activity modification.  Onset of symptoms was gradual starting 1 years ago with gradually worsening course since that time.The patient noted no past surgery on the right hip(s).  Patient currently rates pain in the right hip at 10 out of 10 with activity. Patient has night pain, worsening of pain with activity and weight bearing, trendelenberg gait, pain that interfers with activities of daily living, pain with passive range of motion and crepitus. Patient has evidence of subchondral cysts, subchondral sclerosis, periarticular osteophytes and joint space narrowing by imaging studies. This condition presents safety issues increasing the risk of falls. There is no current active infection.  Patient Active Problem List   Diagnosis Date Noted  . Spinal stenosis of lumbar region 09/04/2015  . HTN (hypertension) 07/18/2015  . Murmur 07/18/2015  . Preoperative cardiovascular examination 07/18/2015  . Varicose veins of lower extremities with other complications [id number]  . Pain in limb 02/08/2014   Past Medical History  Diagnosis Date  . Hypertension   . GERD (gastroesophageal reflux disease)   . Varicose veins   . Sleep apnea     pt states does not use CPAP machine   . Numbness and tingling     fingertips bilat   . Shortness of breath dyspnea     walking distances or climbing stairs  . Arthritis     Past Surgical History  Procedure Laterality Date  . Endovenous ablation saphenous vein w/  laser Right 05-31-2014    EVLA  right gretaer saphenous vein by Curt Jews MD  . Lumbar laminectomy/decompression microdiscectomy N/A 09/04/2015    Procedure: MICRO LUMBAR DECOMPRESSION L4 - L5,  L3 - L4 2 LEVELS;  Surgeon: Susa Day, MD;  Location: [facility];  Service: Orthopedics;  Laterality: N/A;     (Not in a hospital admission) No Known Allergies  Social History  Substance Use Topics  . Smoking status: Former Smoker -- 5 years    Types: Cigars    Quit date: 09/28/1968  . Smokeless tobacco: Never Used  . Alcohol Use: No    Family History  Problem Relation Age of Onset  . Kidney disease Mother      Review of Systems  Constitutional: Negative.   HENT: Negative.   Eyes: Negative.   Respiratory: Negative.   Cardiovascular: Negative.   Gastrointestinal: Negative.   Genitourinary: Negative.   Musculoskeletal: Positive for back pain and joint pain.  Skin: Negative.   Neurological: Negative.   Endo/Heme/Allergies: Negative.   Psychiatric/Behavioral: Negative.     Objective:  Physical Exam  Vitals reviewed. Constitutional: He is oriented to person, place, and time. He appears well-developed and well-nourished.  HENT:  Head: Normocephalic and atraumatic.  Neck: Normal range of motion. Neck supple.  Cardiovascular: Normal rate, regular rhythm and normal heart sounds.   Respiratory: Effort normal and breath sounds normal.  GI: Soft. Bowel sounds are normal.  Genitourinary:  deferred  Musculoskeletal:       Right hip: He exhibits decreased range of motion.  Neurological: He is alert and oriented to person, place, and time.  He has normal reflexes.  Skin: Skin is warm and dry.  Psychiatric: He has a normal mood and affect. His behavior is normal. Judgment and thought content normal.    Vital signs in last 24 hours: @VSRANGES @  Labs:   Estimated body mass index is 36.74 kg/(m^2) as calculated from the following:   Height as of 09/04/15: 5' 8.5" (1.74 m).   Weight  as of 08/29/15: 111.245 kg (245 lb 4 oz).   Imaging Review Plain radiographs demonstrate severe degenerative joint disease of the bilateral hip(s). The bone quality appears to be adequate for age and reported activity level.  Assessment/Plan:  End stage arthritis, right hip(s)  The patient history, physical examination, clinical judgement of the provider and imaging studies are consistent with end stage degenerative joint disease of the right hip(s) and total hip arthroplasty is deemed medically necessary. The treatment options including medical management, injection therapy, arthroscopy and arthroplasty were discussed at length. The risks and benefits of total hip arthroplasty were presented and reviewed. The risks due to aseptic loosening, infection, stiffness, dislocation/subluxation,  thromboembolic complications and other imponderables were discussed.  The patient acknowledged the explanation, agreed to proceed with the plan and consent was signed. Patient is being admitted for inpatient treatment for surgery, pain control, PT, OT, prophylactic antibiotics, VTE prophylaxis, progressive ambulation and ADL's and discharge planning.The patient is planning to be discharged home with home health services

## 2016-03-18 ENCOUNTER — Encounter (HOSPITAL_COMMUNITY)
Admission: RE | Admit: 2016-03-18 | Discharge: 2016-03-18 | Disposition: A | Discharge status: Home / Self Care | Payer: Commercial Managed Care - HMO | Source: Ambulatory Visit | Attending: Orthopedic Surgery | Admitting: Orthopedic Surgery

## 2016-03-18 ENCOUNTER — Encounter (HOSPITAL_COMMUNITY): Payer: Self-pay

## 2016-03-18 DIAGNOSIS — Z01812 Encounter for preprocedural laboratory examination: Secondary | ICD-10-CM | POA: Insufficient documentation

## 2016-03-18 DIAGNOSIS — M1611 Unilateral primary osteoarthritis, right hip: Secondary | ICD-10-CM | POA: Insufficient documentation

## 2016-03-18 DIAGNOSIS — Z0183 Encounter for blood typing: Secondary | ICD-10-CM | POA: Diagnosis not present

## 2016-03-18 HISTORY — DX: Anxiety disorder, unspecified: F41.9

## 2016-03-18 HISTORY — DX: Depression, unspecified: F32.A

## 2016-03-18 HISTORY — DX: Major depressive disorder, single episode, unspecified: F32.9

## 2016-03-18 LAB — CBC
HCT: 36.3 % — ABNORMAL LOW (ref 39.0–52.0)
Hemoglobin: 12.3 g/dL — ABNORMAL LOW (ref 13.0–17.0)
MCH: 32.8 pg (ref 26.0–34.0)
MCHC: 33.9 g/dL (ref 30.0–36.0)
MCV: 96.8 fL (ref 78.0–100.0)
PLATELETS: 193 10*3/uL (ref 150–400)
RBC: 3.75 MIL/uL — ABNORMAL LOW (ref 4.22–5.81)
RDW: 13.3 % (ref 11.5–15.5)
WBC: 5 10*3/uL (ref 4.0–10.5)

## 2016-03-18 LAB — SURGICAL PCR SCREEN
MRSA, PCR: NEGATIVE
Staphylococcus aureus: NEGATIVE

## 2016-03-18 LAB — BASIC METABOLIC PANEL
Anion gap: 7 (ref 5–15)
BUN: 20 mg/dL (ref 6–20)
CALCIUM: 9.2 mg/dL (ref 8.9–10.3)
CO2: 26 mmol/L (ref 22–32)
Chloride: 107 mmol/L (ref 101–111)
Creatinine, Ser: 0.92 mg/dL (ref 0.61–1.24)
GFR calc Af Amer: >60 mL/min (ref >60)
GLUCOSE: 102 mg/dL — AB (ref 65–99)
Potassium: 3.7 mmol/L (ref 3.5–5.1)
Sodium: 140 mmol/L (ref 135–145)

## 2016-03-18 LAB — ABO/RH: ABO/RH(D): O POS

## 2016-03-18 NOTE — Patient Instructions (Addendum)
TRAJEN SOWLE  03/18/2016   Your procedure is scheduled on: Thursday 03/26/2016  Report to [facility]  Entrance take [place]  elevators to 3rd floor to  [place] at  1045 AM.  Call this number if you have problems the morning of surgery [phone]   Remember: ONLY 1 PERSON MAY GO WITH YOU TO SHORT STAY TO GET  READY MORNING OF [place].   Do not eat food or drink liquids :After Midnight.     Take these medicines the morning of surgery with A SIP OF WATER: Amlodipine, Citalopram, Labetalol, Omeprazole                                 You may not have any metal on your body including hair pins and              piercings  Do not wear jewelry, make-up, lotions, powders or perfumes, deodorant             Do not wear nail polish.  Do not shave  48 hours prior to surgery.              Men may shave face and neck.   Do not bring valuables to the hospital. [place].  Contacts, dentures or bridgework may not be worn into surgery.  Leave suitcase in the car. After surgery it may be brought to your room.                  Please read over the following fact sheets you were given: _____________________________________________________________________             [facility] - Preparing for Surgery Before surgery, you can play an important role.  Because skin is not sterile, your skin needs to be as free of germs as possible.  You can reduce the number of germs on your skin by washing with CHG (chlorahexidine gluconate) soap before surgery.  CHG is an antiseptic cleaner which kills germs and bonds with the skin to continue killing germs even after washing. Please DO NOT use if you have an allergy to CHG or antibacterial soaps.  If your skin becomes reddened/irritated stop using the CHG and inform your nurse when you arrive at Short Stay. Do not shave (including legs and underarms) for at least 48  hours prior to the first CHG shower.  You may shave your face/neck. Please follow these instructions carefully:  1.  Shower with CHG Soap the night before surgery and the  morning of Surgery.  2.  If you choose to wash your hair, wash your hair first as usual with your  normal  shampoo.  3.  After you shampoo, rinse your hair and body thoroughly to remove the  shampoo.                           4.  Use CHG as you would any other liquid soap.  You can apply chg directly  to the skin and wash                       Gently with a scrungie or clean washcloth.  5.  Apply the CHG Soap to your body ONLY FROM THE NECK DOWN.   Do not use on face/ open                           Wound or open sores. Avoid contact with eyes, ears mouth and genitals (private parts).                       Wash face,  Genitals (private parts) with your normal soap.             6.  Wash thoroughly, paying special attention to the area where your surgery  will be performed.  7.  Thoroughly rinse your body with warm water from the neck down.  8.  DO NOT shower/wash with your normal soap after using and rinsing off  the CHG Soap.                9.  Pat yourself dry with a clean towel.            10.  Wear clean pajamas.            11.  Place clean sheets on your bed the night of your first shower and do not  sleep with pets. Day of Surgery : Do not apply any lotions/deodorants the morning of surgery.  Please wear clean clothes to the hospital/surgery center.  FAILURE TO FOLLOW THESE INSTRUCTIONS MAY RESULT IN THE CANCELLATION OF YOUR SURGERY PATIENT SIGNATURE_________________________________  NURSE SIGNATURE__________________________________  ________________________________________________________________________   [name]  An incentive spirometer is a tool that can help keep your lungs clear and active. This tool measures how well you are filling your lungs with each breath. Taking long deep breaths may help  reverse or decrease the chance of developing breathing (pulmonary) problems (especially infection) following:  A long period of time when you are unable to move or be active. BEFORE THE PROCEDURE   If the spirometer includes an indicator to show your best effort, your nurse or respiratory therapist will set it to a desired goal.  If possible, sit up straight or lean slightly forward. Try not to slouch.  Hold the incentive spirometer in an upright position. INSTRUCTIONS FOR USE   Sit on the edge of your bed if possible, or sit up as far as you can in bed or on a chair.  Hold the incentive spirometer in an upright position.  Breathe out normally.  Place the mouthpiece in your mouth and seal your lips tightly around it.  Breathe in slowly and as deeply as possible, raising the piston or the ball toward the top of the column.  Hold your breath for 3-5 seconds or for as long as possible. Allow the piston or ball to fall to the bottom of the column.  Remove the mouthpiece from your mouth and breathe out normally.  Rest for a few seconds and repeat Steps 1 through 7 at least 10 times every 1-2 hours when you are awake. Take your time and take a few normal breaths between deep breaths.  The spirometer may include an indicator to show your best effort. Use the indicator as a goal to work toward during each repetition.  After each set of 10 deep breaths, practice coughing to be sure your lungs are clear. If you have an incision (the cut made at the time of surgery), support your incision when coughing by placing a  pillow or rolled up towels firmly against it. Once you are able to get out of bed, walk around indoors and cough well. You may stop using the incentive spirometer when instructed by your caregiver.  RISKS AND COMPLICATIONS  Take your time so you do not get dizzy or light-headed.  If you are in pain, you may need to take or ask for pain medication before doing incentive spirometry.  It is harder to take a deep breath if you are having pain. AFTER USE  Rest and breathe slowly and easily.  It can be helpful to keep track of a log of your progress. Your caregiver can provide you with a simple table to help with this. If you are using the spirometer at home, follow these instructions: [place] IF:   You are having difficultly using the spirometer.  You have trouble using the spirometer as often as instructed.  Your pain medication is not giving enough relief while using the spirometer.  You develop fever of 100.5 F (38.1 C) or higher. SEEK IMMEDIATE MEDICAL CARE IF:   You cough up bloody sputum that had not been present before.  You develop fever of 102 F (38.9 C) or greater.  You develop worsening pain at or near the incision site. MAKE SURE YOU:   Understand these instructions.  Will watch your condition.  Will get help right away if you are not doing well or get worse. Document Released: 01/25/2007 Document Revised: 12/07/2011 Document Reviewed: 03/28/2007 Exit[employer] Patient Information 2014 Exit[employer], [place].   ________________________________________________________________________  WHAT IS A BLOOD TRANSFUSION? Blood Transfusion Information  A transfusion is the replacement of blood or some of its parts. Blood is made up of multiple cells which provide different functions.  Red blood cells carry oxygen and are used for blood loss replacement.  White blood cells fight against infection.  Platelets control bleeding.  Plasma helps clot blood.  Other blood products are available for specialized needs, such as hemophilia or other clotting disorders. BEFORE THE TRANSFUSION  Who gives blood for transfusions?   Healthy volunteers who are fully evaluated to make sure their blood is safe. This is blood bank blood. Transfusion therapy is the safest it has ever been in the practice of medicine. Before blood is taken from a donor, a complete  history is taken to make sure that person has no history of diseases nor engages in risky social behavior (examples are intravenous drug use or sexual activity with multiple partners). The donor's travel history is screened to minimize risk of transmitting infections, such as malaria. The donated blood is tested for signs of infectious diseases, such as HIV and hepatitis. The blood is then tested to be sure it is compatible with you in order to minimize the chance of a transfusion reaction. If you or a relative donates blood, this is often done in anticipation of surgery and is not appropriate for emergency situations. It takes many days to process the donated blood. RISKS AND COMPLICATIONS Although transfusion therapy is very safe and saves many lives, the main dangers of transfusion include:   Getting an infectious disease.  Developing a transfusion reaction. This is an allergic reaction to something in the blood you were given. Every precaution is taken to prevent this. The decision to have a blood transfusion has been considered carefully by your caregiver before blood is given. Blood is not given unless the benefits outweigh the risks. AFTER THE TRANSFUSION  Right after receiving a blood transfusion, you  will usually feel much better and more energetic. This is especially true if your red blood cells have gotten low (anemic). The transfusion raises the level of the red blood cells which carry oxygen, and this usually causes an energy increase.  The nurse administering the transfusion will monitor you carefully for complications. HOME CARE INSTRUCTIONS  No special instructions are needed after a transfusion. You may find your energy is better. Speak with your caregiver about any limitations on activity for underlying diseases you may have. SEEK MEDICAL CARE IF:   Your condition is not improving after your transfusion.  You develop redness or irritation at the intravenous (IV) site. SEEK  IMMEDIATE MEDICAL CARE IF:  Any of the following symptoms occur over the next 12 hours:  Shaking chills.  You have a temperature by mouth above 102 F (38.9 C), not controlled by medicine.  Chest, back, or muscle pain.  People around you feel you are not acting correctly or are confused.  Shortness of breath or difficulty breathing.  Dizziness and fainting.  You get a rash or develop hives.  You have a decrease in urine output.  Your urine turns a dark color or changes to pink, red, or brown. Any of the following symptoms occur over the next 10 days:  You have a temperature by mouth above 102 F (38.9 C), not controlled by medicine.  Shortness of breath.  Weakness after normal activity.  The white part of the eye turns yellow (jaundice).  You have a decrease in the amount of urine or are urinating less often.  Your urine turns a dark color or changes to pink, red, or brown. Document Released: 09/11/2000 Document Revised: 12/07/2011 Document Reviewed: 04/30/2008 [facility] Patient Information 2014 [place], [place].  _______________________________________________________________________

## 2016-03-18 NOTE — Progress Notes (Signed)
01/29/16/17-pre-op clearance from Dr. Dorthy Cooler on chart. Pre-op clearance from Dr. Derenda Mis, DDS on chart 07/26/15-pre-op clearance from Dr. Debara Pickett on chart.

## 2016-03-24 ENCOUNTER — Encounter: Payer: Self-pay | Admitting: Nurse Practitioner

## 2016-03-24 ENCOUNTER — Ambulatory Visit (INDEPENDENT_AMBULATORY_CARE_PROVIDER_SITE_OTHER): Payer: Commercial Managed Care - HMO | Admitting: Nurse Practitioner

## 2016-03-24 ENCOUNTER — Ambulatory Visit: Payer: Commercial Managed Care - HMO | Admitting: Nurse Practitioner

## 2016-03-24 VITALS — BP 140/82 | HR 75 | Ht 68.0 in | Wt 244.4 lb

## 2016-03-24 DIAGNOSIS — I350 Nonrheumatic aortic (valve) stenosis: Secondary | ICD-10-CM | POA: Diagnosis not present

## 2016-03-24 DIAGNOSIS — I10 Essential (primary) hypertension: Secondary | ICD-10-CM

## 2016-03-24 DIAGNOSIS — M160 Bilateral primary osteoarthritis of hip: Secondary | ICD-10-CM | POA: Diagnosis not present

## 2016-03-24 DIAGNOSIS — M1990 Unspecified osteoarthritis, unspecified site: Secondary | ICD-10-CM | POA: Insufficient documentation

## 2016-03-24 DIAGNOSIS — Z01810 Encounter for preprocedural cardiovascular examination: Secondary | ICD-10-CM | POA: Diagnosis not present

## 2016-03-24 MED ORDER — FUROSEMIDE 20 MG PO TABS: 20.0000 mg | ORAL_TABLET | Freq: Every day | ORAL | Status: DC

## 2016-03-24 NOTE — Patient Instructions (Addendum)
Your physician has recommended you make the following change in your medication:   Start new prescription for furosemide 20 mg daily. This has been sent to your [place].  Your physician wants you to follow-up in: 6 months or sooner if needed with Dr Debara Pickett. You will receive a reminder letter in the mail two months in advance. If you don't receive a letter, please call our office to schedule the follow-up appointment.

## 2016-03-24 NOTE — Progress Notes (Signed)
Office Visit    Patient Name: John Hanson Date of Encounter: 03/24/2016  Primary Care Provider:  Lujean Amel, MD Primary Cardiologist:  C. Hilty, MD   Chief Complaint    75 year old male with a history of hypertension, mild aortic stenosis, obesity, and arthritis pending right total hip arthroplasty, who presents for preoperative evaluation.  Past Medical History    Past Medical History  Diagnosis Date  . Essential hypertension   . GERD (gastroesophageal reflux disease)   . Varicose veins   . Sleep apnea     a. pt states does not use CPAP machine   . Numbness and tingling     fingertips bilat   . Arthritis   . Anxiety   . Depression   . Aortic stenosis     a. 06/2015 Echo: EF 50-55%, mild diff HK, Gr1 DD, mild AS w/ restricted mobility of R coronary and non-coronary cusp, mildly dil Ao root (35mm), mildly dil LA, nl RV fxn, mild TR/PR, PASP 89mmHg.  . Osteoarthritis     a. R>L hip pending R THA.   Past Surgical History  Procedure Laterality Date  . Endovenous ablation saphenous vein w/ laser Right 05-31-2014    EVLA  right gretaer saphenous vein by Curt Jews MD  . Lumbar laminectomy/decompression microdiscectomy N/A 09/04/2015    Procedure: MICRO LUMBAR DECOMPRESSION L4 - L5,  L3 - L4 2 LEVELS;  Surgeon: Susa Day, MD;  Location: [facility];  Service: Orthopedics;  Laterality: N/A;    Allergies  No Known Allergies  History of Present Illness    75 year old male with the above past medical history including hypertension, obesity, and osteoarthritis. He was last seen in clinic in October 2016 for preoperative evaluation in the setting of mildly abnormal ECG and systolic murmur. An echo was performed and showed normal LV function with mild aortic stenosis and grade 1 diastolic dysfunction. He subsequently underwent lumbar laminectomy and decompression in December 2016 and did well postoperatively. Shortly thereafter, he began having right hip discomfort and  following evaluation, it is ultimately been determined that he will require right total hip arthroplasty. He has not been having any chest pain or dyspnea and up until recently, was fairly active. He denies PND, orthopnea, dizziness, syncope, or early satiety. In the setting of reduced activity and sitting more often, he has noted some increasing lower extremity swelling. Notably, he used to take Lasix but stopped taking it because of August having run to the bathroom.  Home Medications    Prior to Admission medications   Medication Sig Start Date End Date Taking? Authorizing Provider  allopurinol (ZYLOPRIM) 300 MG tablet Take 300 mg by mouth daily.   Yes Historical Provider, MD  amLODipine (NORVASC) 10 MG tablet Take 10 mg by mouth daily.   Yes Historical Provider, MD  aspirin EC 81 MG tablet Take 81 mg by mouth daily.   Yes Historical Provider, MD  citalopram (CELEXA) 40 MG tablet Take 40 mg by mouth daily.   Yes Historical Provider, MD  labetalol (NORMODYNE) 300 MG tablet Take 300 mg by mouth 3 (three) times daily.   Yes Historical Provider, MD  omeprazole (PRILOSEC) 40 MG capsule Take 40 mg by mouth daily.   Yes Historical Provider, MD  traZODone (DESYREL) 150 MG tablet Take 150 mg by mouth at bedtime. 02/07/16  Yes Historical Provider, MD  valsartan (DIOVAN) 320 MG tablet Take 320 mg by mouth daily.  06/14/15  Yes Historical Provider, MD  furosemide (LASIX)  20 MG tablet Take 1 tablet (20 mg total) by mouth daily. 03/24/16   Rogelia Mire, NP    Review of Systems    As above, he's been having right hip pain and is pending total hip arthroplasty. He has had some increasing lower extremity swelling, left greater than right. No chest pain, dyspnea, PND, orthopnea, dizziness, syncope, or early satiety..  All other systems reviewed and are otherwise negative except as noted above.  Physical Exam    VS:  BP 140/82 mmHg  Pulse 75  Ht 5\' 8"  (1.727 m)  Wt 244 lb 6.4 oz (110.859 kg)  BMI  37.17 kg/m2 , BMI Body mass index is 37.17 kg/(m^2). GEN: Well nourished, well developed, in no acute distress. HEENT: normal. Neck: Supple, no JVD, carotid bruits, or masses. Cardiac: RRR, 2/6 systolic ejection murmur at the upper sternal border, no rubs, or gallops. No clubbing, cyanosis, trace right lower extremity edema and 1+ left lower extremity edema.  Radials/DP/PT 2+ and equal bilaterally.  Respiratory:  Respirations regular and unlabored, clear to auscultation bilaterally. GI: Soft, nontender, nondistended, BS + x 4. MS: no deformity or atrophy. Skin: warm and dry, no rash. Neuro:  Strength and sensation are intact. Psych: Normal affect.  Accessory Clinical Findings    ECG - Sinus rhythm, 75, leftward axis, LVH with repolarization abnormalities.  Assessment & Plan    1.  Preoperative cardiovascular examination/osteoarthritis: Patient is pending left total hip arthroplasty. He had an echocardiogram last fall in the setting of systolic murmur and mildly abnormal EKG. This showed normal LV function without wall motion abnormalities. He did have mild aortic stenosis. He underwent lumbar laminectomy in December 2016 and did well perioperatively. Up until recently, he was fairly active w/o limitations, though now his hips limit his activity.  He has not been having any chest pain or dyspnea. In that setting, he will not require any additional ischemic evaluation at this time and may proceed to surgery. He should continue beta blocker therapy throughout the perioperative period.  2. Mild aortic stenosis: This was noted on echo in October 2016, which was performed secondary to systolic murmur. No further workup at this time required.  3. Essential hypertension: Blood pressure is mildly elevated today. He is on amlodipine, labetalol, and Diovan. He had previously been on Lasix 20 mg daily as well and he stopped it secondary to having to run to the bathroom fairly often. In the setting of mild  increase in lower extremity swelling, I recommended that he resume Lasix 20 mg daily. He will have follow-up labs in the perioperative setting as his surgery is scheduled for 2 days from now.  4. Disposition: We will be available for consultation if necessary while patient is hospitalized following his hip surgery. Otherwise follow-up with Dr. Debara Pickett in 6 months or sooner if necessary.   Murray Hodgkins, NP 03/24/2016, 5:15 PM

## 2016-03-25 NOTE — Telephone Encounter (Signed)
Note routed via EPIC to Dr. Lyla Glassing

## 2016-03-25 NOTE — Telephone Encounter (Signed)
Patient cleared per Angelica Ran, NP note.

## 2016-03-26 ENCOUNTER — Inpatient Hospital Stay (HOSPITAL_COMMUNITY): Payer: Commercial Managed Care - HMO

## 2016-03-26 ENCOUNTER — Inpatient Hospital Stay (HOSPITAL_COMMUNITY)
Admission: RE | Admit: 2016-03-26 | Discharge: 2016-03-28 | DRG: 470 | Disposition: A | Discharge status: Home Health | Payer: Commercial Managed Care - HMO | Source: Ambulatory Visit | Attending: Orthopedic Surgery | Admitting: Orthopedic Surgery

## 2016-03-26 ENCOUNTER — Encounter (HOSPITAL_COMMUNITY)
Admission: RE | Disposition: A | Discharge status: Home Health | Payer: Self-pay | Source: Ambulatory Visit | Attending: Orthopedic Surgery

## 2016-03-26 ENCOUNTER — Inpatient Hospital Stay (HOSPITAL_COMMUNITY): Payer: Commercial Managed Care - HMO | Admitting: Registered Nurse

## 2016-03-26 ENCOUNTER — Encounter (HOSPITAL_COMMUNITY): Payer: Self-pay | Admitting: *Deleted

## 2016-03-26 DIAGNOSIS — M1611 Unilateral primary osteoarthritis, right hip: Secondary | ICD-10-CM | POA: Diagnosis not present

## 2016-03-26 DIAGNOSIS — M549 Dorsalgia, unspecified: Secondary | ICD-10-CM | POA: Diagnosis present

## 2016-03-26 DIAGNOSIS — K219 Gastro-esophageal reflux disease without esophagitis: Secondary | ICD-10-CM | POA: Diagnosis not present

## 2016-03-26 DIAGNOSIS — M1612 Unilateral primary osteoarthritis, left hip: Secondary | ICD-10-CM | POA: Diagnosis present

## 2016-03-26 DIAGNOSIS — L03116 Cellulitis of left lower limb: Secondary | ICD-10-CM | POA: Diagnosis not present

## 2016-03-26 DIAGNOSIS — Z87891 Personal history of nicotine dependence: Secondary | ICD-10-CM | POA: Diagnosis not present

## 2016-03-26 DIAGNOSIS — Z96641 Presence of right artificial hip joint: Secondary | ICD-10-CM | POA: Diagnosis not present

## 2016-03-26 DIAGNOSIS — M25551 Pain in right hip: Secondary | ICD-10-CM | POA: Diagnosis present

## 2016-03-26 DIAGNOSIS — Z6836 Body mass index (BMI) 36.0-36.9, adult: Secondary | ICD-10-CM

## 2016-03-26 DIAGNOSIS — D62 Acute posthemorrhagic anemia: Secondary | ICD-10-CM | POA: Diagnosis not present

## 2016-03-26 DIAGNOSIS — Z09 Encounter for follow-up examination after completed treatment for conditions other than malignant neoplasm: Secondary | ICD-10-CM

## 2016-03-26 DIAGNOSIS — G8929 Other chronic pain: Secondary | ICD-10-CM | POA: Diagnosis present

## 2016-03-26 DIAGNOSIS — Z419 Encounter for procedure for purposes other than remedying health state, unspecified: Secondary | ICD-10-CM

## 2016-03-26 DIAGNOSIS — I350 Nonrheumatic aortic (valve) stenosis: Secondary | ICD-10-CM | POA: Diagnosis not present

## 2016-03-26 DIAGNOSIS — Z79899 Other long term (current) drug therapy: Secondary | ICD-10-CM | POA: Diagnosis not present

## 2016-03-26 DIAGNOSIS — Z471 Aftercare following joint replacement surgery: Secondary | ICD-10-CM | POA: Diagnosis not present

## 2016-03-26 DIAGNOSIS — I10 Essential (primary) hypertension: Secondary | ICD-10-CM | POA: Diagnosis present

## 2016-03-26 DIAGNOSIS — G4730 Sleep apnea, unspecified: Secondary | ICD-10-CM | POA: Diagnosis not present

## 2016-03-26 DIAGNOSIS — R269 Unspecified abnormalities of gait and mobility: Secondary | ICD-10-CM | POA: Diagnosis not present

## 2016-03-26 HISTORY — PX: TOTAL HIP ARTHROPLASTY: SHX124

## 2016-03-26 LAB — TYPE AND SCREEN
ABO/RH(D): O POS
ANTIBODY SCREEN: NEGATIVE

## 2016-03-26 SURGERY — ARTHROPLASTY, HIP, TOTAL, ANTERIOR APPROACH
Anesthesia: General | Site: Hip | Laterality: Right

## 2016-03-26 MED ORDER — CEFAZOLIN SODIUM-DEXTROSE 2-4 GM/100ML-% IV SOLN
2.0000 g | Freq: Four times a day (QID) | INTRAVENOUS | Status: AC
  Administered 2016-03-26 (×2): 2 g via INTRAVENOUS
  Filled 2016-03-26 (×2): qty 100

## 2016-03-26 MED ORDER — SENNA 8.6 MG PO TABS
2.0000 | ORAL_TABLET | Freq: Every day | ORAL | Status: DC
  Administered 2016-03-26 – 2016-03-27 (×2): 17.2 mg via ORAL
  Filled 2016-03-26 (×2): qty 2

## 2016-03-26 MED ORDER — SUCCINYLCHOLINE CHLORIDE 20 MG/ML IJ SOLN
INTRAMUSCULAR | Status: DC | PRN
  Administered 2016-03-26: 100 mg via INTRAVENOUS

## 2016-03-26 MED ORDER — ALLOPURINOL 300 MG PO TABS
300.0000 mg | ORAL_TABLET | Freq: Every day | ORAL | Status: DC
  Administered 2016-03-26 – 2016-03-28 (×3): 300 mg via ORAL
  Filled 2016-03-26 (×3): qty 1

## 2016-03-26 MED ORDER — HYDROMORPHONE HCL 2 MG/ML IJ SOLN
INTRAMUSCULAR | Status: AC
  Filled 2016-03-26: qty 1

## 2016-03-26 MED ORDER — FENTANYL CITRATE (PF) 100 MCG/2ML IJ SOLN
INTRAMUSCULAR | Status: AC
  Filled 2016-03-26: qty 2

## 2016-03-26 MED ORDER — ONDANSETRON HCL 4 MG/2ML IJ SOLN: 4.0000 mg | Freq: Four times a day (QID) | INTRAMUSCULAR | Status: DC | PRN

## 2016-03-26 MED ORDER — LACTATED RINGERS IV SOLN
INTRAVENOUS | Status: DC | PRN
  Administered 2016-03-26: 12:00:00 via INTRAVENOUS

## 2016-03-26 MED ORDER — SODIUM CHLORIDE 0.9 % IJ SOLN
INTRAMUSCULAR | Status: DC | PRN
  Administered 2016-03-26: 30 mL

## 2016-03-26 MED ORDER — SODIUM CHLORIDE 0.9 % IJ SOLN
INTRAMUSCULAR | Status: AC
  Filled 2016-03-26: qty 50

## 2016-03-26 MED ORDER — ISOPROPYL ALCOHOL 70 % SOLN
Status: AC
  Filled 2016-03-26: qty 480

## 2016-03-26 MED ORDER — SODIUM CHLORIDE 0.9 % IR SOLN
Status: DC | PRN
  Administered 2016-03-26: 1000 mL

## 2016-03-26 MED ORDER — PANTOPRAZOLE SODIUM 40 MG PO TBEC
80.0000 mg | DELAYED_RELEASE_TABLET | Freq: Every day | ORAL | Status: DC
  Administered 2016-03-27 – 2016-03-28 (×2): 80 mg via ORAL
  Filled 2016-03-26 (×2): qty 2

## 2016-03-26 MED ORDER — CITALOPRAM HYDROBROMIDE 20 MG PO TABS
40.0000 mg | ORAL_TABLET | Freq: Every day | ORAL | Status: DC
  Administered 2016-03-27 – 2016-03-28 (×2): 40 mg via ORAL
  Filled 2016-03-26 (×2): qty 2

## 2016-03-26 MED ORDER — ACETAMINOPHEN 650 MG RE SUPP: 650.0000 mg | Freq: Four times a day (QID) | RECTAL | Status: DC | PRN

## 2016-03-26 MED ORDER — DEXAMETHASONE SODIUM PHOSPHATE 10 MG/ML IJ SOLN
INTRAMUSCULAR | Status: DC | PRN
  Administered 2016-03-26: 10 mg via INTRAVENOUS

## 2016-03-26 MED ORDER — METOCLOPRAMIDE HCL 5 MG PO TABS: 5.0000 mg | ORAL_TABLET | Freq: Three times a day (TID) | ORAL | Status: DC | PRN

## 2016-03-26 MED ORDER — SUGAMMADEX SODIUM 200 MG/2ML IV SOLN
INTRAVENOUS | Status: AC
  Filled 2016-03-26: qty 2

## 2016-03-26 MED ORDER — HYDROGEN PEROXIDE 3 % EX SOLN
CUTANEOUS | Status: AC
  Filled 2016-03-26: qty 473

## 2016-03-26 MED ORDER — LACTATED RINGERS IV SOLN: INTRAVENOUS | Status: DC

## 2016-03-26 MED ORDER — ACETAMINOPHEN 325 MG PO TABS: 650.0000 mg | ORAL_TABLET | Freq: Four times a day (QID) | ORAL | Status: DC | PRN

## 2016-03-26 MED ORDER — METOCLOPRAMIDE HCL 5 MG/ML IJ SOLN: 5.0000 mg | Freq: Three times a day (TID) | INTRAMUSCULAR | Status: DC | PRN

## 2016-03-26 MED ORDER — MIDAZOLAM HCL 5 MG/5ML IJ SOLN
INTRAMUSCULAR | Status: DC | PRN
  Administered 2016-03-26: 1 mg via INTRAVENOUS

## 2016-03-26 MED ORDER — MAGNESIUM CITRATE PO SOLN: 1.0000 | Freq: Once | ORAL | Status: DC | PRN

## 2016-03-26 MED ORDER — KETOROLAC TROMETHAMINE 30 MG/ML IJ SOLN
INTRAMUSCULAR | Status: AC
  Filled 2016-03-26: qty 1

## 2016-03-26 MED ORDER — TRANEXAMIC ACID 1000 MG/10ML IV SOLN
1000.0000 mg | Freq: Once | INTRAVENOUS | Status: AC
  Administered 2016-03-26: 1000 mg via INTRAVENOUS
  Filled 2016-03-26: qty 10

## 2016-03-26 MED ORDER — ONDANSETRON HCL 4 MG/2ML IJ SOLN: 4.0000 mg | Freq: Once | INTRAMUSCULAR | Status: DC | PRN

## 2016-03-26 MED ORDER — BUPIVACAINE-EPINEPHRINE 0.25% -1:200000 IJ SOLN
INTRAMUSCULAR | Status: DC | PRN
  Administered 2016-03-26: 30 mL

## 2016-03-26 MED ORDER — FUROSEMIDE 20 MG PO TABS
20.0000 mg | ORAL_TABLET | Freq: Every day | ORAL | Status: DC
  Administered 2016-03-26 – 2016-03-28 (×3): 20 mg via ORAL
  Filled 2016-03-26 (×4): qty 1

## 2016-03-26 MED ORDER — KETOROLAC TROMETHAMINE 30 MG/ML IJ SOLN
INTRAMUSCULAR | Status: DC | PRN
  Administered 2016-03-26: 30 mg

## 2016-03-26 MED ORDER — ONDANSETRON HCL 4 MG PO TABS: 4.0000 mg | ORAL_TABLET | Freq: Four times a day (QID) | ORAL | Status: DC | PRN

## 2016-03-26 MED ORDER — PROPOFOL 10 MG/ML IV BOLUS
INTRAVENOUS | Status: DC | PRN
  Administered 2016-03-26: 140 mg via INTRAVENOUS

## 2016-03-26 MED ORDER — BUPIVACAINE-EPINEPHRINE 0.25% -1:200000 IJ SOLN
INTRAMUSCULAR | Status: AC
  Filled 2016-03-26: qty 1

## 2016-03-26 MED ORDER — POLYETHYLENE GLYCOL 3350 17 G PO PACK: 17.0000 g | PACK | Freq: Every day | ORAL | Status: DC | PRN

## 2016-03-26 MED ORDER — CEFAZOLIN SODIUM-DEXTROSE 2-4 GM/100ML-% IV SOLN
INTRAVENOUS | Status: AC
  Filled 2016-03-26: qty 100

## 2016-03-26 MED ORDER — TRANEXAMIC ACID 1000 MG/10ML IV SOLN
1000.0000 mg | INTRAVENOUS | Status: AC
  Administered 2016-03-26: 1000 mg via INTRAVENOUS
  Filled 2016-03-26: qty 10

## 2016-03-26 MED ORDER — HYDROGEN PEROXIDE 3 % EX SOLN
CUTANEOUS | Status: DC | PRN
  Administered 2016-03-26: 1

## 2016-03-26 MED ORDER — HYDROMORPHONE HCL 1 MG/ML IJ SOLN
INTRAMUSCULAR | Status: DC | PRN
  Administered 2016-03-26 (×4): 0.5 mg via INTRAVENOUS

## 2016-03-26 MED ORDER — CEFAZOLIN SODIUM-DEXTROSE 2-4 GM/100ML-% IV SOLN
2.0000 g | INTRAVENOUS | Status: AC
  Administered 2016-03-26: 2 g via INTRAVENOUS

## 2016-03-26 MED ORDER — SORBITOL 70 % SOLN
30.0000 mL | Freq: Every day | Status: DC | PRN
  Filled 2016-03-26: qty 30

## 2016-03-26 MED ORDER — DOCUSATE SODIUM 100 MG PO CAPS
100.0000 mg | ORAL_CAPSULE | Freq: Two times a day (BID) | ORAL | Status: DC
  Administered 2016-03-26 – 2016-03-28 (×4): 100 mg via ORAL
  Filled 2016-03-26 (×4): qty 1

## 2016-03-26 MED ORDER — AMLODIPINE BESYLATE 10 MG PO TABS
10.0000 mg | ORAL_TABLET | Freq: Every day | ORAL | Status: DC
  Administered 2016-03-28: 10 mg via ORAL
  Filled 2016-03-26 (×2): qty 1

## 2016-03-26 MED ORDER — ONDANSETRON HCL 4 MG/2ML IJ SOLN
INTRAMUSCULAR | Status: DC | PRN
  Administered 2016-03-26: 4 mg via INTRAVENOUS

## 2016-03-26 MED ORDER — KETOROLAC TROMETHAMINE 15 MG/ML IJ SOLN
7.5000 mg | Freq: Four times a day (QID) | INTRAMUSCULAR | Status: AC
  Administered 2016-03-26 – 2016-03-27 (×4): 7.5 mg via INTRAVENOUS
  Filled 2016-03-26 (×4): qty 1

## 2016-03-26 MED ORDER — METHOCARBAMOL 500 MG PO TABS
500.0000 mg | ORAL_TABLET | Freq: Four times a day (QID) | ORAL | Status: DC | PRN
  Administered 2016-03-27 (×2): 500 mg via ORAL
  Filled 2016-03-26 (×2): qty 1

## 2016-03-26 MED ORDER — CHLORHEXIDINE GLUCONATE 4 % EX LIQD: 60.0000 mL | Freq: Once | CUTANEOUS | Status: DC

## 2016-03-26 MED ORDER — ACETAMINOPHEN 10 MG/ML IV SOLN
1000.0000 mg | INTRAVENOUS | Status: AC
  Administered 2016-03-26: 1000 mg via INTRAVENOUS

## 2016-03-26 MED ORDER — WATER FOR IRRIGATION, STERILE IR SOLN
Status: DC | PRN
  Administered 2016-03-26: 1000 mL

## 2016-03-26 MED ORDER — DEXAMETHASONE SODIUM PHOSPHATE 10 MG/ML IJ SOLN
10.0000 mg | Freq: Once | INTRAMUSCULAR | Status: AC
Start: 2016-03-27 — End: 2016-03-27
  Administered 2016-03-27: 10 mg via INTRAVENOUS
  Filled 2016-03-26: qty 1

## 2016-03-26 MED ORDER — LIDOCAINE HCL (CARDIAC) 20 MG/ML IV SOLN
INTRAVENOUS | Status: DC | PRN
  Administered 2016-03-26: 100 mg via INTRAVENOUS

## 2016-03-26 MED ORDER — LIDOCAINE HCL (CARDIAC) 20 MG/ML IV SOLN
INTRAVENOUS | Status: AC
Start: 2016-03-26 — End: 2016-03-26
  Filled 2016-03-26: qty 5

## 2016-03-26 MED ORDER — 0.9 % SODIUM CHLORIDE (POUR BTL) OPTIME
TOPICAL | Status: DC | PRN
  Administered 2016-03-26: 1000 mL

## 2016-03-26 MED ORDER — DIPHENHYDRAMINE HCL 12.5 MG/5ML PO ELIX: 12.5000 mg | ORAL_SOLUTION | ORAL | Status: DC | PRN

## 2016-03-26 MED ORDER — METHOCARBAMOL 1000 MG/10ML IJ SOLN
500.0000 mg | Freq: Four times a day (QID) | INTRAVENOUS | Status: DC | PRN
  Administered 2016-03-26: 500 mg via INTRAVENOUS
  Filled 2016-03-26: qty 5
  Filled 2016-03-26: qty 550

## 2016-03-26 MED ORDER — SUGAMMADEX SODIUM 200 MG/2ML IV SOLN
INTRAVENOUS | Status: DC | PRN
  Administered 2016-03-26: 200 mg via INTRAVENOUS

## 2016-03-26 MED ORDER — PROPOFOL 10 MG/ML IV BOLUS
INTRAVENOUS | Status: AC
  Filled 2016-03-26: qty 20

## 2016-03-26 MED ORDER — LABETALOL HCL 300 MG PO TABS
300.0000 mg | ORAL_TABLET | Freq: Three times a day (TID) | ORAL | Status: DC
  Administered 2016-03-26 – 2016-03-28 (×6): 300 mg via ORAL
  Filled 2016-03-26 (×7): qty 1

## 2016-03-26 MED ORDER — MIDAZOLAM HCL 2 MG/2ML IJ SOLN
INTRAMUSCULAR | Status: AC
  Filled 2016-03-26: qty 2

## 2016-03-26 MED ORDER — ISOPROPYL ALCOHOL 70 % SOLN
Status: DC | PRN
  Administered 2016-03-26: 1 via TOPICAL

## 2016-03-26 MED ORDER — ONDANSETRON HCL 4 MG/2ML IJ SOLN
INTRAMUSCULAR | Status: AC
  Filled 2016-03-26: qty 2

## 2016-03-26 MED ORDER — ROCURONIUM BROMIDE 100 MG/10ML IV SOLN
INTRAVENOUS | Status: DC | PRN
  Administered 2016-03-26: 10 mg via INTRAVENOUS
  Administered 2016-03-26: 5 mg via INTRAVENOUS
  Administered 2016-03-26: 20 mg via INTRAVENOUS
  Administered 2016-03-26: 10 mg via INTRAVENOUS

## 2016-03-26 MED ORDER — PHENOL 1.4 % MT LIQD
1.0000 | OROMUCOSAL | Status: DC | PRN
Start: 2016-03-26 — End: 2016-03-28
  Filled 2016-03-26: qty 177

## 2016-03-26 MED ORDER — ASPIRIN EC 325 MG PO TBEC
325.0000 mg | DELAYED_RELEASE_TABLET | Freq: Two times a day (BID) | ORAL | Status: DC
  Administered 2016-03-27 – 2016-03-28 (×3): 325 mg via ORAL
  Filled 2016-03-26 (×3): qty 1

## 2016-03-26 MED ORDER — FENTANYL CITRATE (PF) 100 MCG/2ML IJ SOLN
INTRAMUSCULAR | Status: DC | PRN
Start: 2016-03-26 — End: 2016-03-26
  Administered 2016-03-26: 50 ug via INTRAVENOUS
  Administered 2016-03-26: 100 ug via INTRAVENOUS
  Administered 2016-03-26: 50 ug via INTRAVENOUS

## 2016-03-26 MED ORDER — FENTANYL CITRATE (PF) 100 MCG/2ML IJ SOLN
25.0000 ug | INTRAMUSCULAR | Status: DC | PRN
  Administered 2016-03-26 (×2): 50 ug via INTRAVENOUS

## 2016-03-26 MED ORDER — HYDROCODONE-ACETAMINOPHEN 5-325 MG PO TABS
1.0000 | ORAL_TABLET | ORAL | Status: DC | PRN
  Administered 2016-03-26 – 2016-03-28 (×7): 2 via ORAL
  Filled 2016-03-26 (×7): qty 2

## 2016-03-26 MED ORDER — SODIUM CHLORIDE 0.9 % IV SOLN: INTRAVENOUS | Status: DC

## 2016-03-26 MED ORDER — DEXAMETHASONE SODIUM PHOSPHATE 10 MG/ML IJ SOLN
INTRAMUSCULAR | Status: AC
  Filled 2016-03-26: qty 1

## 2016-03-26 MED ORDER — TRAZODONE HCL 50 MG PO TABS
150.0000 mg | ORAL_TABLET | Freq: Every day | ORAL | Status: DC
  Administered 2016-03-26 – 2016-03-27 (×2): 150 mg via ORAL
  Filled 2016-03-26 (×3): qty 3

## 2016-03-26 MED ORDER — ACETAMINOPHEN 10 MG/ML IV SOLN
INTRAVENOUS | Status: AC
  Filled 2016-03-26: qty 100

## 2016-03-26 MED ORDER — IRBESARTAN 150 MG PO TABS
300.0000 mg | ORAL_TABLET | Freq: Every day | ORAL | Status: DC
  Administered 2016-03-26 – 2016-03-28 (×2): 300 mg via ORAL
  Filled 2016-03-26 (×3): qty 2

## 2016-03-26 MED ORDER — MENTHOL 3 MG MT LOZG: 1.0000 | LOZENGE | OROMUCOSAL | Status: DC | PRN

## 2016-03-26 MED ORDER — HYDROMORPHONE HCL 1 MG/ML IJ SOLN: 0.5000 mg | INTRAMUSCULAR | Status: DC | PRN

## 2016-03-26 MED ORDER — POVIDONE-IODINE 10 % EX SWAB: 2.0000 "application " | Freq: Once | CUTANEOUS | Status: DC

## 2016-03-26 MED ORDER — SODIUM CHLORIDE 0.9 % IV SOLN
INTRAVENOUS | Status: DC
  Administered 2016-03-26 – 2016-03-28 (×2): via INTRAVENOUS

## 2016-03-26 SURGICAL SUPPLY — 49 items
BAG DECANTER FOR FLEXI CONT (MISCELLANEOUS) IMPLANT
BAG SPEC THK2 15X12 ZIP CLS (MISCELLANEOUS)
BAG ZIPLOCK 12X15 (MISCELLANEOUS) IMPLANT
CAPT HIP TOTAL 2 ×3 IMPLANT
CHLORAPREP W/TINT 26ML (MISCELLANEOUS) ×3 IMPLANT
CLOTH BEACON ORANGE TIMEOUT ST (SAFETY) ×3 IMPLANT
COVER PERINEAL POST (MISCELLANEOUS) ×3 IMPLANT
DECANTER SPIKE VIAL GLASS SM (MISCELLANEOUS) ×3 IMPLANT
DRAPE LG THREE QUARTER DISP (DRAPES) ×6 IMPLANT
DRAPE STERI IOBAN 125X83 (DRAPES) ×3 IMPLANT
DRAPE U-SHAPE 47X51 STRL (DRAPES) ×6 IMPLANT
DRSG AQUACEL AG ADV 3.5X10 (GAUZE/BANDAGES/DRESSINGS) ×3 IMPLANT
DRSG AQUACEL AG ADV 3.5X14 (GAUZE/BANDAGES/DRESSINGS) ×3 IMPLANT
ELECT REM PT RETURN 15FT ADLT (MISCELLANEOUS) ×3 IMPLANT
GAUZE SPONGE 4X4 12PLY STRL (GAUZE/BANDAGES/DRESSINGS) ×3 IMPLANT
GLOVE BIO SURGEON STRL SZ8.5 (GLOVE) ×6 IMPLANT
GLOVE BIOGEL M STRL SZ7.5 (GLOVE) ×3 IMPLANT
GLOVE BIOGEL PI IND STRL 7.0 (GLOVE) ×6 IMPLANT
GLOVE BIOGEL PI IND STRL 7.5 (GLOVE) ×2 IMPLANT
GLOVE BIOGEL PI IND STRL 8.5 (GLOVE) ×1 IMPLANT
GLOVE BIOGEL PI INDICATOR 7.0 (GLOVE) ×12
GLOVE BIOGEL PI INDICATOR 7.5 (GLOVE) ×4
GLOVE BIOGEL PI INDICATOR 8.5 (GLOVE) ×2
GOWN SPEC L3 XXLG W/TWL (GOWN DISPOSABLE) ×3 IMPLANT
GOWN STRL REUS W/ TWL XL LVL3 (GOWN DISPOSABLE) ×4 IMPLANT
GOWN STRL REUS W/TWL XL LVL3 (GOWN DISPOSABLE) ×12
HANDPIECE INTERPULSE COAX TIP (DISPOSABLE) ×3
HOLDER FOLEY CATH W/STRAP (MISCELLANEOUS) ×3 IMPLANT
HOOD PEEL AWAY FLYTE STAYCOOL (MISCELLANEOUS) ×6 IMPLANT
LIQUID BAND (GAUZE/BANDAGES/DRESSINGS) ×3 IMPLANT
MARKER SKIN DUAL TIP RULER LAB (MISCELLANEOUS) ×3 IMPLANT
NEEDLE SPNL 18GX3.5 QUINCKE PK (NEEDLE) ×3 IMPLANT
PACK ANTERIOR HIP CUSTOM (KITS) ×3 IMPLANT
SAW OSC TIP CART 19.5X105X1.3 (SAW) ×3 IMPLANT
SEALER BIPOLAR AQUA 6.0 (INSTRUMENTS) ×3 IMPLANT
SET HNDPC FAN SPRY TIP SCT (DISPOSABLE) ×1 IMPLANT
SOL PREP POV-IOD 4OZ 10% (MISCELLANEOUS) ×3 IMPLANT
SUT ETHIBOND NAB CT1 #1 30IN (SUTURE) ×6 IMPLANT
SUT MNCRL AB 3-0 PS2 18 (SUTURE) ×3 IMPLANT
SUT MON AB 2-0 CT1 36 (SUTURE) ×6 IMPLANT
SUT VIC AB 1 CT1 36 (SUTURE) ×3 IMPLANT
SUT VIC AB 2-0 CT1 27 (SUTURE) ×6
SUT VIC AB 2-0 CT1 TAPERPNT 27 (SUTURE) ×2 IMPLANT
SUT VLOC 180 0 24IN GS25 (SUTURE) ×3 IMPLANT
SYR 50ML LL SCALE MARK (SYRINGE) ×3 IMPLANT
TRAY FOLEY W/METER SILVER 14FR (SET/KITS/TRAYS/PACK) IMPLANT
TRAY FOLEY W/METER SILVER 16FR (SET/KITS/TRAYS/PACK) ×3 IMPLANT
WATER STERILE IRR 1500ML POUR (IV SOLUTION) ×3 IMPLANT
YANKAUER SUCT BULB TIP 10FT TU (MISCELLANEOUS) ×3 IMPLANT

## 2016-03-26 NOTE — Discharge Instructions (Signed)
°Dr. Jurnie Garritano °Joint Replacement Specialist °[facility] °[address] °[place], [place] [address] °[phone] ° ° °TOTAL HIP REPLACEMENT POSTOPERATIVE DIRECTIONS ° ° ° °Hip Rehabilitation, Guidelines Following Surgery  ° °WEIGHT BEARING °Weight bearing as tolerated with assist device (walker, cane, etc) as directed, use it as long as suggested by your surgeon or therapist, typically at least 4-6 weeks. ° °The results of a hip operation are greatly improved after range of motion and muscle strengthening exercises. Follow all safety measures which are given to protect your hip. If any of these exercises cause increased pain or swelling in your joint, decrease the amount until you are comfortable again. Then slowly increase the exercises. Call your caregiver if you have problems or questions.  ° °HOME CARE INSTRUCTIONS  °Most of the following instructions are designed to prevent the dislocation of your new hip.  °Remove items at home which could result in a fall. This includes throw rugs or furniture in walking pathways.  °Continue medications as instructed at time of discharge. °· You may have some home medications which will be placed on hold until you complete the course of blood thinner medication. °· You may start showering once you are discharged home. Do not remove your dressing. °Do not put on socks or shoes without following the instructions of your caregivers.   °Sit on chairs with arms. Use the chair arms to help push yourself up when arising.  °Arrange for the use of a toilet seat elevator so you are not sitting low.  °· Walk with walker as instructed.  °You may resume a sexual relationship in one month or when given the OK by your caregiver.  °Use walker as long as suggested by your caregivers.  °You may put full weight on your legs and walk as much as is comfortable. °Avoid periods of inactivity such as sitting longer than an hour when not asleep. This helps prevent  blood clots.  °You may return to work once you are cleared by your surgeon.  °Do not drive a car for 6 weeks or until released by your surgeon.  °Do not drive while taking narcotics.  °Wear elastic stockings for two weeks following surgery during the day but you may remove then at night.  °Make sure you keep all of your appointments after your operation with all of your doctors and caregivers. You should call the office at the above phone number and make an appointment for approximately two weeks after the date of your surgery. °Please pick up a stool softener and laxative for home use as long as you are requiring pain medications. °· ICE to the affected hip every three hours for 30 minutes at a time and then as needed for pain and swelling. Continue to use ice on the hip for pain and swelling from surgery. You may notice swelling that will progress down to the foot and ankle.  This is normal after surgery.  Elevate the leg when you are not up walking on it.   °It is important for you to complete the blood thinner medication as prescribed by your doctor. °· Continue to use the breathing machine which will help keep your temperature down.  It is common for your temperature to cycle up and down following surgery, especially at night when you are not up moving around and exerting yourself.  The breathing machine keeps your lungs expanded and your temperature down. ° °RANGE OF MOTION AND STRENGTHENING EXERCISES  °These exercises are   designed to help you keep full movement of your hip joint. Follow your caregiver's or physical therapist's instructions. Perform all exercises about fifteen times, three times per day or as directed. Exercise both hips, even if you have had only one joint replacement. These exercises can be done on a training (exercise) mat, on the floor, on a table or on a bed. Use whatever works the best and is most comfortable for you. Use music or television while you are exercising so that the exercises  are a pleasant break in your day. This will make your life better with the exercises acting as a break in routine you can look forward to.  °Lying on your back, slowly slide your foot toward your buttocks, raising your knee up off the floor. Then slowly slide your foot back down until your leg is straight again.  °Lying on your back spread your legs as far apart as you can without causing discomfort.  °Lying on your side, raise your upper leg and foot straight up from the floor as far as is comfortable. Slowly lower the leg and repeat.  °Lying on your back, tighten up the muscle in the front of your thigh (quadriceps muscles). You can do this by keeping your leg straight and trying to raise your heel off the floor. This helps strengthen the largest muscle supporting your knee.  °Lying on your back, tighten up the muscles of your buttocks both with the legs straight and with the knee bent at a comfortable angle while keeping your heel on the floor.  ° °SKILLED REHAB INSTRUCTIONS: °If the patient is transferred to a skilled rehab facility following release from the hospital, a list of the current medications will be sent to the facility for the patient to continue.  When discharged from the skilled rehab facility, please have the facility set up the patient's Home Health Physical Therapy prior to being released. Also, the skilled facility will be responsible for providing the patient with their medications at time of release from the facility to include their pain medication and their blood thinner medication. If the patient is still at the rehab facility at time of the two week follow up appointment, the skilled rehab facility will also need to assist the patient in arranging follow up appointment in our office and any transportation needs. ° °MAKE SURE YOU:  °Understand these instructions.  °Will watch your condition.  °Will get help right away if you are not doing well or get worse. ° °Pick up stool softner and  laxative for home use following surgery while on pain medications. °Do not remove your dressing. °The dressing is waterproof--it is OK to take showers. °Continue to use ice for pain and swelling after surgery. °Do not use any lotions or creams on the incision until instructed by your surgeon. °Total Hip Protocol. ° ° °

## 2016-03-26 NOTE — Anesthesia Preprocedure Evaluation (Addendum)
Anesthesia Evaluation  Patient identified by MRN, date of birth, ID band Patient awake    Reviewed: Allergy & Precautions, NPO status , Patient's Chart, lab work & pertinent test results, reviewed documented beta blocker date and time   Airway Mallampati: II  TM Distance: >3 FB Neck ROM: Full    Dental  (+) Dental Advisory Given, Poor Dentition, Chipped, Partial Upper   Pulmonary sleep apnea , former smoker,    Pulmonary exam normal breath sounds clear to auscultation       Cardiovascular hypertension, Pt. on medications and Pt. on home beta blockers + Valvular Problems/Murmurs (mild AS by TTE) AS  Rhythm:Regular Rate:Normal + Systolic murmurs [id number]: Study Conclusions  - Left ventricle: Wall thickness was increased in a pattern of mild LVH. Systolic function was normal. The estimated ejection fraction was in the range of 50% to 55%. Mild diffuse hypokinesis. Doppler parameters are consistent with abnormal left ventricular relaxation (grade 1 diastolic dysfunction). - Aortic valve: Moderately calcified annulus. Moderately calcified leaflets. Right coronary and noncoronary cusp mobility was restricted. There was very mild stenosis. Peak velocity (S): 186 cm/s. Mean gradient (S): 7 mm Hg. Valve area (VTI): 2.01 cm^2. Valve area (Vmean): 1.73 cm^2. - Aorta: Aortic root dimension: 37 mm (ED). Ascending aortic diameter: 38 mm (S). - Aortic root: The aortic root was mildly dilated. - Ascending aorta: The ascending aorta was mildly dilated. - Left atrium: The atrium was mildly dilated. - Right ventricle: The cavity size was normal. Wall thickness was normal. Systolic function was normal. - Atrial septum: No defect or patent foramen ovale was identified. - Tricuspid valve: There was mild regurgitation. - Pulmonic valve: There was trivial regurgitation. - Pulmonary arteries: PA peak pressure: 35 mm Hg (S).    Neuro/Psych PSYCHIATRIC  DISORDERS Anxiety Depression Peripheral neuropathy     GI/Hepatic negative GI ROS, Neg liver ROS, GERD  Medicated and Controlled,  Endo/Other  Obesity   Renal/GU negative Renal ROS     Musculoskeletal  (+) Arthritis , Osteoarthritis,    Abdominal   Peds  Hematology  (+) Blood dyscrasia, anemia , Plt 193k   Anesthesia Other Findings Day of surgery medications reviewed with the patient.  Reproductive/Obstetrics                         Anesthesia Physical Anesthesia Plan  ASA: II  Anesthesia Plan: General   Post-op Pain Management:    Induction: Intravenous  Airway Management Planned: Oral ETT  Additional Equipment:   Intra-op Plan:   Post-operative Plan: Extubation in OR  Informed Consent: I have reviewed the patients History and Physical, chart, labs and discussed the procedure including the risks, benefits and alternatives for the proposed anesthesia with the patient or authorized representative who has indicated his/her understanding and acceptance.   Dental advisory given  Plan Discussed with: CRNA, Anesthesiologist and Surgeon  Anesthesia Plan Comments: (Risks/benefits of general anesthesia discussed with patient including risk of damage to teeth, lips, gum, and tongue, nausea/vomiting, allergic reactions to medications, and the possibility of heart attack, stroke and death.  All patient questions answered.  Patient wishes to proceed.)       Anesthesia Quick Evaluation                                  Anesthesia Evaluation  Patient identified by MRN, date of birth, ID band Patient awake  Reviewed: Allergy & Precautions, NPO status , Patient's Chart, lab work & pertinent test results  History of Anesthesia Complications Negative for: history of anesthetic complications  Airway Mallampati: II  TM Distance: >3 FB Neck ROM: Full    Dental  (+) Poor Dentition, Chipped, Missing, Dental Advisory Given   Pulmonary sleep  apnea (does not use CPAP) , former smoker (quit 1970),    breath sounds clear to auscultation       Cardiovascular hypertension, Pt. on medications and Pt. on home beta blockers (-) angina Rhythm:Regular Rate:Normal  10/16 ECHO: low normal LV function - calcified aortic valve with very mild stenosis    Neuro/Psych Chronic back pain: narcotics    GI/Hepatic Neg liver ROS, GERD  Medicated and Controlled,  Endo/Other  Morbid obesity  Renal/GU negative Renal ROS     Musculoskeletal  (+) Arthritis ,   Abdominal (+) + obese,   Peds  Hematology negative hematology ROS (+)   Anesthesia Other Findings   Reproductive/Obstetrics                           Anesthesia Physical Anesthesia Plan  ASA: III  Anesthesia Plan: General   Post-op Pain Management:    Induction: Intravenous  Airway Management Planned: Oral ETT  Additional Equipment:   Intra-op Plan:   Post-operative Plan: Extubation in OR  Informed Consent: I have reviewed the patients History and Physical, chart, labs and discussed the procedure including the risks, benefits and alternatives for the proposed anesthesia with the patient or authorized representative who has indicated his/her understanding and acceptance.   Dental advisory given  Plan Discussed with: CRNA and Surgeon  Anesthesia Plan Comments: (Plan routine monitors, GETA)       Anesthesia Quick Evaluation                                   Anesthesia Evaluation  Patient identified by MRN, date of birth, ID band Patient awake    Reviewed: Allergy & Precautions, NPO status , Patient's Chart, lab work & pertinent test results  History of Anesthesia Complications Negative for: history of anesthetic complications  Airway Mallampati: II  TM Distance: >3 FB Neck ROM: Full    Dental  (+) Poor Dentition, Chipped, Missing, Dental Advisory Given   Pulmonary sleep apnea (does not use CPAP) , former smoker  (quit 1970),    breath sounds clear to auscultation       Cardiovascular hypertension, Pt. on medications and Pt. on home beta blockers (-) angina Rhythm:Regular Rate:Normal  10/16 ECHO: low normal LV function - calcified aortic valve with very mild stenosis    Neuro/Psych Chronic back pain: narcotics    GI/Hepatic Neg liver ROS, GERD  Medicated and Controlled,  Endo/Other  Morbid obesity  Renal/GU negative Renal ROS     Musculoskeletal  (+) Arthritis ,   Abdominal (+) + obese,   Peds  Hematology negative hematology ROS (+)   Anesthesia Other Findings   Reproductive/Obstetrics                           Anesthesia Physical Anesthesia Plan  ASA: III  Anesthesia Plan: General   Post-op Pain Management:    Induction: Intravenous  Airway Management Planned: Oral ETT  Additional Equipment:   Intra-op Plan:   Post-operative Plan: Extubation in OR  Informed  Consent: I have reviewed the patients History and Physical, chart, labs and discussed the procedure including the risks, benefits and alternatives for the proposed anesthesia with the patient or authorized representative who has indicated his/her understanding and acceptance.   Dental advisory given  Plan Discussed with: CRNA and Surgeon  Anesthesia Plan Comments: (Plan routine monitors, GETA)       Anesthesia Quick Evaluation

## 2016-03-26 NOTE — Discharge Summary (Signed)
Physician Discharge Summary  Patient ID: John Hanson MRN: [id number] DOB/AGE: [birth year] 74 y.o.  Admit date: 03/26/2016 Discharge date: 03/28/2016  Admission Diagnoses:  Primary osteoarthritis of right hip  Discharge Diagnoses:  Principal Problem:   Primary osteoarthritis of right hip   Past Medical History  Diagnosis Date  . Essential hypertension   . GERD (gastroesophageal reflux disease)   . Varicose veins   . Sleep apnea     a. pt states does not use CPAP machine   . Numbness and tingling     fingertips bilat   . Arthritis   . Anxiety   . Depression   . Aortic stenosis     a. 06/2015 Echo: EF 50-55%, mild diff HK, Gr1 DD, mild AS w/ restricted mobility of R coronary and non-coronary cusp, mildly dil Ao root (35mm), mildly dil LA, nl RV fxn, mild TR/PR, PASP 67mmHg.  . Osteoarthritis     a. R>L hip pending R THA.    Surgeries: Procedure(s): RIGHT TOTAL HIP ARTHROPLASTY ANTERIOR APPROACH on 03/26/2016   Consultants (if any):    Discharged Condition: Improved  Hospital Course: John Hanson is an 75 y.o. male who was admitted 03/26/2016 with a diagnosis of Primary osteoarthritis of right hip and went to the operating room on 03/26/2016 and underwent the above named procedures.    He was given perioperative antibiotics:      Anti-infectives    Start     Dose/Rate Route Frequency Ordered Stop   03/27/16 1000  cefadroxil (DURICEF) capsule 500 mg     500 mg Oral Every 12 hours 03/27/16 0727     03/27/16 0000  cefadroxil (DURICEF) 500 MG capsule     500 mg Oral Every 12 hours 03/27/16 0731     03/26/16 1800  ceFAZolin (ANCEF) IVPB 2g/100 mL premix     2 g 200 mL/hr over 30 Minutes Intravenous Every 6 hours 03/26/16 1659 03/27/16 0007   03/26/16 1043  ceFAZolin (ANCEF) IVPB 2g/100 mL premix     2 g 200 mL/hr over 30 Minutes Intravenous On call to O.R. 03/26/16 1043 03/26/16 1154    .  He was given sequential compression devices, early ambulation, and  ASA for DVT prophylaxis.  He benefited maximally from the hospital stay and there were no complications.    Recent vital signs:  Filed Vitals:   03/27/16 2150 03/28/16 0634  BP: 146/77 172/89  Pulse: 81 84  Temp: 97.6 F (36.4 C) 97.5 F (36.4 C)  Resp: 16 16    Recent laboratory studies:  Lab Results  Component Value Date   HGB 8.4* 03/28/2016   HGB 9.1* 03/27/2016   HGB 12.3* 03/18/2016   Lab Results  Component Value Date   WBC 7.0 03/28/2016   PLT 173 03/28/2016   No results found for: INR Lab Results  Component Value Date   NA 135 03/28/2016   K 4.2 03/28/2016   CL 106 03/28/2016   CO2 26 03/28/2016   BUN 22* 03/28/2016   CREATININE 0.97 03/28/2016   GLUCOSE 131* 03/28/2016    Discharge Medications:     Medication List    STOP taking these medications        naproxen sodium 220 MG tablet  Commonly known as:  ANAPROX      TAKE these medications        allopurinol 300 MG tablet  Commonly known as:  ZYLOPRIM  Take 300 mg by mouth daily.  amLODipine 10 MG tablet  Commonly known as:  NORVASC  Take 10 mg by mouth daily.     aspirin EC 325 MG tablet  Take 1 tablet (325 mg total) by mouth 2 (two) times daily after a meal.     cefadroxil 500 MG capsule  Commonly known as:  DURICEF  Take 1 capsule (500 mg total) by mouth every 12 (twelve) hours.     citalopram 40 MG tablet  Commonly known as:  CELEXA  Take 40 mg by mouth daily.     docusate sodium 100 MG capsule  Commonly known as:  COLACE  Take 1 capsule (100 mg total) by mouth 2 (two) times daily.     furosemide 20 MG tablet  Commonly known as:  LASIX  Take 1 tablet (20 mg total) by mouth daily.     HYDROcodone-acetaminophen 5-325 MG tablet  Commonly known as:  NORCO  Take 1-2 tablets by mouth every 4 (four) hours as needed for moderate pain.     labetalol 300 MG tablet  Commonly known as:  NORMODYNE  Take 300 mg by mouth 3 (three) times daily.     omeprazole 40 MG capsule   Commonly known as:  PRILOSEC  Take 40 mg by mouth daily.     ondansetron 4 MG tablet  Commonly known as:  ZOFRAN  Take 1 tablet (4 mg total) by mouth every 6 (six) hours as needed for nausea.     senna 8.6 MG Tabs tablet  Commonly known as:  SENOKOT  Take 2 tablets (17.2 mg total) by mouth at bedtime.     traZODone 150 MG tablet  Commonly known as:  DESYREL  Take 150 mg by mouth at bedtime.     valsartan 320 MG tablet  Commonly known as:  DIOVAN  Take 320 mg by mouth daily.        Diagnostic Studies: Dg Pelvis Portable  03/26/2016  CLINICAL DATA:  Status post right total hip replacement EXAM: PORTABLE PELVIS 1-2 VIEWS COMPARISON:  Intraoperative evaluation performed earlier in the day FINDINGS: Frontal view shows a total hip replacement on the right with the prosthetic components appearing well-seated on this single view. There is a large subchondral cyst along the lateral superior acetabulum on the right. There is severe osteoarthritic change in the left hip joint. No acute fracture or dislocation. Air on the right is an expected postoperative finding. There are scattered foci of arterial vascular calcification. IMPRESSION: Status post total hip replacement on the right with prosthetic components appearing well-seated. Large apparent subchondral cyst superior right acetabulum laterally. Extensive osteoarthritic change left hip joint. No acute fracture or dislocation. Arterial vascular calcification consistent with atherosclerosis. Electronically Signed   By: Lowella Grip III M.D.   On: 03/26/2016 15:53   Dg C-arm 1-60 Min-no Report  03/26/2016  CLINICAL DATA: surgery C-ARM 1-60 MINUTES Fluoroscopy was utilized by the requesting physician.  No radiographic interpretation.   Dg Hip Operative Unilat With Pelvis Right  03/26/2016  CLINICAL DATA:  Right hip arthroplasty. EXAM: OPERATIVE RIGHT HIP (WITH PELVIS IF PERFORMED) INTRAOPERATIVE VIEWS TECHNIQUE: Fluoroscopic spot image(s)  were submitted for interpretation post-operatively. COMPARISON:  None. FINDINGS: Two fluoroscopic intraoperative images from anterior right hip arthroplasty demonstrate placement of 3 component right hip prosthesis. The orthopedic hardware is in anatomic alignment. There is no evidence fracture. No immediate complications are seen. IMPRESSION: Intraoperative images from anterior right hip arthroplasty without evidence of immediate complications. Electronically Signed   By: Fidela Salisbury  M.D.   On: 03/26/2016 14:42    Disposition: 01-Home or Self Care    Follow-up Information    Follow up with Scarlette Hogston, Horald Pollen, MD. Schedule an appointment as soon as possible for a visit in 2 weeks.   Specialty:  Orthopedic Surgery   Why:  For wound re-check   Contact information:   [address] [address] [phone]       Follow up with [facility].   Why:  home health physical therapy   Contact information:   [address] [place] [address] [phone]        Signed: Elie Goody 03/28/2016, 3:16 PM

## 2016-03-26 NOTE — Progress Notes (Signed)
Pt arrived unit from PACU, alert and oriented, family at bed side. Will continue with current plan of care,.

## 2016-03-26 NOTE — Interval H&P Note (Signed)
History and Physical Interval Note:  03/26/2016 11:30 AM  John Hanson  has presented today for surgery, with the diagnosis of DJD RIGHT HIP   The various methods of treatment have been discussed with the patient and family. After consideration of risks, benefits and other options for treatment, the patient has consented to  Procedure(s): RIGHT TOTAL HIP ARTHROPLASTY ANTERIOR APPROACH (Right) as a surgical intervention .  The patient's history has been reviewed, patient examined, no change in status, stable for surgery.  I have reviewed the patient's chart and labs.  Questions were answered to the patient's satisfaction.     Kaysin Brock, Horald Pollen

## 2016-03-26 NOTE — Op Note (Signed)
OPERATIVE REPORT  SURGEON: Rod Can, MD   ASSISTANT: Nehemiah Massed, PA-C.  PREOPERATIVE DIAGNOSIS: Right hip arthritis.   POSTOPERATIVE DIAGNOSIS: Right hip arthritis.   PROCEDURE: Right total hip arthroplasty, anterior approach.   IMPLANTS: DePuy Tri Lock stem, size 7, std offset. DePuy Pinnacle Cup, size 56 mm. DePuy Altrx liner, size 36 by 56 mm, +4 neutral. DePuy Biolox ceramic head ball, size 36 + 1.5 mm.  ANESTHESIA:  General  ESTIMATED BLOOD LOSS: 400 mL.  ANTIBIOTICS: 2 g Ancef.  DRAINS: None.  COMPLICATIONS: None.   CONDITION: PACU - hemodynamically stable.Marland Kitchen   BRIEF CLINICAL NOTE: John Hanson is a 75 y.o. male with a long-standing history of Right hip arthritis. After failing conservative management, the patient was indicated for total hip arthroplasty. The risks, benefits, and alternatives to the procedure were explained, and the patient elected to proceed.  PROCEDURE IN DETAIL: Surgical site was marked by myself. Spinal anesthesia was obtained in the pre-op holding area. Once inside the operative room, a foley catheter was inserted. The patient was then positioned on the Hana table. All bony prominences were well padded. The hip was prepped and draped in the normal sterile surgical fashion. A time-out was called verifying side and site of surgery. The patient received IV antibiotics within 60 minutes of beginning the procedure.  The direct anterior approach to the hip was performed through the Hueter interval. Lateral femoral circumflex vessels were treated with the Auqumantys. The anterior capsule was exposed and an inverted T capsulotomy was made.The femoral neck cut was made to the level of the templated cut. A corkscrew was placed into the head and the head was removed. The femoral head was found to have eburnated bone. The head was passed to the back table and was measured.  Acetabular exposure was achieved, and the pulvinar and labrum  were excised. Sequental reaming of the acetabulum was then performed up to a size 55 mm reamer. A 56 mm cup was then opened and impacted into place at approximately 40 degrees of abduction and 20 degrees of anteversion. The final polyethylene liner was impacted into place and acetabular osteophytes were removed.   I then gained femoral exposure taking care to protect the abductors and greater trochanter. This was performed using standard external rotation, extension, and adduction. The capsule was peeled off the inner aspect of the greater trochanter, taking care to preserve the short external rotators. A cookie cutter was used to enter the femoral canal, and then the femoral canal finder was placed. Sequential broaching was performed up to a size 7. Calcar planer was used on the femoral neck remnant. I placed a std offset neck and a trial head ball. The hip was reduced. Leg lengths and offset were checked fluoroscopically. The hip was dislocated and trial components were removed. The final implants were placed, and the hip was reduced.  Fluoroscopy was used to confirm component position and leg lengths. At 90 degrees of external rotation and full extension, the hip was stable to an anterior directed force.  The wound was copiously irrigated with a dilute betadine solution followed by normal saline. Marcaine solution was injected into the periarticular soft tissue. The wound was closed in layers using #1 Vicryl and V-Loc for the fascia, 2-0 Vicryl for the subcutaneous fat, 2-0 Monocryl for the deep dermal layer, 3-0 running Monocryl subcuticular stitch, and Dermabond for the skin. Once the glue was fully dried, an Aquacell Ag dressing was applied. The patient was transported to the  recovery room in stable condition. Sponge, needle, and instrument counts were correct at the end of the case x2. The patient tolerated the procedure well and there were no known complications.  Please note that a  surgical assistant was a medical necessity for this procedure to perform it in a safe and expeditious manner. Assistant was necessary to provide appropriate retraction of vital neurovascular structures, to prevent femoral fracture, and to allow for anatomic placement of the prosthesis.

## 2016-03-26 NOTE — H&P (View-Only) (Signed)
TOTAL HIP ADMISSION H&P  Patient is admitted for right total hip arthroplasty.  Subjective:  Chief Complaint: right hip pain  HPI: John Hanson, 75 y.o. male, has a history of pain and functional disability in the right hip(s) due to arthritis and patient has failed non-surgical conservative treatments for greater than 12 weeks to include NSAID's and/or analgesics, corticosteriod injections, flexibility and strengthening excercises, use of assistive devices, weight reduction as appropriate and activity modification.  Onset of symptoms was gradual starting 1 years ago with gradually worsening course since that time.The patient noted no past surgery on the right hip(s).  Patient currently rates pain in the right hip at 10 out of 10 with activity. Patient has night pain, worsening of pain with activity and weight bearing, trendelenberg gait, pain that interfers with activities of daily living, pain with passive range of motion and crepitus. Patient has evidence of subchondral cysts, subchondral sclerosis, periarticular osteophytes and joint space narrowing by imaging studies. This condition presents safety issues increasing the risk of falls. There is no current active infection.  Patient Active Problem List   Diagnosis Date Noted  . Spinal stenosis of lumbar region 09/04/2015  . HTN (hypertension) 07/18/2015  . Murmur 07/18/2015  . Preoperative cardiovascular examination 07/18/2015  . Varicose veins of lower extremities with other complications [id number]  . Pain in limb 02/08/2014   Past Medical History  Diagnosis Date  . Hypertension   . GERD (gastroesophageal reflux disease)   . Varicose veins   . Sleep apnea     pt states does not use CPAP machine   . Numbness and tingling     fingertips bilat   . Shortness of breath dyspnea     walking distances or climbing stairs  . Arthritis     Past Surgical History  Procedure Laterality Date  . Endovenous ablation saphenous vein w/  laser Right 05-31-2014    EVLA  right gretaer saphenous vein by Curt Jews MD  . Lumbar laminectomy/decompression microdiscectomy N/A 09/04/2015    Procedure: MICRO LUMBAR DECOMPRESSION L4 - L5,  L3 - L4 2 LEVELS;  Surgeon: Susa Day, MD;  Location: [facility];  Service: Orthopedics;  Laterality: N/A;     (Not in a hospital admission) No Known Allergies  Social History  Substance Use Topics  . Smoking status: Former Smoker -- 5 years    Types: Cigars    Quit date: 09/28/1968  . Smokeless tobacco: Never Used  . Alcohol Use: No    Family History  Problem Relation Age of Onset  . Kidney disease Mother      Review of Systems  Constitutional: Negative.   HENT: Negative.   Eyes: Negative.   Respiratory: Negative.   Cardiovascular: Negative.   Gastrointestinal: Negative.   Genitourinary: Negative.   Musculoskeletal: Positive for back pain and joint pain.  Skin: Negative.   Neurological: Negative.   Endo/Heme/Allergies: Negative.   Psychiatric/Behavioral: Negative.     Objective:  Physical Exam  Vitals reviewed. Constitutional: He is oriented to person, place, and time. He appears well-developed and well-nourished.  HENT:  Head: Normocephalic and atraumatic.  Neck: Normal range of motion. Neck supple.  Cardiovascular: Normal rate, regular rhythm and normal heart sounds.   Respiratory: Effort normal and breath sounds normal.  GI: Soft. Bowel sounds are normal.  Genitourinary:  deferred  Musculoskeletal:       Right hip: He exhibits decreased range of motion.  Neurological: He is alert and oriented to person, place, and time.  He has normal reflexes.  Skin: Skin is warm and dry.  Psychiatric: He has a normal mood and affect. His behavior is normal. Judgment and thought content normal.    Vital signs in last 24 hours: @VSRANGES @  Labs:   Estimated body mass index is 36.74 kg/(m^2) as calculated from the following:   Height as of 09/04/15: 5' 8.5" (1.74 m).   Weight  as of 08/29/15: 111.245 kg (245 lb 4 oz).   Imaging Review Plain radiographs demonstrate severe degenerative joint disease of the bilateral hip(s). The bone quality appears to be adequate for age and reported activity level.  Assessment/Plan:  End stage arthritis, right hip(s)  The patient history, physical examination, clinical judgement of the provider and imaging studies are consistent with end stage degenerative joint disease of the right hip(s) and total hip arthroplasty is deemed medically necessary. The treatment options including medical management, injection therapy, arthroscopy and arthroplasty were discussed at length. The risks and benefits of total hip arthroplasty were presented and reviewed. The risks due to aseptic loosening, infection, stiffness, dislocation/subluxation,  thromboembolic complications and other imponderables were discussed.  The patient acknowledged the explanation, agreed to proceed with the plan and consent was signed. Patient is being admitted for inpatient treatment for surgery, pain control, PT, OT, prophylactic antibiotics, VTE prophylaxis, progressive ambulation and ADL's and discharge planning.The patient is planning to be discharged home with home health services

## 2016-03-26 NOTE — Anesthesia Procedure Notes (Signed)
Procedure Name: Intubation Date/Time: 03/26/2016 11:53 AM Performed by: Carleene Cooper A Pre-anesthesia Checklist: Patient identified, Timeout performed, Emergency Drugs available, Suction available and Patient being monitored Patient Re-evaluated:Patient Re-evaluated prior to inductionOxygen Delivery Method: Circle system utilized Preoxygenation: Pre-oxygenation with 100% oxygen Intubation Type: IV induction Ventilation: Mask ventilation without difficulty Laryngoscope Size: Mac and 4 Grade View: Grade I Tube type: Oral Tube size: 7.5 mm Number of attempts: 1 Airway Equipment and Method: Stylet Placement Confirmation: breath sounds checked- equal and bilateral,  ETT inserted through vocal cords under direct vision and positive ETCO2 Secured at: 22 cm Tube secured with: Tape Dental Injury: Teeth and Oropharynx as per pre-operative assessment

## 2016-03-26 NOTE — Transfer of Care (Signed)
Immediate Anesthesia Transfer of Care Note  Patient: [name]  Procedure(s) Performed: Procedure(s): RIGHT TOTAL HIP ARTHROPLASTY ANTERIOR APPROACH (Right)  Patient Location: PACU  Anesthesia Type:General  Level of Consciousness: awake, alert , oriented and patient cooperative  Airway & Oxygen Therapy: Patient Spontanous Breathing and Patient connected to face mask oxygen  Post-op Assessment: Report given to RN, Post -op Vital signs reviewed and stable and Patient moving all extremities  Post vital signs: Reviewed and stable  Last Vitals:  Filed Vitals:   03/26/16 1046  BP: 141/76  Pulse: 76  Temp: 36.6 C  Resp: 18    Last Pain: There were no vitals filed for this visit.    Patients Stated Pain Goal: 3 ([id number])  Complications: No apparent anesthesia complications

## 2016-03-26 NOTE — Anesthesia Postprocedure Evaluation (Signed)
Anesthesia Post Note  Patient: ARDA CESAR  Procedure(s) Performed: Procedure(s) (LRB): RIGHT TOTAL HIP ARTHROPLASTY ANTERIOR APPROACH (Right)  Patient location during evaluation: PACU Anesthesia Type: General Level of consciousness: awake and alert Pain management: pain level controlled Vital Signs Assessment: post-procedure vital signs reviewed and stable Respiratory status: spontaneous breathing, nonlabored ventilation, respiratory function stable and patient connected to nasal cannula oxygen Cardiovascular status: blood pressure returned to baseline and stable Postop Assessment: no signs of nausea or vomiting Anesthetic complications: no    Last Vitals:  Filed Vitals:   03/26/16 1637 03/26/16 1733  BP: 112/73 120/67  Pulse: 71 70  Temp: 36.8 C 36.6 C  Resp: 16 16    Last Pain:  Filed Vitals:   03/26/16 1733  PainSc: 0-No pain                 Catalina Gravel

## 2016-03-27 LAB — BASIC METABOLIC PANEL
Anion gap: 6 (ref 5–15)
BUN: 23 mg/dL — AB (ref 6–20)
CALCIUM: 8.4 mg/dL — AB (ref 8.9–10.3)
CHLORIDE: 106 mmol/L (ref 101–111)
CO2: 26 mmol/L (ref 22–32)
CREATININE: 1.27 mg/dL — AB (ref 0.61–1.24)
GFR calc Af Amer: >60 mL/min (ref >60)
GFR calc non Af Amer: 54 mL/min — ABNORMAL LOW (ref >60)
Glucose, Bld: 131 mg/dL — ABNORMAL HIGH (ref 65–99)
Potassium: 4.1 mmol/L (ref 3.5–5.1)
SODIUM: 138 mmol/L (ref 135–145)

## 2016-03-27 LAB — CBC
HCT: 26.2 % — ABNORMAL LOW (ref 39.0–52.0)
HEMOGLOBIN: 9.1 g/dL — AB (ref 13.0–17.0)
MCH: 33.6 pg (ref 26.0–34.0)
MCHC: 34.7 g/dL (ref 30.0–36.0)
MCV: 96.7 fL (ref 78.0–100.0)
Platelets: 187 10*3/uL (ref 150–400)
RBC: 2.71 MIL/uL — ABNORMAL LOW (ref 4.22–5.81)
RDW: 13.1 % (ref 11.5–15.5)
WBC: 8.4 10*3/uL (ref 4.0–10.5)

## 2016-03-27 MED ORDER — SENNA 8.6 MG PO TABS: 2.0000 | ORAL_TABLET | Freq: Every day | ORAL | Status: DC

## 2016-03-27 MED ORDER — DOCUSATE SODIUM 100 MG PO CAPS: 100.0000 mg | ORAL_CAPSULE | Freq: Two times a day (BID) | ORAL | Status: DC

## 2016-03-27 MED ORDER — ASPIRIN EC 325 MG PO TBEC: 325.0000 mg | DELAYED_RELEASE_TABLET | Freq: Two times a day (BID) | ORAL | Status: DC

## 2016-03-27 MED ORDER — HYDROCODONE-ACETAMINOPHEN 5-325 MG PO TABS: 1.0000 | ORAL_TABLET | ORAL | Status: DC | PRN

## 2016-03-27 MED ORDER — CEFADROXIL 500 MG PO CAPS
500.0000 mg | ORAL_CAPSULE | Freq: Two times a day (BID) | ORAL | Status: DC
  Administered 2016-03-27 – 2016-03-28 (×3): 500 mg via ORAL
  Filled 2016-03-27 (×3): qty 1

## 2016-03-27 MED ORDER — ONDANSETRON HCL 4 MG PO TABS: 4.0000 mg | ORAL_TABLET | Freq: Four times a day (QID) | ORAL | Status: DC | PRN

## 2016-03-27 MED ORDER — CEFADROXIL 500 MG PO CAPS: 500.0000 mg | ORAL_CAPSULE | Freq: Two times a day (BID) | ORAL | Status: DC

## 2016-03-27 NOTE — Progress Notes (Signed)
Physical Therapy Treatment Patient Details Name: John Hanson MRN: [id number] DOB: [birth year] Today's Date: 04-04-16    History of Present Illness s/p RIGHT TOTAL HIP ARTHROPLASTY ANTERIOR APPROACH (Right)    PT Comments    Pt progressing well with mobility and hopeful for dc home tomorrow.  Follow Up Recommendations  Home health PT     Equipment Recommendations  Rolling walker with 5" wheels    Recommendations for Other Services OT consult     Precautions / Restrictions Precautions Precautions: Fall Restrictions Weight Bearing Restrictions: No Other Position/Activity Restrictions: WBAT    Mobility  Bed Mobility Overal bed mobility: Needs Assistance Bed Mobility: Sit to Supine       Sit to supine: Min guard   General bed mobility comments: cues for sequence and use of L LE to self assist  Transfers Overall transfer level: Needs assistance Equipment used: Rolling walker (2 wheeled) Transfers: Sit to/from Stand Sit to Stand: Min guard         General transfer comment: cues for LE management and use of UEs to self assist  Ambulation/Gait Ambulation/Gait assistance: Min guard Ambulation Distance (Feet): 350 Feet Assistive device: Rolling walker (2 wheeled) Gait Pattern/deviations: Step-to pattern;Step-through pattern;Decreased step length - right;Decreased step length - left;Shuffle;Trunk flexed;Wide base of support Gait velocity: decr   General Gait Details: cues for posture and position from Rw   Stairs            Wheelchair Mobility    Modified Rankin (Stroke Patients Only)       Balance                                    Cognition Arousal/Alertness: Awake/alert Behavior During Therapy: WFL for tasks assessed/performed Overall Cognitive Status: Within Functional Limits for tasks assessed                      Exercises      General Comments        Pertinent Vitals/Pain Pain Assessment: 0-10 Pain  Score: 4  Pain Location: R hip Pain Descriptors / Indicators: Aching;Sore Pain Intervention(s): Limited activity within patient's tolerance;Monitored during session;Premedicated before session;Ice applied    Home Living                      Prior Function            PT Goals (current goals can now be found in the care plan section) Acute Rehab PT Goals Patient Stated Goal: home tomorrow PT Goal Formulation: With patient Potential to Achieve Goals: Good Progress towards PT goals: Progressing toward goals    Frequency  7X/week    PT Plan Current plan remains appropriate    Co-evaluation             End of Session Equipment Utilized During Treatment: Gait belt Activity Tolerance: Patient tolerated treatment well Patient left: in bed;with call bell/phone within reach     Time: 1345-1402 PT Time Calculation (min) (ACUTE ONLY): 17 min  Charges:  $Gait Training: 8-22 mins                    G Codes:      John Hanson 04/04/2016, 5:25 PM

## 2016-03-27 NOTE — Progress Notes (Signed)
Occupational Therapy Evaluation Patient Details Name: John Hanson MRN: [id number] DOB: 04-Apr-1941 Today's Date: 03/27/2016    History of Present Illness s/p RIGHT TOTAL HIP ARTHROPLASTY ANTERIOR APPROACH (Right)   Clinical Impression   All OT education completed and pt questions answered. No further OT needs at this time. Will sign off.    Follow Up Recommendations  No OT follow up;Supervision/Assistance - 24 hour    Equipment Recommendations  3 in 1 bedside comode    Recommendations for Other Services PT consult     Precautions / Restrictions Precautions Precautions: Fall Restrictions Weight Bearing Restrictions: No Other Position/Activity Restrictions: WBAT      Mobility Bed Mobility               General bed mobility comments: NT -- up in recliner  Transfers Overall transfer level: Needs assistance Equipment used: Rolling walker (2 wheeled) Transfers: Sit to/from Stand Sit to Stand: Min guard;From elevated surface              Balance                                            ADL Overall ADL's : Needs assistance/impaired         Upper Body Bathing: Set up;Sitting   Lower Body Bathing: Minimal assistance;Sit to/from stand   Upper Body Dressing : Set up;Sitting   Lower Body Dressing: Minimal assistance;Sit to/from stand   Toilet Transfer: Min guard;Cueing for sequencing;Ambulation;BSC;RW   Toileting- Water quality scientist and Hygiene: Min guard;Sit to/from stand       Functional mobility during ADLs: Min guard;Rolling walker General ADL Comments: Patient educated on LB dressing techniques and practiced donning boxer shorts during evaluation. Patient then ambulated to bathroom and practiced 3 in 1 over toilet transfer. He would like 3 in 1 for home. Patient verbally educated on tub transfer technique but plans to sponge bathe initially. All education completed and patient to have family assistance with ADLs at  home.     Vision     Perception     Praxis      Pertinent Vitals/Pain Pain Assessment: 0-10 Pain Score: 3  Pain Location: R hip Pain Descriptors / Indicators: Aching;Sore Pain Intervention(s): Monitored during session;Repositioned;Ice applied     Hand Dominance     Extremity/Trunk Assessment Upper Extremity Assessment Upper Extremity Assessment: Overall WFL for tasks assessed   Lower Extremity Assessment Lower Extremity Assessment: Defer to PT evaluation   Cervical / Trunk Assessment Cervical / Trunk Assessment: Normal   Communication Communication Communication: No difficulties   Cognition Arousal/Alertness: Awake/alert Behavior During Therapy: WFL for tasks assessed/performed Overall Cognitive Status: Within Functional Limits for tasks assessed                     General Comments       Exercises       Shoulder Instructions      Home Living Family/patient expects to be discharged to:: Private residence Living Arrangements: Alone Available Help at Discharge: Family;Available 24 hours/day Type of Home: House Home Access: Level entry     Home Layout: One level     Bathroom Shower/Tub: Teacher, Berania Peedin years/pre: Standard Bathroom Accessibility: Yes How Accessible: Accessible via walker Home Equipment: [place] - single point;Grab bars - tub/shower          Prior Functioning/Environment Level of  Independence: Independent with assistive device(s)        Comments: used cane in house and community    OT Diagnosis: Acute pain   OT Problem List: Decreased strength;Decreased range of motion;Decreased knowledge of use of DME or AE;Pain   OT Treatment/Interventions:      OT Goals(Current goals can be found in the care plan section) Acute Rehab OT Goals Patient Stated Goal: home tomorrow OT Goal Formulation: All assessment and education complete, DC therapy  OT Frequency:     Barriers to D/C:            Co-evaluation               End of Session Equipment Utilized During Treatment: Rolling walker  Activity Tolerance: Patient tolerated treatment well Patient left: in chair;with call bell/phone within reach;with chair alarm set   Time: [id number] OT Time Calculation (min): 15 min Charges:  OT General Charges $OT Visit: 1 Procedure OT Evaluation $OT Eval Low Complexity: 1 Procedure G-Codes:    Kimoni Pagliarulo A March 31, 2016, 12:40 PM

## 2016-03-27 NOTE — Evaluation (Signed)
Physical Therapy Evaluation Patient Details Name: John Hanson MRN: [id number] DOB: 03-04-1941 Today's Date: 03/27/2016   History of Present Illness  s/p RIGHT TOTAL HIP ARTHROPLASTY ANTERIOR APPROACH (Right)  Clinical Impression  Pt s/p R THR presents with decreased R LE strength/ROM and post op pain limiting functional mobility.  Pt should progress to dc home with family assist and HHPT follow up.    Follow Up Recommendations Home health PT    Equipment Recommendations  Rolling walker with 5" wheels    Recommendations for Other Services OT consult     Precautions / Restrictions Precautions Precautions: Fall Restrictions Weight Bearing Restrictions: No Other Position/Activity Restrictions: WBAT      Mobility  Bed Mobility Overal bed mobility: Needs Assistance Bed Mobility: Supine to Sit     Supine to sit: Min assist     General bed mobility comments: cues for sequence and use of L LE to self assist  Transfers Overall transfer level: Needs assistance Equipment used: Rolling walker (2 wheeled) Transfers: Sit to/from Stand Sit to Stand: Min assist         General transfer comment: cues for LE management and use of UEs to self assist  Ambulation/Gait Ambulation/Gait assistance: Min assist Ambulation Distance (Feet): 200 Feet Assistive device: Rolling walker (2 wheeled) Gait Pattern/deviations: Step-to pattern;Step-through pattern;Decreased step length - right;Decreased step length - left;Shuffle;Trunk flexed Gait velocity: decr Gait velocity interpretation: Below normal speed for age/gender General Gait Details: cues for posture and position from Rw  Stairs            Wheelchair Mobility    Modified Rankin (Stroke Patients Only)       Balance                                             Pertinent Vitals/Pain Pain Assessment: 0-10 Pain Score: 4  Pain Location: R hip Pain Descriptors / Indicators: Aching;Sore Pain  Intervention(s): Limited activity within patient's tolerance;Monitored during session;Premedicated before session;Ice applied    Home Living Family/patient expects to be discharged to:: Private residence Living Arrangements: Alone Available Help at Discharge: Family;Available 24 hours/day Type of Home: House Home Access: Level entry     Home Layout: One level Home Equipment: Cane - single point;Grab bars - tub/shower      Prior Function Level of Independence: Independent with assistive device(s)         Comments: used cane in house and community     Hand Dominance        Extremity/Trunk Assessment   Upper Extremity Assessment: Overall WFL for tasks assessed           Lower Extremity Assessment: RLE deficits/detail RLE Deficits / Details: Strength at hip 2+/5 with AAROM at hip to 80 flex and 20 abd    Cervical / Trunk Assessment: Normal  Communication   Communication: No difficulties  Cognition Arousal/Alertness: Awake/alert Behavior During Therapy: WFL for tasks assessed/performed Overall Cognitive Status: Within Functional Limits for tasks assessed                      General Comments      Exercises Total Joint Exercises Ankle Circles/Pumps: AROM;Both;15 reps;Supine Quad Sets: AROM;Both;15 reps;Supine Heel Slides: AAROM;Right;20 reps;Supine Hip ABduction/ADduction: AAROM;Right;15 reps;Supine      Assessment/Plan    PT Assessment Patient needs continued PT services  PT Diagnosis Difficulty  walking   PT Problem List Decreased strength;Decreased range of motion;Decreased activity tolerance;Decreased mobility;Decreased knowledge of use of DME;Obesity;Pain  PT Treatment Interventions DME instruction;Gait training;Stair training;Functional mobility training;Therapeutic activities;Therapeutic exercise;Patient/family education   PT Goals (Current goals can be found in the Care Plan section) Acute Rehab PT Goals Patient Stated Goal: home  tomorrow PT Goal Formulation: With patient Potential to Achieve Goals: Good    Frequency 7X/week   Barriers to discharge        Co-evaluation               End of Session   Activity Tolerance: Patient tolerated treatment well Patient left: in chair;with call bell/phone within reach;with chair alarm set;with family/visitor present Nurse Communication: Mobility status         Time: 0940-1007 PT Time Calculation (min) (ACUTE ONLY): 27 min   Charges:   PT Evaluation $PT Eval Low Complexity: 1 Procedure PT Treatments $Therapeutic Exercise: 8-22 mins   PT G Codes:        Anastasija Anfinson 26-Apr-2016, 1:03 PM

## 2016-03-27 NOTE — Progress Notes (Signed)
   Subjective:  Patient reports pain as mild to moderate.  C/o redness to left shin for about a week - did not mention this in preop holding yesterday.  Objective:   VITALS:   Filed Vitals:   03/26/16 1943 03/26/16 2305 03/27/16 0134 03/27/16 0639  BP: 117/72 139/79 132/70 127/65  Pulse: 69 72 72 75  Temp: 98.9 F (37.2 C) 98.7 F (37.1 C) 98.7 F (37.1 C) 98.9 F (37.2 C)  TempSrc: Oral Oral Oral Oral  Resp: 15 16 18 16   Height:      Weight:      SpO2: 100% 99% 99% 100%    ABD soft Sensation intact distally Intact pulses distally Dorsiflexion/Plantar flexion intact Incision: dressing C/D/I Compartment soft  LLE: erythema to distal tibia, no blisters / wounds / drainage. Mild TTP.   Lab Results  Component Value Date   WBC 8.4 03/27/2016   HGB 9.1* 03/27/2016   HCT 26.2* 03/27/2016   MCV 96.7 03/27/2016   PLT 187 03/27/2016   BMET    Component Value Date/Time   NA 138 03/27/2016 0441   K 4.1 03/27/2016 0441   CL 106 03/27/2016 0441   CO2 26 03/27/2016 0441   GLUCOSE 131* 03/27/2016 0441   BUN 23* 03/27/2016 0441   CREATININE 1.27* 03/27/2016 0441   CALCIUM 8.4* 03/27/2016 0441   GFRNONAA 54* 03/27/2016 0441   GFRAA >60 03/27/2016 0441     Assessment/Plan: 1 Day Post-Op   Principal Problem:   Primary osteoarthritis of right hip   WBAT RLE DVT ppx: ASA, SCDs, TEDs PO pain control Mild Cr bump: 1.27 this am (baseline ~0.9), hydration with IVF today, rechech in am ABLA: asymptomatic, monitor, transfuse for hgb < 7 LLE cellulitis: start cefadroxil, monitor Dispo: check am labs, likely d/c home tomorrow   Elie Goody 03/27/2016, 7:22 AM   Rod Can, MD Cell [phone]

## 2016-03-28 LAB — BASIC METABOLIC PANEL
Anion gap: 3 — ABNORMAL LOW (ref 5–15)
BUN: 22 mg/dL — ABNORMAL HIGH (ref 6–20)
CO2: 26 mmol/L (ref 22–32)
Calcium: 8.3 mg/dL — ABNORMAL LOW (ref 8.9–10.3)
Chloride: 106 mmol/L (ref 101–111)
Creatinine, Ser: 0.97 mg/dL (ref 0.61–1.24)
GFR calc Af Amer: >60 mL/min (ref >60)
GFR calc non Af Amer: >60 mL/min (ref >60)
Glucose, Bld: 131 mg/dL — ABNORMAL HIGH (ref 65–99)
Potassium: 4.2 mmol/L (ref 3.5–5.1)
Sodium: 135 mmol/L (ref 135–145)

## 2016-03-28 LAB — CBC
HCT: 23.9 % — ABNORMAL LOW (ref 39.0–52.0)
Hemoglobin: 8.4 g/dL — ABNORMAL LOW (ref 13.0–17.0)
MCH: 33.3 pg (ref 26.0–34.0)
MCHC: 35.1 g/dL (ref 30.0–36.0)
MCV: 94.8 fL (ref 78.0–100.0)
PLATELETS: 173 10*3/uL (ref 150–400)
RBC: 2.52 MIL/uL — ABNORMAL LOW (ref 4.22–5.81)
RDW: 12.7 % (ref 11.5–15.5)
WBC: 7 10*3/uL (ref 4.0–10.5)

## 2016-03-28 NOTE — Progress Notes (Signed)
Subjective: 2 Days Post-Op Procedure(s) (LRB): RIGHT TOTAL HIP ARTHROPLASTY ANTERIOR APPROACH (Right) Patient reports pain as 2 on 0-10 scale. Doing well today. CR 0.97   Objective: Vital signs in last 24 hours: Temp:  [97.5 F (36.4 C)-97.7 F (36.5 C)] 97.5 F (36.4 C) (07/01 0634) Pulse Rate:  [75-84] 84 (07/01 0634) Resp:  [15-16] 16 (07/01 0634) BP: (125-172)/(65-89) 172/89 mmHg (07/01 0634) SpO2:  [98 %] 98 % (07/01 0634)  Intake/Output from previous day: 06/30 0701 - 07/01 0700 In: 2848.7 [P.O.:840; I.V.:2008.7] Out: 1075 [Urine:1075] Intake/Output this shift:     Recent Labs  03/27/16 0441 03/28/16 0448  HGB 9.1* 8.4*    Recent Labs  03/27/16 0441 03/28/16 0448  WBC 8.4 7.0  RBC 2.71* 2.52*  HCT 26.2* 23.9*  PLT 187 173    Recent Labs  03/27/16 0441 03/28/16 0448  NA 138 135  K 4.1 4.2  CL 106 106  CO2 26 26  BUN 23* 22*  CREATININE 1.27* 0.97  GLUCOSE 131* 131*  CALCIUM 8.4* 8.3*   No results for input(s): LABPT, INR in the last 72 hours.  Dorsiflexion/Plantar flexion intact Compartment soft  Assessment/Plan: 2 Days Post-Op Procedure(s) (LRB): RIGHT TOTAL HIP ARTHROPLASTY ANTERIOR APPROACH (Right) Up with therapy Discharge home with home health  Anvika Gashi A 03/28/2016, 8:07 AM

## 2016-03-28 NOTE — Progress Notes (Signed)
Physical Therapy Treatment Patient Details Name: John Hanson MRN: [id number] DOB: 21-Jan-1941 Today's Date: 03/28/2016    History of Present Illness s/p RIGHT TOTAL HIP ARTHROPLASTY ANTERIOR APPROACH (Right)    PT Comments    Pt progressing well with mobility and eager for dc home  Follow Up Recommendations  Home health PT     Equipment Recommendations  Rolling walker with 5" wheels    Recommendations for Other Services OT consult     Precautions / Restrictions Precautions Precautions: Fall Restrictions Weight Bearing Restrictions: No Other Position/Activity Restrictions: WBAT    Mobility  Bed Mobility Overal bed mobility: Modified Independent Bed Mobility: Supine to Sit;Sit to Supine     Supine to sit: Modified independent (Device/Increase time) Sit to supine: Modified independent (Device/Increase time)   General bed mobility comments: Pt unassisted supine<>sit  Transfers Overall transfer level: Needs assistance Equipment used: Rolling walker (2 wheeled) Transfers: Sit to/from Stand Sit to Stand: Supervision         General transfer comment: min cues for use of UEs to self assist  Ambulation/Gait Ambulation/Gait assistance: Min guard;Supervision Ambulation Distance (Feet): 400 Feet Assistive device: Rolling walker (2 wheeled) Gait Pattern/deviations: Step-through pattern;Decreased step length - right;Decreased step length - left;Shuffle;Trunk flexed Gait velocity: decr   General Gait Details: cues for posture and position from Rw   Stairs Stairs: Yes Stairs assistance: Min assist Stair Management: No rails;Step to pattern;Forwards;With walker Number of Stairs: 2 General stair comments: single step twice with RW and cues for sequence  Wheelchair Mobility    Modified Rankin (Stroke Patients Only)       Balance                                    Cognition Arousal/Alertness: Awake/alert Behavior During Therapy: WFL for  tasks assessed/performed Overall Cognitive Status: Within Functional Limits for tasks assessed                      Exercises Total Joint Exercises Ankle Circles/Pumps: AROM;Both;15 reps;Supine Quad Sets: AROM;Both;15 reps;Supine Heel Slides: AAROM;Right;20 reps;Supine Hip ABduction/ADduction: AAROM;Right;15 reps;Supine Long Arc Quad: AROM;Both;10 reps;Seated    General Comments        Pertinent Vitals/Pain Pain Assessment: 0-10 Pain Score: 4  Pain Location: R hip Pain Descriptors / Indicators: Aching;Sore Pain Intervention(s): Limited activity within patient's tolerance;Monitored during session;Premedicated before session;Ice applied    Home Living                      Prior Function            PT Goals (current goals can now be found in the care plan section) Acute Rehab PT Goals Patient Stated Goal: home today PT Goal Formulation: With patient Potential to Achieve Goals: Good Progress towards PT goals: Progressing toward goals    Frequency  7X/week    PT Plan Current plan remains appropriate    Co-evaluation             End of Session Equipment Utilized During Treatment: Gait belt Activity Tolerance: Patient tolerated treatment well Patient left: in chair;with call bell/phone within reach;with chair alarm set     Time: [id number] PT Time Calculation (min) (ACUTE ONLY): 26 min  Charges:  $Gait Training: 8-22 mins $Therapeutic Exercise: 8-22 mins  G Codes:      Amparo Donalson 04-19-2016, 1:22 PM

## 2016-03-28 NOTE — Care Management Note (Signed)
Case Management Note  Patient Details  Name: John Hanson MRN: [id number] Date of Birth: [birth year]  Subjective/Objective:                  RIGHT TOTAL HIP ARTHROPLASTY ANTERIOR APPROACH (Right) Action/Plan: Discharge planning Expected Discharge Date:  03/27/16               Expected Discharge Plan:  [place]  In-House Referral:     Discharge planning Services  CM Consult  Post Acute Care Choice:    Choice offered to:  Patient  DME Arranged:  3-N-1, Walker rolling DME Agency:  [place]:  PT [place]:  [facility] (now [facility])  Status of Service:  Completed, signed off  If discussed at [employer] of Stay Meetings, dates discussed:    Additional Comments: CM spoke with pt to offer choice of home health agency.  Pt chooses [facility] to render HHPT.  Referral called to [employer], [place].  Cm called [facility] DME rep, Jeneen Rinks to please deliver the 3n1 and rolling walker to room prior to discharge.  No other CM needs were communicated. Dellie Catholic, RN 03/28/2016, 9:05 AM

## 2016-03-29 DIAGNOSIS — Z96641 Presence of right artificial hip joint: Secondary | ICD-10-CM | POA: Diagnosis not present

## 2016-03-29 DIAGNOSIS — I10 Essential (primary) hypertension: Secondary | ICD-10-CM | POA: Diagnosis not present

## 2016-03-29 DIAGNOSIS — M1990 Unspecified osteoarthritis, unspecified site: Secondary | ICD-10-CM | POA: Diagnosis not present

## 2016-03-29 DIAGNOSIS — Z471 Aftercare following joint replacement surgery: Secondary | ICD-10-CM | POA: Diagnosis not present

## 2016-03-29 DIAGNOSIS — M4806 Spinal stenosis, lumbar region: Secondary | ICD-10-CM | POA: Diagnosis not present

## 2016-03-29 DIAGNOSIS — I83899 Varicose veins of unspecified lower extremities with other complications: Secondary | ICD-10-CM | POA: Diagnosis not present

## 2016-04-02 DIAGNOSIS — I10 Essential (primary) hypertension: Secondary | ICD-10-CM | POA: Diagnosis not present

## 2016-04-02 DIAGNOSIS — M1990 Unspecified osteoarthritis, unspecified site: Secondary | ICD-10-CM | POA: Diagnosis not present

## 2016-04-02 DIAGNOSIS — Z96641 Presence of right artificial hip joint: Secondary | ICD-10-CM | POA: Diagnosis not present

## 2016-04-02 DIAGNOSIS — M4806 Spinal stenosis, lumbar region: Secondary | ICD-10-CM | POA: Diagnosis not present

## 2016-04-02 DIAGNOSIS — I83899 Varicose veins of unspecified lower extremities with other complications: Secondary | ICD-10-CM | POA: Diagnosis not present

## 2016-04-02 DIAGNOSIS — Z471 Aftercare following joint replacement surgery: Secondary | ICD-10-CM | POA: Diagnosis not present

## 2016-04-06 DIAGNOSIS — M1990 Unspecified osteoarthritis, unspecified site: Secondary | ICD-10-CM | POA: Diagnosis not present

## 2016-04-06 DIAGNOSIS — I83899 Varicose veins of unspecified lower extremities with other complications: Secondary | ICD-10-CM | POA: Diagnosis not present

## 2016-04-06 DIAGNOSIS — M4806 Spinal stenosis, lumbar region: Secondary | ICD-10-CM | POA: Diagnosis not present

## 2016-04-06 DIAGNOSIS — I10 Essential (primary) hypertension: Secondary | ICD-10-CM | POA: Diagnosis not present

## 2016-04-06 DIAGNOSIS — Z96641 Presence of right artificial hip joint: Secondary | ICD-10-CM | POA: Diagnosis not present

## 2016-04-06 DIAGNOSIS — Z471 Aftercare following joint replacement surgery: Secondary | ICD-10-CM | POA: Diagnosis not present

## 2016-04-08 DIAGNOSIS — I83899 Varicose veins of unspecified lower extremities with other complications: Secondary | ICD-10-CM | POA: Diagnosis not present

## 2016-04-08 DIAGNOSIS — I10 Essential (primary) hypertension: Secondary | ICD-10-CM | POA: Diagnosis not present

## 2016-04-08 DIAGNOSIS — Z471 Aftercare following joint replacement surgery: Secondary | ICD-10-CM | POA: Diagnosis not present

## 2016-04-08 DIAGNOSIS — M1990 Unspecified osteoarthritis, unspecified site: Secondary | ICD-10-CM | POA: Diagnosis not present

## 2016-04-08 DIAGNOSIS — M4806 Spinal stenosis, lumbar region: Secondary | ICD-10-CM | POA: Diagnosis not present

## 2016-04-08 DIAGNOSIS — Z96641 Presence of right artificial hip joint: Secondary | ICD-10-CM | POA: Diagnosis not present

## 2016-04-09 DIAGNOSIS — Z471 Aftercare following joint replacement surgery: Secondary | ICD-10-CM | POA: Diagnosis not present

## 2016-04-09 DIAGNOSIS — Z96641 Presence of right artificial hip joint: Secondary | ICD-10-CM | POA: Diagnosis not present

## 2016-04-15 DIAGNOSIS — I83899 Varicose veins of unspecified lower extremities with other complications: Secondary | ICD-10-CM | POA: Diagnosis not present

## 2016-04-15 DIAGNOSIS — Z471 Aftercare following joint replacement surgery: Secondary | ICD-10-CM | POA: Diagnosis not present

## 2016-04-15 DIAGNOSIS — Z96641 Presence of right artificial hip joint: Secondary | ICD-10-CM | POA: Diagnosis not present

## 2016-04-15 DIAGNOSIS — M1990 Unspecified osteoarthritis, unspecified site: Secondary | ICD-10-CM | POA: Diagnosis not present

## 2016-04-15 DIAGNOSIS — I10 Essential (primary) hypertension: Secondary | ICD-10-CM | POA: Diagnosis not present

## 2016-04-15 DIAGNOSIS — M4806 Spinal stenosis, lumbar region: Secondary | ICD-10-CM | POA: Diagnosis not present

## 2016-05-07 DIAGNOSIS — Z96641 Presence of right artificial hip joint: Secondary | ICD-10-CM | POA: Diagnosis not present

## 2016-05-07 DIAGNOSIS — Z471 Aftercare following joint replacement surgery: Secondary | ICD-10-CM | POA: Diagnosis not present

## 2016-06-16 DIAGNOSIS — Z23 Encounter for immunization: Secondary | ICD-10-CM | POA: Diagnosis not present

## 2016-06-26 ENCOUNTER — Ambulatory Visit: Payer: Self-pay | Admitting: Orthopedic Surgery

## 2016-07-16 ENCOUNTER — Encounter (HOSPITAL_COMMUNITY): Payer: Self-pay

## 2016-07-16 NOTE — Patient Instructions (Signed)
JACARIE BOUKNIGHT  07/16/2016   Your procedure is scheduled on: 07-30-16  Report to [facility]  Entrance take [facility]  elevators to 3rd floor to  [place] at  1045 AM.  Call this number if you have problems the morning of surgery [phone]   Remember: ONLY 1 PERSON MAY GO WITH YOU TO SHORT STAY TO GET  READY MORNING OF [place].  Do not eat food or drink liquids :After Midnight.     Take these medicines the morning of surgery with A SIP OF WATER: allopurinol, amlodipine (norvasc), citalopram (celexa), labetalol, omeprazole (prilosec)              You may not have any metal on your body including hair pins and              piercings  Do not wear jewelry, make-up, lotions, powders or perfumes, deodorant             Do not wear nail polish.  Do not shave  48 hours prior to surgery.              Men may shave face and neck.   Do not bring valuables to the hospital. [place].  Contacts, dentures or bridgework may not be worn into surgery.  Leave suitcase in the car. After surgery it may be brought to your room.                  Please read over the following fact sheets you were given: _____________________________________________________________________             [facility] - Preparing for Surgery Before surgery, you can play an important role.  Because skin is not sterile, your skin needs to be as free of germs as possible.  You can reduce the number of germs on your skin by washing with CHG (chlorahexidine gluconate) soap before surgery.  CHG is an antiseptic cleaner which kills germs and bonds with the skin to continue killing germs even after washing. Please DO NOT use if you have an allergy to CHG or antibacterial soaps.  If your skin becomes reddened/irritated stop using the CHG and inform your nurse when you arrive at Short Stay. Do not shave (including legs and underarms) for at  least 48 hours prior to the first CHG shower.  You may shave your face/neck. Please follow these instructions carefully:  1.  Shower with CHG Soap the night before surgery and the  morning of Surgery.  2.  If you choose to wash your hair, wash your hair first as usual with your  normal  shampoo.  3.  After you shampoo, rinse your hair and body thoroughly to remove the  shampoo.                           4.  Use CHG as you would any other liquid soap.  You can apply chg directly  to the skin and wash                       Gently with a scrungie or clean washcloth.  5.  Apply the CHG Soap to your body ONLY FROM THE NECK DOWN.  Do not use on face/ open                           Wound or open sores. Avoid contact with eyes, ears mouth and genitals (private parts).                       Wash face,  Genitals (private parts) with your normal soap.             6.  Wash thoroughly, paying special attention to the area where your surgery  will be performed.  7.  Thoroughly rinse your body with warm water from the neck down.  8.  DO NOT shower/wash with your normal soap after using and rinsing off  the CHG Soap.                9.  Pat yourself dry with a clean towel.            10.  Wear clean pajamas.            11.  Place clean sheets on your bed the night of your first shower and do not  sleep with pets. Day of Surgery : Do not apply any lotions/deodorants the morning of surgery.  Please wear clean clothes to the hospital/surgery center.  FAILURE TO FOLLOW THESE INSTRUCTIONS MAY RESULT IN THE CANCELLATION OF YOUR SURGERY PATIENT SIGNATURE_________________________________  NURSE SIGNATURE__________________________________  ________________________________________________________________________   [name]  An incentive spirometer is a tool that can help keep your lungs clear and active. This tool measures how well you are filling your lungs with each breath. Taking long deep breaths  may help reverse or decrease the chance of developing breathing (pulmonary) problems (especially infection) following:  A long period of time when you are unable to move or be active. BEFORE THE PROCEDURE   If the spirometer includes an indicator to show your best effort, your nurse or respiratory therapist will set it to a desired goal.  If possible, sit up straight or lean slightly forward. Try not to slouch.  Hold the incentive spirometer in an upright position. INSTRUCTIONS FOR USE  1. Sit on the edge of your bed if possible, or sit up as far as you can in bed or on a chair. 2. Hold the incentive spirometer in an upright position. 3. Breathe out normally. 4. Place the mouthpiece in your mouth and seal your lips tightly around it. 5. Breathe in slowly and as deeply as possible, raising the piston or the ball toward the top of the column. 6. Hold your breath for 3-5 seconds or for as long as possible. Allow the piston or ball to fall to the bottom of the column. 7. Remove the mouthpiece from your mouth and breathe out normally. 8. Rest for a few seconds and repeat Steps 1 through 7 at least 10 times every 1-2 hours when you are awake. Take your time and take a few normal breaths between deep breaths. 9. The spirometer may include an indicator to show your best effort. Use the indicator as a goal to work toward during each repetition. 10. After each set of 10 deep breaths, practice coughing to be sure your lungs are clear. If you have an incision (the cut made at the time of surgery), support your incision when coughing by placing a pillow or rolled up towels firmly against it. Once you are able to get out of  bed, walk around indoors and cough well. You may stop using the incentive spirometer when instructed by your caregiver.  RISKS AND COMPLICATIONS  Take your time so you do not get dizzy or light-headed.  If you are in pain, you may need to take or ask for pain medication before doing  incentive spirometry. It is harder to take a deep breath if you are having pain. AFTER USE  Rest and breathe slowly and easily.  It can be helpful to keep track of a log of your progress. Your caregiver can provide you with a simple table to help with this. If you are using the spirometer at home, follow these instructions: [place] IF:   You are having difficultly using the spirometer.  You have trouble using the spirometer as often as instructed.  Your pain medication is not giving enough relief while using the spirometer.  You develop fever of 100.5 F (38.1 C) or higher. SEEK IMMEDIATE MEDICAL CARE IF:   You cough up bloody sputum that had not been present before.  You develop fever of 102 F (38.9 C) or greater.  You develop worsening pain at or near the incision site. MAKE SURE YOU:   Understand these instructions.  Will watch your condition.  Will get help right away if you are not doing well or get worse. Document Released: 01/25/2007 Document Revised: 12/07/2011 Document Reviewed: 03/28/2007 Exit[employer] Patient Information 2014 Exit[employer], [place].   ________________________________________________________________________  WHAT IS A BLOOD TRANSFUSION? Blood Transfusion Information  A transfusion is the replacement of blood or some of its parts. Blood is made up of multiple cells which provide different functions.  Red blood cells carry oxygen and are used for blood loss replacement.  White blood cells fight against infection.  Platelets control bleeding.  Plasma helps clot blood.  Other blood products are available for specialized needs, such as hemophilia or other clotting disorders. BEFORE THE TRANSFUSION  Who gives blood for transfusions?   Healthy volunteers who are fully evaluated to make sure their blood is safe. This is blood bank blood. Transfusion therapy is the safest it has ever been in the practice of medicine. Before blood is taken from a  donor, a complete history is taken to make sure that person has no history of diseases nor engages in risky social behavior (examples are intravenous drug use or sexual activity with multiple partners). The donor's travel history is screened to minimize risk of transmitting infections, such as malaria. The donated blood is tested for signs of infectious diseases, such as HIV and hepatitis. The blood is then tested to be sure it is compatible with you in order to minimize the chance of a transfusion reaction. If you or a relative donates blood, this is often done in anticipation of surgery and is not appropriate for emergency situations. It takes many days to process the donated blood. RISKS AND COMPLICATIONS Although transfusion therapy is very safe and saves many lives, the main dangers of transfusion include:   Getting an infectious disease.  Developing a transfusion reaction. This is an allergic reaction to something in the blood you were given. Every precaution is taken to prevent this. The decision to have a blood transfusion has been considered carefully by your caregiver before blood is given. Blood is not given unless the benefits outweigh the risks. AFTER THE TRANSFUSION  Right after receiving a blood transfusion, you will usually feel much better and more energetic. This is especially true if your red blood  cells have gotten low (anemic). The transfusion raises the level of the red blood cells which carry oxygen, and this usually causes an energy increase.  The nurse administering the transfusion will monitor you carefully for complications. HOME CARE INSTRUCTIONS  No special instructions are needed after a transfusion. You may find your energy is better. Speak with your caregiver about any limitations on activity for underlying diseases you may have. SEEK MEDICAL CARE IF:   Your condition is not improving after your transfusion.  You develop redness or irritation at the intravenous (IV)  site. SEEK IMMEDIATE MEDICAL CARE IF:  Any of the following symptoms occur over the next 12 hours:  Shaking chills.  You have a temperature by mouth above 102 F (38.9 C), not controlled by medicine.  Chest, back, or muscle pain.  People around you feel you are not acting correctly or are confused.  Shortness of breath or difficulty breathing.  Dizziness and fainting.  You get a rash or develop hives.  You have a decrease in urine output.  Your urine turns a dark color or changes to pink, red, or brown. Any of the following symptoms occur over the next 10 days:  You have a temperature by mouth above 102 F (38.9 C), not controlled by medicine.  Shortness of breath.  Weakness after normal activity.  The white part of the eye turns yellow (jaundice).  You have a decrease in the amount of urine or are urinating less often.  Your urine turns a dark color or changes to pink, red, or brown. Document Released: 09/11/2000 Document Revised: 12/07/2011 Document Reviewed: 04/30/2008 [facility] Patient Information 2014 [place], [place].  _______________________________________________________________________

## 2016-07-16 NOTE — Progress Notes (Signed)
Chest xray 02-07-16 epic Echo 07-25-15 epic ekg 03-25-15 epic  cardiac clearance note christopher barge np 03-24-16 epic

## 2016-07-20 ENCOUNTER — Encounter (HOSPITAL_COMMUNITY): Payer: Self-pay

## 2016-07-20 ENCOUNTER — Encounter (HOSPITAL_COMMUNITY)
Admission: RE | Admit: 2016-07-20 | Discharge: 2016-07-20 | Disposition: A | Discharge status: Home / Self Care | Payer: Commercial Managed Care - HMO | Source: Ambulatory Visit | Attending: Orthopedic Surgery | Admitting: Orthopedic Surgery

## 2016-07-20 DIAGNOSIS — I10 Essential (primary) hypertension: Secondary | ICD-10-CM | POA: Insufficient documentation

## 2016-07-20 DIAGNOSIS — Z01818 Encounter for other preprocedural examination: Secondary | ICD-10-CM | POA: Diagnosis not present

## 2016-07-20 LAB — CBC
HCT: 35.9 % — ABNORMAL LOW (ref 39.0–52.0)
Hemoglobin: 11.9 g/dL — ABNORMAL LOW (ref 13.0–17.0)
MCH: 31.5 pg (ref 26.0–34.0)
MCHC: 33.1 g/dL (ref 30.0–36.0)
MCV: 95 fL (ref 78.0–100.0)
PLATELETS: 215 10*3/uL (ref 150–400)
RBC: 3.78 MIL/uL — AB (ref 4.22–5.81)
RDW: 14.8 % (ref 11.5–15.5)
WBC: 5.5 10*3/uL (ref 4.0–10.5)

## 2016-07-20 LAB — BASIC METABOLIC PANEL
ANION GAP: 7 (ref 5–15)
BUN: 16 mg/dL (ref 6–20)
CO2: 26 mmol/L (ref 22–32)
Calcium: 9.3 mg/dL (ref 8.9–10.3)
Chloride: 105 mmol/L (ref 101–111)
Creatinine, Ser: 0.85 mg/dL (ref 0.61–1.24)
GFR calc Af Amer: >60 mL/min (ref >60)
Glucose, Bld: 94 mg/dL (ref 65–99)
POTASSIUM: 3.2 mmol/L — AB (ref 3.5–5.1)
SODIUM: 138 mmol/L (ref 135–145)

## 2016-07-20 LAB — SURGICAL PCR SCREEN
MRSA, PCR: NEGATIVE
Staphylococcus aureus: NEGATIVE

## 2016-07-21 ENCOUNTER — Ambulatory Visit: Payer: Self-pay | Admitting: Orthopedic Surgery

## 2016-07-21 NOTE — H&P (Signed)
TOTAL HIP ADMISSION H&P  Patient is admitted for left total hip arthroplasty.  Subjective:  Chief Complaint: left hip pain  HPI: John Hanson, 75 y.o. male, has a history of pain and functional disability in the left hip(s) due to arthritis and patient has failed non-surgical conservative treatments for greater than 12 weeks to include NSAID's and/or analgesics, flexibility and strengthening excercises, supervised PT with diminished ADL's post treatment, use of assistive devices, weight reduction as appropriate and activity modification.  Onset of symptoms was gradual starting >10 years ago with gradually worsening course since that time.The patient noted no past surgery on the left hip(s).  Patient currently rates pain in the left hip at 10 out of 10 with activity. Patient has night pain, worsening of pain with activity and weight bearing, trendelenberg gait, pain that interfers with activities of daily living, pain with passive range of motion and crepitus. Patient has evidence of subchondral cysts, subchondral sclerosis, periarticular osteophytes and joint space narrowing by imaging studies. This condition presents safety issues increasing the risk of falls. There is no current active infection.  Patient Active Problem List   Diagnosis Date Noted  . Primary osteoarthritis of right hip 03/26/2016  . Essential hypertension   . Aortic stenosis   . Osteoarthritis   . Spinal stenosis of lumbar region 09/04/2015  . HTN (hypertension) 07/18/2015  . Murmur 07/18/2015  . Preoperative cardiovascular examination 07/18/2015  . Varicose veins of lower extremities with other complications [id number]  . Pain in limb 02/08/2014   Past Medical History:  Diagnosis Date  . Anxiety   . Aortic stenosis    a. 06/2015 Echo: EF 50-55%, mild diff HK, Gr1 DD, mild AS w/ restricted mobility of R coronary and non-coronary cusp, mildly dil Ao root (94mm), mildly dil LA, nl RV fxn, mild TR/PR, PASP 18mmHg.   . Arthritis   . Depression   . Essential hypertension   . GERD (gastroesophageal reflux disease)   . Numbness and tingling    fingertips bilat   . Osteoarthritis    a. R>L hip pending R THA.  . Sleep apnea    a. pt states does not use CPAP machine   . Varicose veins     Past Surgical History:  Procedure Laterality Date  . ENDOVENOUS ABLATION SAPHENOUS VEIN W/ LASER Right 05-31-2014   EVLA  right gretaer saphenous vein by Curt Jews MD  . LUMBAR LAMINECTOMY/DECOMPRESSION MICRODISCECTOMY N/A 09/04/2015   Procedure: MICRO LUMBAR DECOMPRESSION L4 - L5,  L3 - L4 2 LEVELS;  Surgeon: Susa Day, MD;  Location: [facility];  Service: Orthopedics;  Laterality: N/A;  . TOTAL HIP ARTHROPLASTY Right 03/26/2016   Procedure: RIGHT TOTAL HIP ARTHROPLASTY ANTERIOR APPROACH;  Surgeon: Rod Can, MD;  Location: [facility];  Service: Orthopedics;  Laterality: Right;     (Not in a hospital admission) No Known Allergies  Social History  Substance Use Topics  . Smoking status: Former Smoker    Years: 5.00    Types: Cigars    Quit date: 09/28/1968  . Smokeless tobacco: Never Used  . Alcohol use No    Family History  Problem Relation Age of Onset  . Kidney disease Mother      Review of Systems  Constitutional: Negative.   HENT: Negative.   Eyes: Negative.   Respiratory: Negative.   Cardiovascular: Negative.   Gastrointestinal: Negative.   Genitourinary: Negative.   Musculoskeletal: Positive for back pain and joint pain.  Skin: Negative.   Neurological: Negative.  Endo/Heme/Allergies: Negative.   Psychiatric/Behavioral: Negative.     Objective:  Physical Exam  Vitals reviewed. Constitutional: He is oriented to person, place, and time. He appears well-developed and well-nourished.  HENT:  Head: Normocephalic and atraumatic.  Eyes: Conjunctivae and EOM are normal. Pupils are equal, round, and reactive to light.  Neck: Normal range of motion. Neck supple.  Cardiovascular: Normal rate  and intact distal pulses.   Respiratory: Effort normal. No respiratory distress.  GI: Soft. Bowel sounds are normal. He exhibits no distension.  Genitourinary:  Genitourinary Comments: deferred  Musculoskeletal:       Left hip: He exhibits decreased range of motion and crepitus.  Neurological: He is alert and oriented to person, place, and time. He has normal reflexes.  Skin: Skin is warm and dry.  Psychiatric: He has a normal mood and affect. His behavior is normal. Judgment and thought content normal.    Vital signs in last 24 hours: @VSRANGES @  Labs:   Estimated body mass index is 37.16 kg/m as calculated from the following:   Height as of 03/26/16: 5\' 8"  (1.727 m).   Weight as of 03/26/16: 110.9 kg (244 lb 6.4 oz).   Imaging Review Plain radiographs demonstrate severe degenerative joint disease of the left hip(s). The bone quality appears to be adequate for age and reported activity level.  Assessment/Plan:  End stage arthritis, left hip(s)  The patient history, physical examination, clinical judgement of the provider and imaging studies are consistent with end stage degenerative joint disease of the left hip(s) and total hip arthroplasty is deemed medically necessary. The treatment options including medical management, injection therapy, arthroscopy and arthroplasty were discussed at length. The risks and benefits of total hip arthroplasty were presented and reviewed. The risks due to aseptic loosening, infection, stiffness, dislocation/subluxation,  thromboembolic complications and other imponderables were discussed.  The patient acknowledged the explanation, agreed to proceed with the plan and consent was signed. Patient is being admitted for inpatient treatment for surgery, pain control, PT, OT, prophylactic antibiotics, VTE prophylaxis, progressive ambulation and ADL's and discharge planning.The patient is planning to be discharged home with home health services

## 2016-07-30 ENCOUNTER — Encounter (HOSPITAL_COMMUNITY): Payer: Self-pay

## 2016-07-30 ENCOUNTER — Inpatient Hospital Stay (HOSPITAL_COMMUNITY): Payer: Commercial Managed Care - HMO

## 2016-07-30 ENCOUNTER — Inpatient Hospital Stay (HOSPITAL_COMMUNITY): Payer: Commercial Managed Care - HMO | Admitting: Anesthesiology

## 2016-07-30 ENCOUNTER — Inpatient Hospital Stay (HOSPITAL_COMMUNITY)
Admission: RE | Admit: 2016-07-30 | Discharge: 2016-07-31 | DRG: 470 | Disposition: A | Discharge status: Home Health | Payer: Commercial Managed Care - HMO | Source: Ambulatory Visit | Attending: Orthopedic Surgery | Admitting: Orthopedic Surgery

## 2016-07-30 ENCOUNTER — Encounter (HOSPITAL_COMMUNITY)
Admission: RE | Disposition: A | Discharge status: Home Health | Payer: Self-pay | Source: Ambulatory Visit | Attending: Orthopedic Surgery

## 2016-07-30 DIAGNOSIS — F329 Major depressive disorder, single episode, unspecified: Secondary | ICD-10-CM | POA: Diagnosis present

## 2016-07-30 DIAGNOSIS — I350 Nonrheumatic aortic (valve) stenosis: Secondary | ICD-10-CM | POA: Diagnosis not present

## 2016-07-30 DIAGNOSIS — Z87891 Personal history of nicotine dependence: Secondary | ICD-10-CM

## 2016-07-30 DIAGNOSIS — I8390 Asymptomatic varicose veins of unspecified lower extremity: Secondary | ICD-10-CM | POA: Diagnosis present

## 2016-07-30 DIAGNOSIS — Z6837 Body mass index (BMI) 37.0-37.9, adult: Secondary | ICD-10-CM | POA: Diagnosis not present

## 2016-07-30 DIAGNOSIS — Z09 Encounter for follow-up examination after completed treatment for conditions other than malignant neoplasm: Secondary | ICD-10-CM

## 2016-07-30 DIAGNOSIS — I10 Essential (primary) hypertension: Secondary | ICD-10-CM | POA: Diagnosis not present

## 2016-07-30 DIAGNOSIS — E669 Obesity, unspecified: Secondary | ICD-10-CM | POA: Diagnosis present

## 2016-07-30 DIAGNOSIS — M25559 Pain in unspecified hip: Secondary | ICD-10-CM

## 2016-07-30 DIAGNOSIS — G4730 Sleep apnea, unspecified: Secondary | ICD-10-CM | POA: Diagnosis not present

## 2016-07-30 DIAGNOSIS — Z471 Aftercare following joint replacement surgery: Secondary | ICD-10-CM | POA: Diagnosis not present

## 2016-07-30 DIAGNOSIS — K219 Gastro-esophageal reflux disease without esophagitis: Secondary | ICD-10-CM | POA: Diagnosis present

## 2016-07-30 DIAGNOSIS — Z96642 Presence of left artificial hip joint: Secondary | ICD-10-CM | POA: Diagnosis not present

## 2016-07-30 DIAGNOSIS — M1612 Unilateral primary osteoarthritis, left hip: Principal | ICD-10-CM | POA: Diagnosis present

## 2016-07-30 DIAGNOSIS — Z96641 Presence of right artificial hip joint: Secondary | ICD-10-CM | POA: Diagnosis present

## 2016-07-30 DIAGNOSIS — M48061 Spinal stenosis, lumbar region without neurogenic claudication: Secondary | ICD-10-CM | POA: Diagnosis not present

## 2016-07-30 HISTORY — PX: TOTAL HIP ARTHROPLASTY: SHX124

## 2016-07-30 LAB — TYPE AND SCREEN
ABO/RH(D): O POS
ANTIBODY SCREEN: NEGATIVE

## 2016-07-30 SURGERY — ARTHROPLASTY, HIP, TOTAL, ANTERIOR APPROACH
Anesthesia: General | Site: Hip | Laterality: Left

## 2016-07-30 MED ORDER — SUGAMMADEX SODIUM 200 MG/2ML IV SOLN
INTRAVENOUS | Status: AC
  Filled 2016-07-30: qty 4

## 2016-07-30 MED ORDER — ALBUMIN HUMAN 5 % IV SOLN
INTRAVENOUS | Status: AC
  Filled 2016-07-30: qty 500

## 2016-07-30 MED ORDER — BUPIVACAINE HCL (PF) 0.25 % IJ SOLN
INTRAMUSCULAR | Status: DC | PRN
  Administered 2016-07-30: 30 mL

## 2016-07-30 MED ORDER — SUGAMMADEX SODIUM 200 MG/2ML IV SOLN
INTRAVENOUS | Status: AC
  Filled 2016-07-30: qty 2

## 2016-07-30 MED ORDER — PANTOPRAZOLE SODIUM 40 MG PO TBEC
80.0000 mg | DELAYED_RELEASE_TABLET | Freq: Every day | ORAL | Status: DC
  Administered 2016-07-31: 80 mg via ORAL
  Filled 2016-07-30: qty 2

## 2016-07-30 MED ORDER — FENTANYL CITRATE (PF) 100 MCG/2ML IJ SOLN
INTRAMUSCULAR | Status: AC
  Filled 2016-07-30: qty 4

## 2016-07-30 MED ORDER — ASPIRIN EC 325 MG PO TBEC
325.0000 mg | DELAYED_RELEASE_TABLET | Freq: Two times a day (BID) | ORAL | Status: DC
  Administered 2016-07-31: 325 mg via ORAL
  Filled 2016-07-30: qty 1

## 2016-07-30 MED ORDER — PHENOL 1.4 % MT LIQD
1.0000 | OROMUCOSAL | Status: DC | PRN
Start: 2016-07-30 — End: 2016-07-31

## 2016-07-30 MED ORDER — ISOPROPYL ALCOHOL 70 % SOLN
Status: DC | PRN
  Administered 2016-07-30: 1 via TOPICAL

## 2016-07-30 MED ORDER — FENTANYL CITRATE (PF) 100 MCG/2ML IJ SOLN
25.0000 ug | INTRAMUSCULAR | Status: DC | PRN
  Administered 2016-07-30 (×2): 50 ug via INTRAVENOUS

## 2016-07-30 MED ORDER — AMLODIPINE BESYLATE 10 MG PO TABS: 10.0000 mg | ORAL_TABLET | Freq: Every day | ORAL | Status: DC

## 2016-07-30 MED ORDER — LABETALOL HCL 300 MG PO TABS
300.0000 mg | ORAL_TABLET | Freq: Three times a day (TID) | ORAL | Status: DC
  Administered 2016-07-30 – 2016-07-31 (×2): 300 mg via ORAL
  Filled 2016-07-30 (×3): qty 1

## 2016-07-30 MED ORDER — DEXAMETHASONE SODIUM PHOSPHATE 10 MG/ML IJ SOLN
10.0000 mg | Freq: Once | INTRAMUSCULAR | Status: AC
  Administered 2016-07-31: 10 mg via INTRAVENOUS
  Filled 2016-07-30: qty 1

## 2016-07-30 MED ORDER — METHOCARBAMOL 1000 MG/10ML IJ SOLN
500.0000 mg | Freq: Four times a day (QID) | INTRAVENOUS | Status: DC | PRN
  Administered 2016-07-30: 500 mg via INTRAVENOUS
  Filled 2016-07-30: qty 550
  Filled 2016-07-30: qty 5

## 2016-07-30 MED ORDER — TRAZODONE HCL 50 MG PO TABS
150.0000 mg | ORAL_TABLET | Freq: Every day | ORAL | Status: DC
  Administered 2016-07-30: 150 mg via ORAL
  Filled 2016-07-30: qty 3

## 2016-07-30 MED ORDER — HYDROMORPHONE HCL 1 MG/ML IJ SOLN
0.5000 mg | INTRAMUSCULAR | Status: DC | PRN
  Administered 2016-07-30: 0.5 mg via INTRAVENOUS
  Filled 2016-07-30: qty 1

## 2016-07-30 MED ORDER — SUCCINYLCHOLINE CHLORIDE 200 MG/10ML IV SOSY
PREFILLED_SYRINGE | INTRAVENOUS | Status: DC | PRN
  Administered 2016-07-30: 120 mg via INTRAVENOUS

## 2016-07-30 MED ORDER — CEFAZOLIN SODIUM-DEXTROSE 2-4 GM/100ML-% IV SOLN
2.0000 g | INTRAVENOUS | Status: AC
  Administered 2016-07-30: 2 g via INTRAVENOUS

## 2016-07-30 MED ORDER — SENNA 8.6 MG PO TABS
2.0000 | ORAL_TABLET | Freq: Every day | ORAL | Status: DC
  Administered 2016-07-30: 17.2 mg via ORAL
  Filled 2016-07-30: qty 2

## 2016-07-30 MED ORDER — METHOCARBAMOL 500 MG PO TABS: 500.0000 mg | ORAL_TABLET | Freq: Four times a day (QID) | ORAL | Status: DC | PRN

## 2016-07-30 MED ORDER — POVIDONE-IODINE 10 % EX SWAB: 2.0000 "application " | Freq: Once | CUTANEOUS | Status: DC

## 2016-07-30 MED ORDER — HYDROGEN PEROXIDE 3 % EX SOLN
CUTANEOUS | Status: AC
  Filled 2016-07-30: qty 473

## 2016-07-30 MED ORDER — FENTANYL CITRATE (PF) 100 MCG/2ML IJ SOLN
INTRAMUSCULAR | Status: DC | PRN
  Administered 2016-07-30: 50 ug via INTRAVENOUS
  Administered 2016-07-30: 100 ug via INTRAVENOUS
  Administered 2016-07-30: 50 ug via INTRAVENOUS

## 2016-07-30 MED ORDER — MEPERIDINE HCL 50 MG/ML IJ SOLN: 6.2500 mg | INTRAMUSCULAR | Status: DC | PRN

## 2016-07-30 MED ORDER — LACTATED RINGERS IV SOLN
INTRAVENOUS | Status: DC
  Administered 2016-07-30 (×3): via INTRAVENOUS

## 2016-07-30 MED ORDER — SUGAMMADEX SODIUM 200 MG/2ML IV SOLN
INTRAVENOUS | Status: DC | PRN
  Administered 2016-07-30: 200 mg via INTRAVENOUS

## 2016-07-30 MED ORDER — ACETAMINOPHEN 10 MG/ML IV SOLN
INTRAVENOUS | Status: AC
  Filled 2016-07-30: qty 100

## 2016-07-30 MED ORDER — METOCLOPRAMIDE HCL 5 MG/ML IJ SOLN
10.0000 mg | Freq: Once | INTRAMUSCULAR | Status: DC | PRN
Start: 2016-07-30 — End: 2016-07-30

## 2016-07-30 MED ORDER — ISOPROPYL ALCOHOL 70 % SOLN
Status: AC
  Filled 2016-07-30: qty 480

## 2016-07-30 MED ORDER — MENTHOL 3 MG MT LOZG: 1.0000 | LOZENGE | OROMUCOSAL | Status: DC | PRN

## 2016-07-30 MED ORDER — GLYCOPYRROLATE 0.2 MG/ML IV SOSY
PREFILLED_SYRINGE | INTRAVENOUS | Status: AC
  Filled 2016-07-30: qty 3

## 2016-07-30 MED ORDER — DIPHENHYDRAMINE HCL 12.5 MG/5ML PO ELIX: 12.5000 mg | ORAL_SOLUTION | ORAL | Status: DC | PRN

## 2016-07-30 MED ORDER — SODIUM CHLORIDE 0.9 % IJ SOLN
INTRAMUSCULAR | Status: DC | PRN
  Administered 2016-07-30: 30 mL

## 2016-07-30 MED ORDER — KETOROLAC TROMETHAMINE 30 MG/ML IJ SOLN
INTRAMUSCULAR | Status: AC
  Filled 2016-07-30: qty 1

## 2016-07-30 MED ORDER — METOCLOPRAMIDE HCL 5 MG/ML IJ SOLN: 5.0000 mg | Freq: Three times a day (TID) | INTRAMUSCULAR | Status: DC | PRN

## 2016-07-30 MED ORDER — FENTANYL CITRATE (PF) 100 MCG/2ML IJ SOLN
INTRAMUSCULAR | Status: AC
  Filled 2016-07-30: qty 2

## 2016-07-30 MED ORDER — LIDOCAINE HCL (CARDIAC) 20 MG/ML IV SOLN
INTRAVENOUS | Status: DC | PRN
  Administered 2016-07-30: 100 mg via INTRAVENOUS

## 2016-07-30 MED ORDER — METOCLOPRAMIDE HCL 5 MG PO TABS: 5.0000 mg | ORAL_TABLET | Freq: Three times a day (TID) | ORAL | Status: DC | PRN

## 2016-07-30 MED ORDER — ACETAMINOPHEN 10 MG/ML IV SOLN
1000.0000 mg | INTRAVENOUS | Status: AC
Start: 2016-07-30 — End: 2016-07-30
  Administered 2016-07-30: 1000 mg via INTRAVENOUS

## 2016-07-30 MED ORDER — ALLOPURINOL 300 MG PO TABS
300.0000 mg | ORAL_TABLET | Freq: Every day | ORAL | Status: DC
  Administered 2016-07-31: 300 mg via ORAL
  Filled 2016-07-30: qty 1

## 2016-07-30 MED ORDER — SODIUM CHLORIDE 0.9 % IV SOLN
INTRAVENOUS | Status: DC
  Administered 2016-07-30 – 2016-07-31 (×2): via INTRAVENOUS

## 2016-07-30 MED ORDER — TRANEXAMIC ACID 1000 MG/10ML IV SOLN
1000.0000 mg | INTRAVENOUS | Status: AC
  Administered 2016-07-30: 1000 mg via INTRAVENOUS
  Filled 2016-07-30: qty 1100

## 2016-07-30 MED ORDER — HYDROMORPHONE HCL 1 MG/ML IJ SOLN
INTRAMUSCULAR | Status: DC | PRN
  Administered 2016-07-30 (×4): 0.5 mg via INTRAVENOUS

## 2016-07-30 MED ORDER — WATER FOR IRRIGATION, STERILE IR SOLN
Status: DC | PRN
  Administered 2016-07-30: 2000 mL

## 2016-07-30 MED ORDER — ONDANSETRON HCL 4 MG/2ML IJ SOLN: 4.0000 mg | Freq: Four times a day (QID) | INTRAMUSCULAR | Status: DC | PRN

## 2016-07-30 MED ORDER — CITALOPRAM HYDROBROMIDE 20 MG PO TABS
40.0000 mg | ORAL_TABLET | Freq: Every day | ORAL | Status: DC
  Administered 2016-07-31: 40 mg via ORAL
  Filled 2016-07-30: qty 2

## 2016-07-30 MED ORDER — ESMOLOL HCL 100 MG/10ML IV SOLN
INTRAVENOUS | Status: DC | PRN
  Administered 2016-07-30: 10 mg via INTRAVENOUS

## 2016-07-30 MED ORDER — KETOROLAC TROMETHAMINE 15 MG/ML IJ SOLN
7.5000 mg | Freq: Four times a day (QID) | INTRAMUSCULAR | Status: DC
  Administered 2016-07-30 – 2016-07-31 (×2): 7.5 mg via INTRAVENOUS
  Filled 2016-07-30 (×2): qty 1

## 2016-07-30 MED ORDER — TRANEXAMIC ACID 1000 MG/10ML IV SOLN
1000.0000 mg | Freq: Once | INTRAVENOUS | Status: AC
  Administered 2016-07-30: 1000 mg via INTRAVENOUS
  Filled 2016-07-30: qty 10

## 2016-07-30 MED ORDER — ONDANSETRON HCL 4 MG PO TABS
4.0000 mg | ORAL_TABLET | Freq: Four times a day (QID) | ORAL | Status: DC | PRN
  Filled 2016-07-30: qty 1

## 2016-07-30 MED ORDER — ESMOLOL HCL 100 MG/10ML IV SOLN
INTRAVENOUS | Status: AC
  Filled 2016-07-30: qty 10

## 2016-07-30 MED ORDER — CHLORHEXIDINE GLUCONATE 4 % EX LIQD: 60.0000 mL | Freq: Once | CUTANEOUS | Status: DC

## 2016-07-30 MED ORDER — DEXAMETHASONE SODIUM PHOSPHATE 10 MG/ML IJ SOLN
INTRAMUSCULAR | Status: DC | PRN
  Administered 2016-07-30: 10 mg via INTRAVENOUS

## 2016-07-30 MED ORDER — ACETAMINOPHEN 650 MG RE SUPP: 650.0000 mg | Freq: Four times a day (QID) | RECTAL | Status: DC | PRN

## 2016-07-30 MED ORDER — 0.9 % SODIUM CHLORIDE (POUR BTL) OPTIME
TOPICAL | Status: DC | PRN
  Administered 2016-07-30: 1000 mL

## 2016-07-30 MED ORDER — PROPOFOL 10 MG/ML IV BOLUS
INTRAVENOUS | Status: DC | PRN
  Administered 2016-07-30: 160 mg via INTRAVENOUS

## 2016-07-30 MED ORDER — SODIUM CHLORIDE 0.9 % IJ SOLN
INTRAMUSCULAR | Status: AC
  Filled 2016-07-30: qty 50

## 2016-07-30 MED ORDER — SODIUM CHLORIDE 0.9 % IV SOLN: INTRAVENOUS | Status: DC

## 2016-07-30 MED ORDER — HYDROMORPHONE HCL 2 MG/ML IJ SOLN
INTRAMUSCULAR | Status: AC
  Filled 2016-07-30: qty 1

## 2016-07-30 MED ORDER — FUROSEMIDE 20 MG PO TABS
20.0000 mg | ORAL_TABLET | Freq: Every day | ORAL | Status: DC
Start: 2016-07-31 — End: 2016-07-31
  Administered 2016-07-31: 20 mg via ORAL
  Filled 2016-07-30: qty 1

## 2016-07-30 MED ORDER — ROCURONIUM BROMIDE 100 MG/10ML IV SOLN
INTRAVENOUS | Status: DC | PRN
  Administered 2016-07-30: 50 mg via INTRAVENOUS

## 2016-07-30 MED ORDER — HYDROCODONE-ACETAMINOPHEN 5-325 MG PO TABS
1.0000 | ORAL_TABLET | ORAL | Status: DC | PRN
  Administered 2016-07-30 (×2): 1 via ORAL
  Administered 2016-07-31 (×3): 2 via ORAL
  Filled 2016-07-30: qty 1
  Filled 2016-07-30: qty 2
  Filled 2016-07-30: qty 1
  Filled 2016-07-30 (×2): qty 2

## 2016-07-30 MED ORDER — ALBUMIN HUMAN 5 % IV SOLN
INTRAVENOUS | Status: DC | PRN
  Administered 2016-07-30: 14:00:00 via INTRAVENOUS

## 2016-07-30 MED ORDER — CEFAZOLIN SODIUM-DEXTROSE 2-4 GM/100ML-% IV SOLN
2.0000 g | Freq: Four times a day (QID) | INTRAVENOUS | Status: AC
  Administered 2016-07-30 – 2016-07-31 (×2): 2 g via INTRAVENOUS
  Filled 2016-07-30 (×2): qty 100

## 2016-07-30 MED ORDER — IRBESARTAN 150 MG PO TABS: 300.0000 mg | ORAL_TABLET | Freq: Every day | ORAL | Status: DC

## 2016-07-30 MED ORDER — BUPIVACAINE HCL (PF) 0.25 % IJ SOLN
INTRAMUSCULAR | Status: AC
  Filled 2016-07-30: qty 30

## 2016-07-30 MED ORDER — DOCUSATE SODIUM 100 MG PO CAPS
100.0000 mg | ORAL_CAPSULE | Freq: Two times a day (BID) | ORAL | Status: DC
  Administered 2016-07-30 – 2016-07-31 (×2): 100 mg via ORAL
  Filled 2016-07-30 (×2): qty 1

## 2016-07-30 MED ORDER — CEFAZOLIN SODIUM-DEXTROSE 2-4 GM/100ML-% IV SOLN
INTRAVENOUS | Status: AC
  Filled 2016-07-30: qty 100

## 2016-07-30 MED ORDER — ONDANSETRON HCL 4 MG/2ML IJ SOLN
INTRAMUSCULAR | Status: DC | PRN
  Administered 2016-07-30: 4 mg via INTRAVENOUS

## 2016-07-30 MED ORDER — ACETAMINOPHEN 325 MG PO TABS: 650.0000 mg | ORAL_TABLET | Freq: Four times a day (QID) | ORAL | Status: DC | PRN

## 2016-07-30 MED ORDER — POLYETHYLENE GLYCOL 3350 17 G PO PACK: 17.0000 g | PACK | Freq: Every day | ORAL | Status: DC | PRN

## 2016-07-30 MED ORDER — KETOROLAC TROMETHAMINE 30 MG/ML IJ SOLN
INTRAMUSCULAR | Status: DC | PRN
  Administered 2016-07-30: 30 mg via INTRAVENOUS

## 2016-07-30 MED ORDER — PROPOFOL 10 MG/ML IV BOLUS
INTRAVENOUS | Status: AC
  Filled 2016-07-30: qty 20

## 2016-07-30 MED ORDER — HYDROGEN PEROXIDE 3 % EX SOLN
CUTANEOUS | Status: DC | PRN
Start: 2016-07-30 — End: 2016-07-30
  Administered 2016-07-30: 1

## 2016-07-30 SURGICAL SUPPLY — 45 items
ADH SKN CLS APL DERMABOND .7 (GAUZE/BANDAGES/DRESSINGS) ×2
BAG DECANTER FOR FLEXI CONT (MISCELLANEOUS) IMPLANT
BAG SPEC THK2 15X12 ZIP CLS (MISCELLANEOUS)
BAG ZIPLOCK 12X15 (MISCELLANEOUS) IMPLANT
CAPT HIP TOTAL 2 ×3 IMPLANT
CHLORAPREP W/TINT 26ML (MISCELLANEOUS) ×3 IMPLANT
CLOTH BEACON ORANGE TIMEOUT ST (SAFETY) ×3 IMPLANT
COVER PERINEAL POST (MISCELLANEOUS) ×3 IMPLANT
DECANTER SPIKE VIAL GLASS SM (MISCELLANEOUS) ×3 IMPLANT
DERMABOND ADVANCED (GAUZE/BANDAGES/DRESSINGS) ×4
DERMABOND ADVANCED .7 DNX12 (GAUZE/BANDAGES/DRESSINGS) ×2 IMPLANT
DRAPE SHEET LG 3/4 BI-LAMINATE (DRAPES) ×6 IMPLANT
DRAPE STERI IOBAN 125X83 (DRAPES) ×3 IMPLANT
DRAPE U-SHAPE 47X51 STRL (DRAPES) ×6 IMPLANT
DRSG AQUACEL AG ADV 3.5X10 (GAUZE/BANDAGES/DRESSINGS) ×3 IMPLANT
ELECT BLADE TIP CTD 4 INCH (ELECTRODE) ×3 IMPLANT
ELECT PENCIL ROCKER SW 15FT (MISCELLANEOUS) ×3 IMPLANT
ELECT REM PT RETURN 15FT ADLT (MISCELLANEOUS) ×3 IMPLANT
GAUZE SPONGE 4X4 12PLY STRL (GAUZE/BANDAGES/DRESSINGS) ×3 IMPLANT
GLOVE BIO SURGEON STRL SZ8.5 (GLOVE) ×6 IMPLANT
GLOVE BIOGEL PI IND STRL 8.5 (GLOVE) ×1 IMPLANT
GLOVE BIOGEL PI INDICATOR 8.5 (GLOVE) ×2
GOWN SPEC L3 XXLG W/TWL (GOWN DISPOSABLE) ×3 IMPLANT
HANDPIECE INTERPULSE COAX TIP (DISPOSABLE) ×3
HOLDER FOLEY CATH W/STRAP (MISCELLANEOUS) ×3 IMPLANT
HOOD PEEL AWAY FLYTE STAYCOOL (MISCELLANEOUS) ×6 IMPLANT
MARKER SKIN DUAL TIP RULER LAB (MISCELLANEOUS) ×3 IMPLANT
NEEDLE SPNL 18GX3.5 QUINCKE PK (NEEDLE) ×3 IMPLANT
PACK ANTERIOR HIP CUSTOM (KITS) ×3 IMPLANT
SAW OSC TIP CART 19.5X105X1.3 (SAW) ×3 IMPLANT
SEALER BIPOLAR AQUA 6.0 (INSTRUMENTS) ×3 IMPLANT
SET HNDPC FAN SPRY TIP SCT (DISPOSABLE) ×1 IMPLANT
SOL PREP POV-IOD 4OZ 10% (MISCELLANEOUS) ×3 IMPLANT
SUT ETHIBOND NAB CT1 #1 30IN (SUTURE) ×6 IMPLANT
SUT MNCRL AB 3-0 PS2 18 (SUTURE) ×3 IMPLANT
SUT MON AB 2-0 CT1 36 (SUTURE) ×6 IMPLANT
SUT STRATAFIX PDO 1 14 VIOLET (SUTURE) ×3
SUT STRATFX PDO 1 14 VIOLET (SUTURE) ×1
SUT VIC AB 2-0 CT1 27 (SUTURE) ×3
SUT VIC AB 2-0 CT1 TAPERPNT 27 (SUTURE) ×1 IMPLANT
SUTURE STRATFX PDO 1 14 VIOLET (SUTURE) ×1 IMPLANT
SYR 50ML LL SCALE MARK (SYRINGE) ×3 IMPLANT
TRAY FOLEY W/METER SILVER 16FR (SET/KITS/TRAYS/PACK) IMPLANT
WATER STERILE IRR 1500ML POUR (IV SOLUTION) ×3 IMPLANT
YANKAUER SUCT BULB TIP 10FT TU (MISCELLANEOUS) ×3 IMPLANT

## 2016-07-30 NOTE — Anesthesia Preprocedure Evaluation (Signed)
Anesthesia Evaluation  Patient identified by MRN, date of birth, ID band Patient awake    Reviewed: Allergy & Precautions, NPO status , Patient's Chart, lab work & pertinent test results, reviewed documented beta blocker date and time   Airway Mallampati: II  TM Distance: >3 FB Neck ROM: Full    Dental  (+) Dental Advisory Given, Poor Dentition, Chipped, Partial Upper   Pulmonary sleep apnea , former smoker,    Pulmonary exam normal breath sounds clear to auscultation       Cardiovascular hypertension, Pt. on medications and Pt. on home beta blockers + Valvular Problems/Murmurs (mild AS by TTE) AS  Rhythm:Regular Rate:Normal + Systolic murmurs [id number]: Study Conclusions  - Left ventricle: Wall thickness was increased in a pattern of mild LVH. Systolic function was normal. The estimated ejection fraction was in the range of 50% to 55%. Mild diffuse hypokinesis. Doppler parameters are consistent with abnormal left ventricular relaxation (grade 1 diastolic dysfunction). - Aortic valve: Moderately calcified annulus. Moderately calcified leaflets. Right coronary and noncoronary cusp mobility was restricted. There was very mild stenosis. Peak velocity (S): 186 cm/s. Mean gradient (S): 7 mm Hg. Valve area (VTI): 2.01 cm^2. Valve area (Vmean): 1.73 cm^2. - Aorta: Aortic root dimension: 37 mm (ED). Ascending aortic diameter: 38 mm (S). - Aortic root: The aortic root was mildly dilated. - Ascending aorta: The ascending aorta was mildly dilated. - Left atrium: The atrium was mildly dilated. - Right ventricle: The cavity size was normal. Wall thickness was normal. Systolic function was normal. - Atrial septum: No defect or patent foramen ovale was identified. - Tricuspid valve: There was mild regurgitation. - Pulmonic valve: There was trivial regurgitation. - Pulmonary arteries: PA peak pressure: 35 mm Hg (S).    Neuro/Psych PSYCHIATRIC  DISORDERS Anxiety Depression Peripheral neuropathy     GI/Hepatic negative GI ROS, Neg liver ROS, GERD  Medicated and Controlled,  Endo/Other  Obesity   Renal/GU negative Renal ROS     Musculoskeletal  (+) Arthritis , Osteoarthritis,    Abdominal   Peds  Hematology  (+) Blood dyscrasia, anemia , Plt 193k   Anesthesia Other Findings Day of surgery medications reviewed with the patient.  Reproductive/Obstetrics                             Anesthesia Physical  Anesthesia Plan  ASA: II  Anesthesia Plan: General   Post-op Pain Management:    Induction: Intravenous  Airway Management Planned: Oral ETT  Additional Equipment:   Intra-op Plan:   Post-operative Plan: Extubation in OR  Informed Consent: I have reviewed the patients History and Physical, chart, labs and discussed the procedure including the risks, benefits and alternatives for the proposed anesthesia with the patient or authorized representative who has indicated his/her understanding and acceptance.   Dental advisory given  Plan Discussed with: CRNA, Anesthesiologist and Surgeon  Anesthesia Plan Comments: (Risks/benefits of general anesthesia discussed with patient including risk of damage to teeth, lips, gum, and tongue, nausea/vomiting, allergic reactions to medications, and the possibility of heart attack, stroke and death.  All patient questions answered.  Patient wishes to proceed.)        Anesthesia Quick Evaluation                                  Anesthesia Evaluation  Patient identified by MRN, date of birth, ID  band Patient awake    Reviewed: Allergy & Precautions, NPO status , Patient's Chart, lab work & pertinent test results  History of Anesthesia Complications Negative for: history of anesthetic complications  Airway Mallampati: II  TM Distance: >3 FB Neck ROM: Full    Dental  (+) Poor Dentition, Chipped, Missing, Dental Advisory Given    Pulmonary sleep apnea (does not use CPAP) , former smoker (quit 1970),    breath sounds clear to auscultation       Cardiovascular hypertension, Pt. on medications and Pt. on home beta blockers (-) angina Rhythm:Regular Rate:Normal  10/16 ECHO: low normal LV function - calcified aortic valve with very mild stenosis    Neuro/Psych Chronic back pain: narcotics    GI/Hepatic Neg liver ROS, GERD  Medicated and Controlled,  Endo/Other  Morbid obesity  Renal/GU negative Renal ROS     Musculoskeletal  (+) Arthritis ,   Abdominal (+) + obese,   Peds  Hematology negative hematology ROS (+)   Anesthesia Other Findings   Reproductive/Obstetrics                           Anesthesia Physical Anesthesia Plan  ASA: III  Anesthesia Plan: General   Post-op Pain Management:    Induction: Intravenous  Airway Management Planned: Oral ETT  Additional Equipment:   Intra-op Plan:   Post-operative Plan: Extubation in OR  Informed Consent: I have reviewed the patients History and Physical, chart, labs and discussed the procedure including the risks, benefits and alternatives for the proposed anesthesia with the patient or authorized representative who has indicated his/her understanding and acceptance.   Dental advisory given  Plan Discussed with: CRNA and Surgeon  Anesthesia Plan Comments: (Plan routine monitors, GETA)       Anesthesia Quick Evaluation                                   Anesthesia Evaluation  Patient identified by MRN, date of birth, ID band Patient awake    Reviewed: Allergy & Precautions, NPO status , Patient's Chart, lab work & pertinent test results  History of Anesthesia Complications Negative for: history of anesthetic complications  Airway Mallampati: II  TM Distance: >3 FB Neck ROM: Full    Dental  (+) Poor Dentition, Chipped, Missing, Dental Advisory Given   Pulmonary sleep apnea (does not use  CPAP) , former smoker (quit 1970),    breath sounds clear to auscultation       Cardiovascular hypertension, Pt. on medications and Pt. on home beta blockers (-) angina Rhythm:Regular Rate:Normal  10/16 ECHO: low normal LV function - calcified aortic valve with very mild stenosis    Neuro/Psych Chronic back pain: narcotics    GI/Hepatic Neg liver ROS, GERD  Medicated and Controlled,  Endo/Other  Morbid obesity  Renal/GU negative Renal ROS     Musculoskeletal  (+) Arthritis ,   Abdominal (+) + obese,   Peds  Hematology negative hematology ROS (+)   Anesthesia Other Findings   Reproductive/Obstetrics                           Anesthesia Physical Anesthesia Plan  ASA: III  Anesthesia Plan: General   Post-op Pain Management:    Induction: Intravenous  Airway Management Planned: Oral ETT  Additional Equipment:   Intra-op Plan:   Post-operative  Plan: Extubation in OR  Informed Consent: I have reviewed the patients History and Physical, chart, labs and discussed the procedure including the risks, benefits and alternatives for the proposed anesthesia with the patient or authorized representative who has indicated his/her understanding and acceptance.   Dental advisory given  Plan Discussed with: CRNA and Surgeon  Anesthesia Plan Comments: (Plan routine monitors, GETA)       Anesthesia Quick Evaluation

## 2016-07-30 NOTE — Op Note (Signed)
OPERATIVE REPORT  SURGEON: Rod Can, MD   ASSISTANT: None.  PREOPERATIVE DIAGNOSIS: Left hip arthritis.   POSTOPERATIVE DIAGNOSIS: Left hip arthritis.   PROCEDURE: Left total hip arthroplasty, anterior approach.   IMPLANTS: DePuy Tri Lock stem, size 6, std offset. DePuy Pinnacle Cup, size 54 mm. DePuy Altrx liner, size 36 by 54 mm, +4 neutral. DePuy Biolox ceramic head ball, size 36 + 1.5 mm.  ANESTHESIA:  General  ESTIMATED BLOOD LOSS: 450 mL.  ANTIBIOTICS: 2 g Ancef.  DRAINS: None.  COMPLICATIONS: None.   CONDITION: PACU - hemodynamically stable.  BRIEF CLINICAL NOTE: MARQUISE CARITHERS is a 76 y.o. male with a long-standing history of Left hip arthritis. After failing conservative management, the patient was indicated for total hip arthroplasty. The risks, benefits, and alternatives to the procedure were explained, and the patient elected to proceed.  PROCEDURE IN DETAIL: Surgical site was marked by myself. Spinal anesthesia was obtained in the pre-op holding area. Once inside the operative room, a foley catheter was inserted. The patient was then positioned on the Hana table. All bony prominences were well padded. The hip was prepped and draped in the normal sterile surgical fashion. A time-out was called verifying side and site of surgery. The patient received IV antibiotics within 60 minutes of beginning the procedure.  The direct anterior approach to the hip was performed through the Hueter interval. Lateral femoral circumflex vessels were treated with the Auqumantys. The anterior capsule was exposed and an inverted T capsulotomy was made.The femoral neck cut was made to the level of the templated cut. A corkscrew was placed into the head and the head was removed. The femoral head was found to have eburnated bone. The head was passed to the back table and was measured.  Acetabular exposure was achieved, and the pulvinar and labrum were excised.  Sequental reaming of the acetabulum was then performed up to a size 53 mm reamer. A 54 mm cup was then opened and impacted into place at approximately 40 degrees of abduction and 20 degrees of anteversion. The final polyethylene liner was impacted into place and acetabular osteophytes were removed.   I then gained femoral exposure taking care to protect the abductors and greater trochanter. This was performed using standard external rotation, extension, and adduction. The capsule was peeled off the inner aspect of the greater trochanter, taking care to preserve the short external rotators. A cookie cutter was used to enter the femoral canal, and then the femoral canal finder was placed. Sequential broaching was performed up to a size 5. Calcar planer was used on the femoral neck remnant. I placed a std offset neck and a trial head ball. The hip was reduced. Leg lengths and offset were checked fluoroscopically. The hip was dislocated and trial components were removed. The final implants were placed, and the hip was reduced.  Fluoroscopy was used to confirm component position and leg lengths. At 90 degrees of external rotation and full extension, the hip was stable to an anterior directed force.  The wound was copiously irrigated with a dilute betadine solution followed by normal saline. Marcaine solution was injected into the periarticular soft tissue. The wound was closed in layers using #1 Vicryl and V-Loc for the fascia, 2-0 Vicryl for the subcutaneous fat, 2-0 Monocryl for the deep dermal layer, 3-0 running Monocryl subcuticular stitch, and Dermabond for the skin. Once the glue was fully dried, an Aquacell Ag dressing was applied. The patient was transported to the recovery room in  stable condition. Sponge, needle, and instrument counts were correct at the end of the case x2. The patient tolerated the procedure well and there were no known complications.

## 2016-07-30 NOTE — Discharge Summary (Signed)
Physician Discharge Summary  Patient ID: John Hanson MRN: [id number] DOB/AGE: [birth year] 75 y.o.  Admit date: 07/30/2016 Discharge date: 07/31/2016  Admission Diagnoses:  Primary osteoarthritis of left hip  Discharge Diagnoses:  Principal Problem:   Primary osteoarthritis of left hip   Past Medical History:  Diagnosis Date  . Anxiety   . Aortic stenosis    a. 06/2015 Echo: EF 50-55%, mild diff HK, Gr1 DD, mild AS w/ restricted mobility of R coronary and non-coronary cusp, mildly dil Ao root (29mm), mildly dil LA, nl RV fxn, mild TR/PR, PASP 64mmHg.  . Arthritis   . Depression   . Essential hypertension   . GERD (gastroesophageal reflux disease)   . Numbness and tingling    fingertips bilat   . Osteoarthritis    a. R>L hip pending R THA.  . Sleep apnea    a. pt states does not use CPAP machine   . Varicose veins     Surgeries: Procedure(s): TOTAL HIP ARTHROPLASTY ANTERIOR APPROACH on 07/30/2016   Consultants (if any):   Discharged Condition: Improved  Hospital Course: John Hanson is an 75 y.o. male who was admitted 07/30/2016 with a diagnosis of Primary osteoarthritis of left hip and went to the operating room on 07/30/2016 and underwent the above named procedures.    He was given perioperative antibiotics:  Anti-infectives    Start     Dose/Rate Route Frequency Ordered Stop   07/30/16 1830  ceFAZolin (ANCEF) IVPB 2g/100 mL premix     2 g 200 mL/hr over 30 Minutes Intravenous Every 6 hours 07/30/16 1720 07/31/16 0212   07/30/16 0938  ceFAZolin (ANCEF) IVPB 2g/100 mL premix     2 g 200 mL/hr over 30 Minutes Intravenous On call to O.R. 07/30/16 [id number] 07/30/16 1312    .  He was given sequential compression devices, early ambulation, and ASA for DVT prophylaxis.  He benefited maximally from the hospital stay and there were no complications.    Recent vital signs:  Vitals:   07/31/16 0607 07/31/16 1055  BP: 123/70 (!) 149/72  Pulse: 71 73  Resp: 16  16  Temp: 98.1 F (36.7 C) 97.9 F (36.6 C)    Recent laboratory studies:  Lab Results  Component Value Date   HGB 9.6 (L) 07/31/2016   HGB 11.9 (L) 07/20/2016   HGB 8.4 (L) 03/28/2016   Lab Results  Component Value Date   WBC 8.2 07/31/2016   PLT 189 07/31/2016   No results found for: INR Lab Results  Component Value Date   NA 133 (L) 07/31/2016   K 4.3 07/31/2016   CL 104 07/31/2016   CO2 25 07/31/2016   BUN 19 07/31/2016   CREATININE 1.30 (H) 07/31/2016   GLUCOSE 135 (H) 07/31/2016    Discharge Medications:     Medication List    STOP taking these medications   cefadroxil 500 MG capsule Commonly known as:  DURICEF     TAKE these medications   allopurinol 300 MG tablet Commonly known as:  ZYLOPRIM Take 300 mg by mouth daily.   amLODipine 10 MG tablet Commonly known as:  NORVASC Take 10 mg by mouth daily.   aspirin 325 MG EC tablet Take 1 tablet (325 mg total) by mouth 2 (two) times daily with a meal. What changed:  when to take this  Another medication with the same name was removed. Continue taking this medication, and follow the directions you see here.   citalopram 40 MG  tablet Commonly known as:  CELEXA Take 40 mg by mouth daily.   docusate sodium 100 MG capsule Commonly known as:  COLACE Take 1 capsule (100 mg total) by mouth 2 (two) times daily.   furosemide 20 MG tablet Commonly known as:  LASIX Take 1 tablet (20 mg total) by mouth daily.   HYDROcodone-acetaminophen 5-325 MG tablet Commonly known as:  NORCO/VICODIN Take 1-2 tablets by mouth every 4 (four) hours as needed (breakthrough pain). What changed:  reasons to take this   labetalol 300 MG tablet Commonly known as:  NORMODYNE Take 300 mg by mouth 3 (three) times daily.   naproxen sodium 220 MG tablet Commonly known as:  ANAPROX Take 220-440 mg by mouth 2 (two) times daily as needed (for pain.).   omeprazole 40 MG capsule Commonly known as:  PRILOSEC Take 40 mg by mouth  daily.   ondansetron 4 MG tablet Commonly known as:  ZOFRAN Take 1 tablet (4 mg total) by mouth every 6 (six) hours as needed for nausea.   senna 8.6 MG Tabs tablet Commonly known as:  SENOKOT Take 2 tablets (17.2 mg total) by mouth at bedtime.   traZODone 150 MG tablet Commonly known as:  DESYREL Take 150 mg by mouth at bedtime.   valsartan 320 MG tablet Commonly known as:  DIOVAN Take 320 mg by mouth daily.       Diagnostic Studies: Dg Pelvis Portable  Result Date: 07/30/2016 CLINICAL DATA:  Status post left hip replacement - images obtained in PACU EXAM: PORTABLE PELVIS 1-2 VIEWS COMPARISON:  03/26/2016 FINDINGS: The patient has undergone left hip arthroplasty. The femoral head component appears well seated in the acetabular component. No interval fractures. Gas identified within the soft tissues of the left hip. IMPRESSION: Interval left hip arthroplasty.  No adverse features identified. Electronically Signed   By: Nolon Nations M.D.   On: 07/30/2016 16:19   Dg C-arm 61-120 Min-no Report  Result Date: 07/30/2016 CLINICAL DATA: hip C-ARM 61-120 MINUTES Fluoroscopy was utilized by the requesting physician.  No radiographic interpretation.   Dg Hip Operative Unilat With Pelvis Left  Result Date: 07/30/2016 CLINICAL DATA:  Left hip replacement EXAM: OPERATIVE left HIP (WITH PELVIS IF PERFORMED) 2 VIEWS TECHNIQUE: Fluoroscopic spot image(s) were submitted for interpretation post-operatively. COMPARISON:  03/26/2016 FINDINGS: There is a left total hip replacement with satisfactory alignment. A previous right hip replacement is also present. IMPRESSION: Intraoperative fluoroscopic assistance for left hip replacement. Electronically Signed   By: Donavan Foil M.D.   On: 07/30/2016 15:23    Disposition: 01-Home or Self Care  Discharge Instructions    Call MD / Call 911    Complete by:  As directed    If you experience chest pain or shortness of breath, CALL 911 and be transported  to the hospital emergency room.  If you develope a fever above 101 F, pus (white drainage) or increased drainage or redness at the wound, or calf pain, call your surgeon's office.   Constipation Prevention    Complete by:  As directed    Drink plenty of fluids.  Prune juice may be helpful.  You may use a stool softener, such as Colace (over the counter) 100 mg twice a day.  Use MiraLax (over the counter) for constipation as needed.   Diet - low sodium heart healthy    Complete by:  As directed    Driving restrictions    Complete by:  As directed    No driving  for 6 weeks   Increase activity slowly as tolerated    Complete by:  As directed    Lifting restrictions    Complete by:  As directed    No lifting for 6 weeks   TED hose    Complete by:  As directed    Use stockings (TED hose) for 2 weeks on both leg(s).  You may remove them at night for sleeping.      Follow-up Information    Lexxi Koslow, Horald Pollen, MD. Schedule an appointment as soon as possible for a visit in 2 week(s).   Specialty:  Orthopedic Surgery Contact information: [address] [place] [address] [phone]            Signed: Elie Goody 07/31/2016, 8:34 PM

## 2016-07-30 NOTE — Anesthesia Procedure Notes (Signed)
Spinal

## 2016-07-30 NOTE — H&P (View-Only) (Signed)
TOTAL HIP ADMISSION H&P  Patient is admitted for left total hip arthroplasty.  Subjective:  Chief Complaint: left hip pain  HPI: John Hanson, 75 y.o. male, has a history of pain and functional disability in the left hip(s) due to arthritis and patient has failed non-surgical conservative treatments for greater than 12 weeks to include NSAID's and/or analgesics, flexibility and strengthening excercises, supervised PT with diminished ADL's post treatment, use of assistive devices, weight reduction as appropriate and activity modification.  Onset of symptoms was gradual starting >10 years ago with gradually worsening course since that time.The patient noted no past surgery on the left hip(s).  Patient currently rates pain in the left hip at 10 out of 10 with activity. Patient has night pain, worsening of pain with activity and weight bearing, trendelenberg gait, pain that interfers with activities of daily living, pain with passive range of motion and crepitus. Patient has evidence of subchondral cysts, subchondral sclerosis, periarticular osteophytes and joint space narrowing by imaging studies. This condition presents safety issues increasing the risk of falls. There is no current active infection.  Patient Active Problem List   Diagnosis Date Noted  . Primary osteoarthritis of right hip 03/26/2016  . Essential hypertension   . Aortic stenosis   . Osteoarthritis   . Spinal stenosis of lumbar region 09/04/2015  . HTN (hypertension) 07/18/2015  . Murmur 07/18/2015  . Preoperative cardiovascular examination 07/18/2015  . Varicose veins of lower extremities with other complications [id number]  . Pain in limb 02/08/2014   Past Medical History:  Diagnosis Date  . Anxiety   . Aortic stenosis    a. 06/2015 Echo: EF 50-55%, mild diff HK, Gr1 DD, mild AS w/ restricted mobility of R coronary and non-coronary cusp, mildly dil Ao root (78mm), mildly dil LA, nl RV fxn, mild TR/PR, PASP 27mmHg.   . Arthritis   . Depression   . Essential hypertension   . GERD (gastroesophageal reflux disease)   . Numbness and tingling    fingertips bilat   . Osteoarthritis    a. R>L hip pending R THA.  . Sleep apnea    a. pt states does not use CPAP machine   . Varicose veins     Past Surgical History:  Procedure Laterality Date  . ENDOVENOUS ABLATION SAPHENOUS VEIN W/ LASER Right 05-31-2014   EVLA  right gretaer saphenous vein by Curt Jews MD  . LUMBAR LAMINECTOMY/DECOMPRESSION MICRODISCECTOMY N/A 09/04/2015   Procedure: MICRO LUMBAR DECOMPRESSION L4 - L5,  L3 - L4 2 LEVELS;  Surgeon: Susa Day, MD;  Location: [facility];  Service: Orthopedics;  Laterality: N/A;  . TOTAL HIP ARTHROPLASTY Right 03/26/2016   Procedure: RIGHT TOTAL HIP ARTHROPLASTY ANTERIOR APPROACH;  Surgeon: Rod Can, MD;  Location: [facility];  Service: Orthopedics;  Laterality: Right;     (Not in a hospital admission) No Known Allergies  Social History  Substance Use Topics  . Smoking status: Former Smoker    Years: 5.00    Types: Cigars    Quit date: 09/28/1968  . Smokeless tobacco: Never Used  . Alcohol use No    Family History  Problem Relation Age of Onset  . Kidney disease Mother      Review of Systems  Constitutional: Negative.   HENT: Negative.   Eyes: Negative.   Respiratory: Negative.   Cardiovascular: Negative.   Gastrointestinal: Negative.   Genitourinary: Negative.   Musculoskeletal: Positive for back pain and joint pain.  Skin: Negative.   Neurological: Negative.  Endo/Heme/Allergies: Negative.   Psychiatric/Behavioral: Negative.     Objective:  Physical Exam  Vitals reviewed. Constitutional: He is oriented to person, place, and time. He appears well-developed and well-nourished.  HENT:  Head: Normocephalic and atraumatic.  Eyes: Conjunctivae and EOM are normal. Pupils are equal, round, and reactive to light.  Neck: Normal range of motion. Neck supple.  Cardiovascular: Normal rate  and intact distal pulses.   Respiratory: Effort normal. No respiratory distress.  GI: Soft. Bowel sounds are normal. He exhibits no distension.  Genitourinary:  Genitourinary Comments: deferred  Musculoskeletal:       Left hip: He exhibits decreased range of motion and crepitus.  Neurological: He is alert and oriented to person, place, and time. He has normal reflexes.  Skin: Skin is warm and dry.  Psychiatric: He has a normal mood and affect. His behavior is normal. Judgment and thought content normal.    Vital signs in last 24 hours: @VSRANGES @  Labs:   Estimated body mass index is 37.16 kg/m as calculated from the following:   Height as of 03/26/16: 5\' 8"  (1.727 m).   Weight as of 03/26/16: 110.9 kg (244 lb 6.4 oz).   Imaging Review Plain radiographs demonstrate severe degenerative joint disease of the left hip(s). The bone quality appears to be adequate for age and reported activity level.  Assessment/Plan:  End stage arthritis, left hip(s)  The patient history, physical examination, clinical judgement of the provider and imaging studies are consistent with end stage degenerative joint disease of the left hip(s) and total hip arthroplasty is deemed medically necessary. The treatment options including medical management, injection therapy, arthroscopy and arthroplasty were discussed at length. The risks and benefits of total hip arthroplasty were presented and reviewed. The risks due to aseptic loosening, infection, stiffness, dislocation/subluxation,  thromboembolic complications and other imponderables were discussed.  The patient acknowledged the explanation, agreed to proceed with the plan and consent was signed. Patient is being admitted for inpatient treatment for surgery, pain control, PT, OT, prophylactic antibiotics, VTE prophylaxis, progressive ambulation and ADL's and discharge planning.The patient is planning to be discharged home with home health services

## 2016-07-30 NOTE — Interval H&P Note (Signed)
History and Physical Interval Note:  07/30/2016 12:20 PM  John Hanson  has presented today for surgery, with the diagnosis of DEGENERATIVE JOINT LEFT HIP  The various methods of treatment have been discussed with the patient and family. After consideration of risks, benefits and other options for treatment, the patient has consented to  Procedure(s): TOTAL HIP ARTHROPLASTY ANTERIOR APPROACH (Left) as a surgical intervention .  The patient's history has been reviewed, patient examined, no change in status, stable for surgery.  I have reviewed the patient's chart and labs.  Questions were answered to the patient's satisfaction.     Evania Lyne, Horald Pollen

## 2016-07-30 NOTE — Transfer of Care (Signed)
Immediate Anesthesia Transfer of Care Note  Patient: John Hanson  Procedure(s) Performed: Procedure(s): TOTAL HIP ARTHROPLASTY ANTERIOR APPROACH (Left)  Patient Location: PACU  Anesthesia Type:General  Level of Consciousness:  sedated, patient cooperative and responds to stimulation  Airway & Oxygen Therapy:Patient Spontanous Breathing and Patient connected to face mask oxgen  Post-op Assessment:  Report given to PACU RN and Post -op Vital signs reviewed and stable  Post vital signs:  Reviewed and stable  Last Vitals:  Vitals:   07/30/16 0938  BP: (!) 152/85  Pulse: 72  Resp: 18  Temp: 123XX123 C    Complications: No apparent anesthesia complications

## 2016-07-30 NOTE — Anesthesia Postprocedure Evaluation (Signed)
Anesthesia Post Note  Patient: John Hanson  Procedure(s) Performed: Procedure(s) (LRB): TOTAL HIP ARTHROPLASTY ANTERIOR APPROACH (Left)  Patient location during evaluation: PACU Anesthesia Type: General Level of consciousness: awake and alert Pain management: pain level controlled Vital Signs Assessment: post-procedure vital signs reviewed and stable Respiratory status: spontaneous breathing, nonlabored ventilation, respiratory function stable and patient connected to nasal cannula oxygen Cardiovascular status: blood pressure returned to baseline and stable Postop Assessment: no signs of nausea or vomiting Anesthetic complications: no    Last Vitals:  Vitals:   07/30/16 1705 07/30/16 1714  BP:  118/75  Pulse: 65 65  Resp: 12 14  Temp: 36.9 C 36.6 C    Last Pain:  Vitals:   07/30/16 1720  TempSrc:   PainSc: 3     LLE Motor Response: Purposeful movement (07/30/16 1722) LLE Sensation: Full sensation (07/30/16 1722) RLE Motor Response: Purposeful movement (07/30/16 1722) RLE Sensation: Full sensation (07/30/16 1722)      Montez Hageman

## 2016-07-30 NOTE — Anesthesia Procedure Notes (Signed)
Procedure Name: Intubation Date/Time: 07/30/2016 1:31 PM Performed by: Dreux Mcgroarty, Virgel Gess Pre-anesthesia Checklist: Patient identified, Emergency Drugs available, Suction available, Patient being monitored and Timeout performed Patient Re-evaluated:Patient Re-evaluated prior to inductionOxygen Delivery Method: Circle system utilized Preoxygenation: Pre-oxygenation with 100% oxygen Intubation Type: IV induction Ventilation: Mask ventilation without difficulty Laryngoscope Size: Mac and 4 Grade View: Grade I Tube type: Oral Tube size: 7.5 mm Number of attempts: 1 Airway Equipment and Method: Stylet Placement Confirmation: ETT inserted through vocal cords under direct vision,  positive ETCO2,  CO2 detector and breath sounds checked- equal and bilateral Secured at: 22 cm Tube secured with: Tape Dental Injury: Teeth and Oropharynx as per pre-operative assessment

## 2016-07-30 NOTE — Discharge Instructions (Signed)
° °Dr. Akacia Boltz °Total Joint Specialist °[facility] °[address] °[place], [place] [address] °[phone] ° °TOTAL KNEE REPLACEMENT POSTOPERATIVE DIRECTIONS ° ° ° °Knee Rehabilitation, Guidelines Following Surgery  °Results after knee surgery are often greatly improved when you follow the exercise, range of motion and muscle strengthening exercises prescribed by your doctor. Safety measures are also important to protect the knee from further injury. Any time any of these exercises cause you to have increased pain or swelling in your knee joint, decrease the amount until you are comfortable again and slowly increase them. If you have problems or questions, call your caregiver or physical therapist for advice.  ° °WEIGHT BEARING °Weight bearing as tolerated with assist device (walker, cane, etc) as directed, use it as long as suggested by your surgeon or therapist, typically at least 4-6 weeks. ° °HOME CARE INSTRUCTIONS  °Remove items at home which could result in a fall. This includes throw rugs or furniture in walking pathways.  °Continue medications as instructed at time of discharge. °You may have some home medications which will be placed on hold until you complete the course of blood thinner medication.  °You may start showering once you are discharged home but do not submerge the incision under water. Just pat the incision dry and apply a dry gauze dressing on daily. °Walk with walker as instructed.  °You may resume a sexual relationship in one month or when given the OK by your doctor.  °· Use walker as long as suggested by your caregivers. °· Avoid periods of inactivity such as sitting longer than an hour when not asleep. This helps prevent blood clots.  °You may put full weight on your legs and walk as much as is comfortable.  °You may return to work once you are cleared by your doctor.  °Do not drive a car for 6 weeks or until released by you surgeon.  °· Do not drive  while taking narcotics.  °Wear the elastic stockings for three weeks following surgery during the day but you may remove then at night. °Make sure you keep all of your appointments after your operation with all of your doctors and caregivers. You should call the office at the above phone number and make an appointment for approximately two weeks after the date of your surgery. °Do not remove your surgical dressing. The dressing is waterproof; you may take showers in 3 days, but do not take tub baths or submerge the dressing. °Please pick up a stool softener and laxative for home use as long as you are requiring pain medications. °· ICE to the affected knee every three hours for 30 minutes at a time and then as needed for pain and swelling.  Continue to use ice on the knee for pain and swelling from surgery. You may notice swelling that will progress down to the foot and ankle.  This is normal after surgery.  Elevate the leg when you are not up walking on it.   °It is important for you to complete the blood thinner medication as prescribed by your doctor. °· Continue to use the breathing machine which will help keep your temperature down.  It is common for your temperature to cycle up and down following surgery, especially at night when you are not up moving around and exerting yourself.  The breathing machine keeps your lungs expanded and your temperature down. ° °RANGE OF MOTION AND STRENGTHENING EXERCISES  °Rehabilitation of the knee is important following   a knee injury or an operation. After just a few days of immobilization, the muscles of the thigh which control the knee become weakened and shrink (atrophy). Knee exercises are designed to build up the tone and strength of the thigh muscles and to improve knee motion. Often times heat used for twenty to thirty minutes before working out will loosen up your tissues and help with improving the range of motion but do not use heat for the first two weeks following  surgery. These exercises can be done on a training (exercise) mat, on the floor, on a table or on a bed. Use what ever works the best and is most comfortable for you Knee exercises include:  °Leg Lifts - While your knee is still immobilized in a splint or cast, you can do straight leg raises. Lift the leg to 60 degrees, hold for 3 sec, and slowly lower the leg. Repeat 10-20 times 2-3 times daily. Perform this exercise against resistance later as your knee gets better.  °Quad and Hamstring Sets - Tighten up the muscle on the front of the thigh (Quad) and hold for 5-10 sec. Repeat this 10-20 times hourly. Hamstring sets are done by pushing the foot backward against an object and holding for 5-10 sec. Repeat as with quad sets.  °A rehabilitation program following serious knee injuries can speed recovery and prevent re-injury in the future due to weakened muscles. Contact your doctor or a physical therapist for more information on knee rehabilitation.  ° °SKILLED REHAB INSTRUCTIONS: °If the patient is transferred to a skilled rehab facility following release from the hospital, a list of the current medications will be sent to the facility for the patient to continue.  When discharged from the skilled rehab facility, please have the facility set up the patient's Home Health Physical Therapy prior to being released. Also, the skilled facility will be responsible for providing the patient with their medications at time of release from the facility to include their pain medication, the muscle relaxants, and their blood thinner medication. If the patient is still at the rehab facility at time of the two week follow up appointment, the skilled rehab facility will also need to assist the patient in arranging follow up appointment in our office and any transportation needs. ° °MAKE SURE YOU:  °Understand these instructions.  °Will watch your condition.  °Will get help right away if you are not doing well or get worse.   ° ° °Pick up stool softner and laxative for home use following surgery while on pain medications. °Do NOT remove your dressing. You may shower.  °Do not take tub baths or submerge incision under water. °May shower starting three days after surgery. °Please use a clean towel to pat the incision dry following showers. °Continue to use ice for pain and swelling after surgery. °Do not use any lotions or creams on the incision until instructed by your surgeon. ° °

## 2016-07-31 ENCOUNTER — Encounter (HOSPITAL_COMMUNITY): Payer: Self-pay | Admitting: Orthopedic Surgery

## 2016-07-31 LAB — BASIC METABOLIC PANEL
Anion gap: 4 — ABNORMAL LOW (ref 5–15)
BUN: 19 mg/dL (ref 6–20)
CALCIUM: 8.3 mg/dL — AB (ref 8.9–10.3)
CO2: 25 mmol/L (ref 22–32)
CREATININE: 1.3 mg/dL — AB (ref 0.61–1.24)
Chloride: 104 mmol/L (ref 101–111)
GFR calc Af Amer: >60 mL/min (ref >60)
GFR, EST NON AFRICAN AMERICAN: 52 mL/min — AB (ref >60)
GLUCOSE: 135 mg/dL — AB (ref 65–99)
Potassium: 4.3 mmol/L (ref 3.5–5.1)
SODIUM: 133 mmol/L — AB (ref 135–145)

## 2016-07-31 LAB — CBC
HCT: 28.4 % — ABNORMAL LOW (ref 39.0–52.0)
Hemoglobin: 9.6 g/dL — ABNORMAL LOW (ref 13.0–17.0)
MCH: 32.1 pg (ref 26.0–34.0)
MCHC: 33.8 g/dL (ref 30.0–36.0)
MCV: 95 fL (ref 78.0–100.0)
PLATELETS: 189 10*3/uL (ref 150–400)
RBC: 2.99 MIL/uL — ABNORMAL LOW (ref 4.22–5.81)
RDW: 14.4 % (ref 11.5–15.5)
WBC: 8.2 10*3/uL (ref 4.0–10.5)

## 2016-07-31 MED ORDER — ASPIRIN 325 MG PO TBEC: 325.0000 mg | DELAYED_RELEASE_TABLET | Freq: Two times a day (BID) | ORAL | 1 refills | Status: DC

## 2016-07-31 MED ORDER — HYDROCODONE-ACETAMINOPHEN 5-325 MG PO TABS: 1.0000 | ORAL_TABLET | ORAL | 0 refills | Status: DC | PRN

## 2016-07-31 NOTE — Progress Notes (Signed)
   Subjective:  Patient reports pain as mild to moderate.  Denies N/V/CP/SOB.  Objective:   VITALS:   Vitals:   07/30/16 2013 07/30/16 2303 07/31/16 0202 07/31/16 0607  BP: 133/87 140/81 137/69 123/70  Pulse: 65 69 71 71  Resp: 16  16 16   Temp: 97.8 F (36.6 C)  97.8 F (36.6 C) 98.1 F (36.7 C)  TempSrc: Oral  Oral Oral  SpO2: 100%  99% 100%  Weight:      Height:        NAD Neurologically intact ABD soft Sensation intact distally Intact pulses distally Dorsiflexion/Plantar flexion intact Incision: dressing C/D/I Compartment soft    Lab Results  Component Value Date   WBC 8.2 07/31/2016   HGB 9.6 (L) 07/31/2016   HCT 28.4 (L) 07/31/2016   MCV 95.0 07/31/2016   PLT 189 07/31/2016   BMET    Component Value Date/Time   NA 133 (L) 07/31/2016 0418   K 4.3 07/31/2016 0418   CL 104 07/31/2016 0418   CO2 25 07/31/2016 0418   GLUCOSE 135 (H) 07/31/2016 0418   BUN 19 07/31/2016 0418   CREATININE 1.30 (H) 07/31/2016 0418   CALCIUM 8.3 (L) 07/31/2016 0418   GFRNONAA 52 (L) 07/31/2016 0418   GFRAA >60 07/31/2016 0418     Assessment/Plan: 1 Day Post-Op   Principal Problem:   Primary osteoarthritis of left hip   WBAT with walker DVT ppx: ASA, SCDs, TEDs PO pain control PT/OT Dispo: D/C home with HHPT   Jeson Camacho, Horald Pollen 07/31/2016, 6:54 AM   Rod Can, MD Cell [phone]

## 2016-07-31 NOTE — Progress Notes (Signed)
OT Cancellation Note  Patient Details Name: John Hanson MRN: [id number] DOB: 20-Apr-1941   Cancelled Treatment:    Reason Eval/Treat Not Completed: Other (comment).  Pt recently had other hip done.  He is now interested in tub transfer bench:  Brought this by to show him, and he would like [place] to order it for him (and tell him the cost)  [place] 07/31/2016, 9:16 AM  Lesle Chris, OTR/L [phone] 07/31/2016

## 2016-07-31 NOTE — Evaluation (Signed)
Physical Therapy Evaluation Patient Details Name: John Hanson MRN: [id number] DOB: [birth year] Today's Date: 07/31/2016   History of Present Illness  Pt s/p L THR and with hx of R THR 6/17  Clinical Impression  Pt s/p L THR presents with decreased L LE strength/ROM and post op pain limiting functional mobility.  Pt should progress to dc home with family assist and HHPT follow up.    Follow Up Recommendations Home health PT    Equipment Recommendations  None recommended by PT    Recommendations for Other Services OT consult     Precautions / Restrictions Precautions Precautions: Fall Restrictions Weight Bearing Restrictions: No Other Position/Activity Restrictions: WBAT      Mobility  Bed Mobility Overal bed mobility: Needs Assistance Bed Mobility: Supine to Sit     Supine to sit: Min guard     General bed mobility comments: Increased time and min cues for sequence  Transfers Overall transfer level: Needs assistance Equipment used: Rolling walker (2 wheeled) Transfers: Sit to/from Stand Sit to Stand: Min guard         General transfer comment: cues for LE management and use of UEs to self assist  Ambulation/Gait Ambulation/Gait assistance: Min assist;Min guard Ambulation Distance (Feet): 180 Feet Assistive device: Rolling walker (2 wheeled) Gait Pattern/deviations: Step-to pattern;Step-through pattern;Decreased step length - right;Decreased step length - left;Shuffle;Trunk flexed Gait velocity: decr Gait velocity interpretation: Below normal speed for age/gender General Gait Details: cues for posture, position from RW and initial sequence  Stairs            Wheelchair Mobility    Modified Rankin (Stroke Patients Only)       Balance Overall balance assessment: No apparent balance deficits (not formally assessed)                                           Pertinent Vitals/Pain Pain Assessment: 0-10 Pain Score: 5   Pain Location: L hip Pain Descriptors / Indicators: Aching;Sore Pain Intervention(s): Limited activity within patient's tolerance;Monitored during session;Premedicated before session;Ice applied    Home Living Family/patient expects to be discharged to:: Private residence Living Arrangements: Alone Available Help at Discharge: Family;Available 24 hours/day Type of Home: House Home Access: Level entry     Home Layout: One level Home Equipment: Cane - single point;Grab bars - tub/shower;Walker - 2 wheels      Prior Function Level of Independence: Independent with assistive device(s)         Comments: used cane in house and community     Hand Dominance        Extremity/Trunk Assessment   Upper Extremity Assessment: Overall WFL for tasks assessed           Lower Extremity Assessment: RLE deficits/detail;LLE deficits/detail RLE Deficits / Details: Strength WFL with AROM ltd to ~ 90 hip flex LLE Deficits / Details: Strength at hip 3-/5 with AAROM at hip to 85 flex and 15 abd  Cervical / Trunk Assessment: Normal  Communication   Communication: No difficulties  Cognition Arousal/Alertness: Awake/alert Behavior During Therapy: WFL for tasks assessed/performed Overall Cognitive Status: Within Functional Limits for tasks assessed                      General Comments      Exercises Total Joint Exercises Ankle Circles/Pumps: AROM;Both;20 reps;Supine Quad Sets: AROM;Both;10 reps;Supine Heel Slides: AAROM;Left;20  reps;Supine Hip ABduction/ADduction: AAROM;Left;15 reps;Supine   Assessment/Plan    PT Assessment Patient needs continued PT services  PT Problem List Decreased strength;Decreased range of motion;Decreased activity tolerance;Decreased mobility;Pain;Obesity;Decreased knowledge of use of DME          PT Treatment Interventions DME instruction;Gait training;Stair training;Functional mobility training;Therapeutic activities;Therapeutic  exercise;Patient/family education    PT Goals (Current goals can be found in the Care Plan section)  Acute Rehab PT Goals Patient Stated Goal: Regain IND and walk without assistive device PT Goal Formulation: With patient Time For Goal Achievement: 08/02/16 Potential to Achieve Goals: Good    Frequency 7X/week   Barriers to discharge        Co-evaluation               End of Session   Activity Tolerance: Patient tolerated treatment well Patient left: in chair;with call bell/phone within reach;with chair alarm set;with family/visitor present Nurse Communication: Mobility status         Time: [id number] PT Time Calculation (min) (ACUTE ONLY): 32 min   Charges:   PT Evaluation $PT Eval Low Complexity: 1 Procedure PT Treatments $Therapeutic Exercise: 8-22 mins   PT G Codes:        Tiffannie Sloss 10-Aug-2016, 11:25 AM

## 2016-07-31 NOTE — Progress Notes (Signed)
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  Reviewed car transfers with pt and caretaker.  Follow Up Recommendations  Home health PT     Equipment Recommendations  None recommended by PT    Recommendations for Other Services OT consult     Precautions / Restrictions Precautions Precautions: Fall Restrictions Weight Bearing Restrictions: No Other Position/Activity Restrictions: WBAT    Mobility  Bed Mobility Overal bed mobility: Needs Assistance Bed Mobility: Supine to Sit;Sit to Supine     Supine to sit: Supervision Sit to supine: Supervision   General bed mobility comments: Increased time and min cues for sequence  Transfers Overall transfer level: Needs assistance Equipment used: Rolling walker (2 wheeled) Transfers: Sit to/from Stand Sit to Stand: Min guard;Supervision         General transfer comment: cues for LE management and use of UEs to self assist; To/from EOB and to/from chair x 2  Ambulation/Gait Ambulation/Gait assistance: Min guard;Supervision Ambulation Distance (Feet): 240 Feet Assistive device: Rolling walker (2 wheeled) Gait Pattern/deviations: Step-through pattern;Decreased step length - right;Decreased step length - left;Shuffle;Trunk flexed Gait velocity: decr Gait velocity interpretation: Below normal speed for age/gender General Gait Details: cues for posture, position from RW and initial sequence   Stairs            Wheelchair Mobility    Modified Rankin (Stroke Patients Only)       Balance Overall balance assessment: No apparent balance deficits (not formally assessed)                                  Cognition Arousal/Alertness: Awake/alert Behavior During Therapy: WFL for  tasks assessed/performed Overall Cognitive Status: Within Functional Limits for tasks assessed                      Exercises Total Joint Exercises Ankle Circles/Pumps: AROM;Both;20 reps;Supine Quad Sets: AROM;Both;10 reps;Supine Heel Slides: AAROM;Left;20 reps;Supine Hip ABduction/ADduction: AAROM;Left;15 reps;Supine    General Comments        Pertinent Vitals/Pain Pain Assessment: 0-10 Pain Score: 4  Pain Location: L hip Pain Descriptors / Indicators: Aching;Sore Pain Intervention(s): Limited activity within patient's tolerance;Monitored during session;Premedicated before session;Ice applied    Home Living Family/patient expects to be discharged to:: Private residence Living Arrangements: Alone Available Help at Discharge: Family;Available 24 hours/day Type of Home: House Home Access: Level entry   Home Layout: One level Home Equipment: Cane - single point;Grab bars - tub/shower;Walker - 2 wheels      Prior Function Level of Independence: Independent with assistive device(s)      Comments: used cane in house and community   PT Goals (current goals can now be found in the care plan section) Acute Rehab PT Goals Patient Stated Goal: Regain IND and walk without assistive device PT Goal Formulation: With patient Time For Goal Achievement: 08/02/16 Potential to Achieve Goals: Good Progress towards PT goals: Progressing toward goals    Frequency    7X/week      PT Plan Current plan remains appropriate    Co-evaluation             End of Session Equipment Utilized During Treatment: Gait belt  Activity Tolerance: Patient tolerated treatment well Patient left: in chair;with call bell/phone within reach;with chair alarm set;with family/visitor present     Time: [id number] PT Time Calculation (min) (ACUTE ONLY): 23 min  Charges:  $Gait Training: 8-22 mins $Therapeutic Exercise: 8-22 mins                    G Codes:       John Hanson 08-13-2016, 1:17 PM

## 2016-07-31 NOTE — Care Management Note (Signed)
Case Management Note  Patient Details  Name: John Hanson MRN: [id number] Date of Birth: Sep 07, 1941  Subjective/Objective:   75 yo admitted with Primary osteo of L hip.  TOTAL HIP ARTHROPLASTY ANTERIOR APPROACH on 07/30/2016    Action/Plan: Pt from home alone. Offered choice for [facility] services and [facility] chosen. [facility] aware. Pt asking for tub transfer bench for home. Pt was informed that it would be $62 out of pocket up front. Pt states he can't pay for that today. Order given to pt for him to get after DC. Pt has all other equipment at home already. No other CM needs communicated.  Expected Discharge Date:                  Expected Discharge Plan:  [name]  In-House Referral:     Discharge planning Services  CM Consult  Post Acute Care Choice:  Home Health Choice offered to:  Patient  DME Arranged:    DME Agency:     HH Arranged:  PT [place]:  [facility] (now [facility])  Status of Service:  In process, will continue to follow  If discussed at Long Length of Stay Meetings, dates discussed:    Additional CommentsLynnell Catalan, RN 07/31/2016, 9:45 AM [phone]

## 2016-07-31 NOTE — Progress Notes (Signed)
Pt to d/c home with [facility] for Pt. No DME needs. AVS reviewed and "My Chart" discussed with pt. Pt capable of verbalizing medications, signs and symptoms of infection, and follow-up appointments. Remains hemodynamically stable. No signs and symptoms of distress. Educated pt to return to ER in the case of SOB, dizziness, or chest pain.

## 2016-08-01 DIAGNOSIS — Z96643 Presence of artificial hip joint, bilateral: Secondary | ICD-10-CM | POA: Diagnosis not present

## 2016-08-01 DIAGNOSIS — Z6837 Body mass index (BMI) 37.0-37.9, adult: Secondary | ICD-10-CM | POA: Diagnosis not present

## 2016-08-01 DIAGNOSIS — Z471 Aftercare following joint replacement surgery: Secondary | ICD-10-CM | POA: Diagnosis not present

## 2016-08-04 DIAGNOSIS — Z96643 Presence of artificial hip joint, bilateral: Secondary | ICD-10-CM | POA: Diagnosis not present

## 2016-08-04 DIAGNOSIS — Z471 Aftercare following joint replacement surgery: Secondary | ICD-10-CM | POA: Diagnosis not present

## 2016-08-04 DIAGNOSIS — Z6837 Body mass index (BMI) 37.0-37.9, adult: Secondary | ICD-10-CM | POA: Diagnosis not present

## 2016-08-06 DIAGNOSIS — Z6837 Body mass index (BMI) 37.0-37.9, adult: Secondary | ICD-10-CM | POA: Diagnosis not present

## 2016-08-06 DIAGNOSIS — Z471 Aftercare following joint replacement surgery: Secondary | ICD-10-CM | POA: Diagnosis not present

## 2016-08-06 DIAGNOSIS — Z96643 Presence of artificial hip joint, bilateral: Secondary | ICD-10-CM | POA: Diagnosis not present

## 2016-08-10 DIAGNOSIS — Z471 Aftercare following joint replacement surgery: Secondary | ICD-10-CM | POA: Diagnosis not present

## 2016-08-10 DIAGNOSIS — Z96643 Presence of artificial hip joint, bilateral: Secondary | ICD-10-CM | POA: Diagnosis not present

## 2016-08-10 DIAGNOSIS — Z6837 Body mass index (BMI) 37.0-37.9, adult: Secondary | ICD-10-CM | POA: Diagnosis not present

## 2016-08-12 DIAGNOSIS — Z471 Aftercare following joint replacement surgery: Secondary | ICD-10-CM | POA: Diagnosis not present

## 2016-08-12 DIAGNOSIS — Z96643 Presence of artificial hip joint, bilateral: Secondary | ICD-10-CM | POA: Diagnosis not present

## 2016-08-12 DIAGNOSIS — Z6837 Body mass index (BMI) 37.0-37.9, adult: Secondary | ICD-10-CM | POA: Diagnosis not present

## 2016-08-13 DIAGNOSIS — Z96642 Presence of left artificial hip joint: Secondary | ICD-10-CM | POA: Diagnosis not present

## 2016-09-10 ENCOUNTER — Other Ambulatory Visit (HOSPITAL_COMMUNITY): Payer: Self-pay | Admitting: Orthopedic Surgery

## 2016-09-10 ENCOUNTER — Ambulatory Visit (HOSPITAL_COMMUNITY)
Admission: RE | Admit: 2016-09-10 | Discharge: 2016-09-10 | Disposition: A | Discharge status: Home / Self Care | Payer: Commercial Managed Care - HMO | Source: Ambulatory Visit | Attending: Cardiology | Admitting: Cardiology

## 2016-09-10 DIAGNOSIS — M79605 Pain in left leg: Secondary | ICD-10-CM | POA: Insufficient documentation

## 2016-09-10 DIAGNOSIS — Z96642 Presence of left artificial hip joint: Secondary | ICD-10-CM | POA: Diagnosis not present

## 2016-09-22 ENCOUNTER — Other Ambulatory Visit (HOSPITAL_COMMUNITY): Payer: Self-pay | Admitting: Family Medicine

## 2016-09-22 DIAGNOSIS — M79605 Pain in left leg: Secondary | ICD-10-CM

## 2016-09-22 DIAGNOSIS — I10 Essential (primary) hypertension: Secondary | ICD-10-CM | POA: Diagnosis not present

## 2016-09-22 DIAGNOSIS — M7989 Other specified soft tissue disorders: Secondary | ICD-10-CM | POA: Diagnosis not present

## 2016-09-22 DIAGNOSIS — Z79899 Other long term (current) drug therapy: Secondary | ICD-10-CM | POA: Diagnosis not present

## 2016-09-23 ENCOUNTER — Ambulatory Visit (HOSPITAL_COMMUNITY)
Admission: RE | Admit: 2016-09-23 | Discharge: 2016-09-23 | Disposition: A | Discharge status: Home / Self Care | Payer: Commercial Managed Care - HMO | Source: Ambulatory Visit | Attending: Family Medicine | Admitting: Family Medicine

## 2016-09-23 DIAGNOSIS — M79605 Pain in left leg: Secondary | ICD-10-CM

## 2016-09-23 DIAGNOSIS — M7989 Other specified soft tissue disorders: Secondary | ICD-10-CM | POA: Insufficient documentation

## 2016-09-23 NOTE — Progress Notes (Signed)
VASCULAR LAB PRELIMINARY  PRELIMINARY  PRELIMINARY  PRELIMINARY  Left lower extremity venous duplex completed.    Preliminary report:  Left:  No evidence of DVT, superficial thrombosis, or Baker's cyst.  Everrett Coombe, RVS 09/23/2016, 10:48 AM

## 2016-10-13 DIAGNOSIS — M7989 Other specified soft tissue disorders: Secondary | ICD-10-CM | POA: Diagnosis not present

## 2016-10-13 DIAGNOSIS — I10 Essential (primary) hypertension: Secondary | ICD-10-CM | POA: Diagnosis not present

## 2016-10-16 ENCOUNTER — Other Ambulatory Visit: Payer: Self-pay | Admitting: Vascular Surgery

## 2016-10-16 DIAGNOSIS — M7989 Other specified soft tissue disorders: Secondary | ICD-10-CM

## 2016-10-22 DIAGNOSIS — Z96642 Presence of left artificial hip joint: Secondary | ICD-10-CM | POA: Diagnosis not present

## 2016-10-22 DIAGNOSIS — Z471 Aftercare following joint replacement surgery: Secondary | ICD-10-CM | POA: Diagnosis not present

## 2016-10-22 DIAGNOSIS — M7632 Iliotibial band syndrome, left leg: Secondary | ICD-10-CM | POA: Diagnosis not present

## 2016-11-18 ENCOUNTER — Encounter: Payer: Self-pay | Admitting: Vascular Surgery

## 2016-11-24 ENCOUNTER — Encounter (HOSPITAL_COMMUNITY): Payer: Commercial Managed Care - HMO

## 2016-11-24 ENCOUNTER — Ambulatory Visit (INDEPENDENT_AMBULATORY_CARE_PROVIDER_SITE_OTHER): Payer: Medicare HMO | Admitting: Vascular Surgery

## 2016-11-24 ENCOUNTER — Encounter: Payer: Self-pay | Admitting: Vascular Surgery

## 2016-11-24 ENCOUNTER — Ambulatory Visit (HOSPITAL_COMMUNITY)
Admission: RE | Admit: 2016-11-24 | Discharge: 2016-11-24 | Disposition: A | Discharge status: Home / Self Care | Payer: Medicare HMO | Source: Ambulatory Visit | Attending: Vascular Surgery | Admitting: Vascular Surgery

## 2016-11-24 ENCOUNTER — Encounter: Payer: Commercial Managed Care - HMO | Admitting: Vascular Surgery

## 2016-11-24 VITALS — BP 167/93 | HR 84 | Temp 97.8°F | Resp 18 | Ht 68.5 in | Wt 245.0 lb

## 2016-11-24 DIAGNOSIS — I771 Stricture of artery: Secondary | ICD-10-CM | POA: Diagnosis not present

## 2016-11-24 DIAGNOSIS — M7989 Other specified soft tissue disorders: Secondary | ICD-10-CM

## 2016-11-24 DIAGNOSIS — M79605 Pain in left leg: Secondary | ICD-10-CM

## 2016-11-24 NOTE — Addendum Note (Signed)
Addended by: Lianne Cure A on: 11/24/2016 02:00 PM   Modules accepted: Orders

## 2016-11-24 NOTE — Progress Notes (Signed)
Vascular and Vein Specialist of [facility]  Patient name: John Hanson MRN: [id number] DOB: 01-25-1941 Sex: male  REASON FOR VISIT: Evaluation of left leg swelling  HPI: John Hanson is a 76 y.o. male today for evaluation of left leg swelling. He is well known to me from laser ablation of his right great saphenous vein in September 2015. He had been having pain and swelling with venous hypertension in the right leg. He is done extremely well and has had no further difficulties. He has had bilateral hip replacements in 2017 initially in the right and then on the left leg in December 2017. He had persistent hole leg swelling after his hip replacement on the left. He had 2 separate duplexes to rule out DVT which were negative. He is seen today for discussion of continued swelling. He is a remain stable otherwise. No history of DVT or pulmonary embolus  Past Medical History:  Diagnosis Date  . Anxiety   . Aortic stenosis    a. 06/2015 Echo: EF 50-55%, mild diff HK, Gr1 DD, mild AS w/ restricted mobility of R coronary and non-coronary cusp, mildly dil Ao root (51mm), mildly dil LA, nl RV fxn, mild TR/PR, PASP 13mmHg.  . Arthritis   . Depression   . Essential hypertension   . GERD (gastroesophageal reflux disease)   . Numbness and tingling    fingertips bilat   . Osteoarthritis    a. R>L hip pending R THA.  . Sleep apnea    a. pt states does not use CPAP machine   . Varicose veins     Family History  Problem Relation Age of Onset  . Kidney disease Mother     SOCIAL HISTORY: Social History  Substance Use Topics  . Smoking status: Former Smoker    Years: 5.00    Types: Cigars    Quit date: 09/28/1968  . Smokeless tobacco: Never Used  . Alcohol use No    No Known Allergies  Current Outpatient Prescriptions  Medication Sig Dispense Refill  . allopurinol (ZYLOPRIM) 300 MG tablet Take 300 mg by mouth daily.    Marland Kitchen amLODipine  (NORVASC) 10 MG tablet Take 10 mg by mouth daily.    Marland Kitchen aspirin EC 325 MG EC tablet Take 1 tablet (325 mg total) by mouth 2 (two) times daily with a meal. 60 tablet 1  . citalopram (CELEXA) 40 MG tablet Take 40 mg by mouth daily.    Marland Kitchen docusate sodium (COLACE) 100 MG capsule Take 1 capsule (100 mg total) by mouth 2 (two) times daily. 60 capsule 3  . furosemide (LASIX) 20 MG tablet Take 1 tablet (20 mg total) by mouth daily. 30 tablet 6  . HYDROcodone-acetaminophen (NORCO/VICODIN) 5-325 MG tablet Take 1-2 tablets by mouth every 4 (four) hours as needed (breakthrough pain). 80 tablet 0  . labetalol (NORMODYNE) 300 MG tablet Take 300 mg by mouth 3 (three) times daily.    . naproxen sodium (ANAPROX) 220 MG tablet Take 220-440 mg by mouth 2 (two) times daily as needed (for pain.).    Marland Kitchen omeprazole (PRILOSEC) 40 MG capsule Take 40 mg by mouth daily.    . ondansetron (ZOFRAN) 4 MG tablet Take 1 tablet (4 mg total) by mouth every 6 (six) hours as needed for nausea. 20 tablet 0  . senna (SENOKOT) 8.6 MG TABS tablet Take 2 tablets (17.2 mg total) by mouth at bedtime. 60 each 3  . valsartan (DIOVAN) 320 MG tablet Take 320  mg by mouth daily.     . traZODone (DESYREL) 150 MG tablet Take 150 mg by mouth at bedtime.     No current facility-administered medications for this visit.     REVIEW OF SYSTEMS:  [X]  denotes positive finding, [ ]  denotes negative finding Cardiac  Comments:  Chest pain or chest pressure:    Shortness of breath upon exertion:    Short of breath when lying flat:    Irregular heart rhythm:        Vascular    Pain in calf, thigh, or hip brought on by ambulation:    Pain in feet at night that wakes you up from your sleep:     Blood clot in your veins:    Leg swelling:  x         PHYSICAL EXAM: Vitals:   11/24/16 1122 11/24/16 1123  BP: (!) 178/104 (!) 167/93  Pulse: 84   Resp: 18   Temp: 97.8 F (36.6 C)   SpO2: 95%   Weight: 245 lb (111.1 kg)   Height: 5' 8.5" (1.74 m)      GENERAL: The patient is a well-nourished male, in no acute distress. The vital signs are documented above. CARDIOVASCULAR: Palpable radial pulses bilaterally. He does have edema in both feet. I do not palpate pedal pulses. He does have significant swelling with thickening in the skin and his left leg from his knee distally onto his ankle. No tissue loss. PULMONARY: There is good air exchange  MUSCULOSKELETAL: There are no major deformities or cyanosis. NEUROLOGIC: No focal weakness or paresthesias are detected. SKIN: There are no ulcers or rashes noted. PSYCHIATRIC: The patient has a normal affect.  DATA:  He underwent a formal venous duplex of his left leg and also image this with SonoSite ultrasound. He does have the mild dilatation of the saphenous vein and some reflux in [place] several segments but not throughout the saphenous vein. He does have reflux in his common femoral vein on the left.  SonoSite he has multiple small tributary branches arising from this the saphenous vein is not particularly dilated  MEDICAL ISSUES: Fillet is a swelling is still related to resolution of his hip surgery. He and his wife both report that this is markedly improved over time. I have recommended continued elevation and observation. He has been unable to tolerate compression garments in the past. I recommend that we see him again in 6 months with repeat duplex to determine continued resolution of this and discuss treatment if needed. Suspect that he will be able to tolerate this with conservative treatment only.    Rosetta Posner, MD FACS Vascular and Vein Specialists of [facility] Tel [phone] Pager [phone]

## 2016-11-25 ENCOUNTER — Encounter: Payer: Self-pay | Admitting: Family Medicine

## 2016-11-27 ENCOUNTER — Ambulatory Visit (INDEPENDENT_AMBULATORY_CARE_PROVIDER_SITE_OTHER): Payer: Medicare HMO | Admitting: Physician Assistant

## 2016-11-27 ENCOUNTER — Encounter: Payer: Self-pay | Admitting: Physician Assistant

## 2016-11-27 VITALS — BP 131/79 | HR 70 | Ht 68.5 in | Wt 249.0 lb

## 2016-11-27 DIAGNOSIS — Z79899 Other long term (current) drug therapy: Secondary | ICD-10-CM

## 2016-11-27 DIAGNOSIS — R0600 Dyspnea, unspecified: Secondary | ICD-10-CM | POA: Diagnosis not present

## 2016-11-27 DIAGNOSIS — I10 Essential (primary) hypertension: Secondary | ICD-10-CM

## 2016-11-27 LAB — BASIC METABOLIC PANEL
BUN: 18 mg/dL (ref 7–25)
CHLORIDE: 106 mmol/L (ref 98–110)
CO2: 26 mmol/L (ref 20–31)
Calcium: 9.4 mg/dL (ref 8.6–10.3)
Creat: 1.15 mg/dL (ref 0.70–1.18)
Glucose, Bld: 95 mg/dL (ref 65–99)
POTASSIUM: 4.2 mmol/L (ref 3.5–5.3)
SODIUM: 140 mmol/L (ref 135–146)

## 2016-11-27 NOTE — Patient Instructions (Signed)
Medication Instructions:  Your physician recommends that you continue on your current medications as directed. Please refer to the Current Medication list given to you today.  Labwork: Please have lab work TODAY (BMET)  Testing/Procedures: NONE  Follow-Up: Your physician wants you to follow-up in: 6 months with Dr. Debara Pickett. You will receive a reminder letter in the mail two months in advance. If you don't receive a letter, please call our office to schedule the follow-up appointment.   Any Other Special Instructions Will Be Listed Below (If Applicable).     If you need a refill on your cardiac medications before your next appointment, please call your pharmacy.

## 2016-11-27 NOTE — Progress Notes (Signed)
Cardiology Office Note    Date:  11/27/2016   ID:  John Hanson, DOB 07-Nov-1940, MRN [id number]  PCP:  John Amel, MD  Cardiologist:  John Hanson   Chief Complaint  Patient presents with  . Follow-up    seen for John Hanson. Chronic dyspnea    History of Present Illness:  John Hanson is a 76 y.o. male with PMH of HTN, obesity and osteoarthritis. He was evaluated in cardiology office in October 2016 for preoperative evaluation in the setting of mildly abnormal EKG and a systolic murmur. Echocardiogram obtained showed normal EF, mild aortic stenosis, grade 1 diastolic dysfunction. He underwent lumbar laminectomy and decompression in December 2016 and recovered well afterward. Last time he was seen the cardiology office was on 03/24/2016 for preoperative clearance for right total hip replacement which was eventually done on 03/26/2016. He was started on 20 mg daily Lasix. He underwent left total hip arthroplasty in November 2017. He was recently seen by his vascular surgery John Hanson for left lower extremity edema with negative venous Doppler. He recommended conservative management.  He presents to cardiology office for follow-up. For some reason, his last basic metabolic panel obtained in November did show acute renal insufficiency with creatinine going up to 1.3. I will recheck a basic metabolic panel today. Otherwise he has been doing quite well. He does have chronic dyspnea after his bilateral hip replacement surgery last year. I think this is due to deconditioning. He has not been doing much activity. I recommended some bicycle exercise at the water aerobic. His blood pressure is under good control. He is compliant with his medications. Furthermore, he does not believe he needs any Norco or trazodone anymore. Therefore he has discontinued both medication. His left lower extremity does have mild discoloration, as pointed out by John Hanson recently, he has had several negative venous  Doppler recently. Otherwise, he is stable from cardiac perspective. He Hanson follow-up in 6 months. Of note, he does not seems to be aware that he has anemia as evident based on last year's lab. He does not notice any bleeding history. Interestingly enough, in 2016, his hemoglobin was normal.   Past Medical History:  Diagnosis Date  . Anxiety   . Aortic stenosis    a. 06/2015 Echo: EF 50-55%, mild diff HK, Gr1 DD, mild AS w/ restricted mobility of R coronary and non-coronary cusp, mildly dil Ao root (68mm), mildly dil LA, nl RV fxn, mild TR/PR, PASP 31mmHg.  . Arthritis   . Depression   . Essential hypertension   . GERD (gastroesophageal reflux disease)   . Numbness and tingling    fingertips bilat   . Osteoarthritis    a. R>L hip pending R THA.  . Sleep apnea    a. pt states does not use CPAP machine   . Varicose veins     Past Surgical History:  Procedure Laterality Date  . ENDOVENOUS ABLATION SAPHENOUS VEIN W/ LASER Right 05-31-2014   EVLA  right gretaer saphenous vein by John Jews MD  . LUMBAR LAMINECTOMY/DECOMPRESSION MICRODISCECTOMY N/A 09/04/2015   Procedure: MICRO LUMBAR DECOMPRESSION L4 - L5,  L3 - L4 2 LEVELS;  Surgeon: John Day, MD;  Location: [facility];  Service: Orthopedics;  Laterality: N/A;  . TOTAL HIP ARTHROPLASTY Right 03/26/2016   Procedure: RIGHT TOTAL HIP ARTHROPLASTY ANTERIOR APPROACH;  Surgeon: John Can, MD;  Location: [facility];  Service: Orthopedics;  Laterality: Right;  . TOTAL HIP ARTHROPLASTY Left 07/30/2016   Procedure:  TOTAL HIP ARTHROPLASTY ANTERIOR APPROACH;  Surgeon: John Can, MD;  Location: [facility];  Service: Orthopedics;  Laterality: Left;    Current Medications: Outpatient Medications Prior to Visit  Medication Sig Dispense Refill  . allopurinol (ZYLOPRIM) 300 MG tablet Take 300 mg by mouth daily.    Marland Kitchen amLODipine (NORVASC) 10 MG tablet Take 10 mg by mouth daily.    . citalopram (CELEXA) 40 MG tablet Take 40 mg by mouth daily.    Marland Kitchen  docusate sodium (COLACE) 100 MG capsule Take 1 capsule (100 mg total) by mouth 2 (two) times daily. 60 capsule 3  . furosemide (LASIX) 20 MG tablet Take 1 tablet (20 mg total) by mouth daily. 30 tablet 6  . labetalol (NORMODYNE) 300 MG tablet Take 300 mg by mouth 3 (three) times daily.    Marland Kitchen omeprazole (PRILOSEC) 40 MG capsule Take 40 mg by mouth daily.    . valsartan (DIOVAN) 320 MG tablet Take 320 mg by mouth daily.     Marland Kitchen aspirin EC 325 MG EC tablet Take 1 tablet (325 mg total) by mouth 2 (two) times daily with a meal. 60 tablet 1  . HYDROcodone-acetaminophen (NORCO/VICODIN) 5-325 MG tablet Take 1-2 tablets by mouth every 4 (four) hours as needed (breakthrough pain). 80 tablet 0  . naproxen sodium (ANAPROX) 220 MG tablet Take 220-440 mg by mouth 2 (two) times daily as needed (for pain.).    Marland Kitchen ondansetron (ZOFRAN) 4 MG tablet Take 1 tablet (4 mg total) by mouth every 6 (six) hours as needed for nausea. 20 tablet 0  . senna (SENOKOT) 8.6 MG TABS tablet Take 2 tablets (17.2 mg total) by mouth at bedtime. 60 each 3  . traZODone (DESYREL) 150 MG tablet Take 150 mg by mouth at bedtime.     No facility-administered medications prior to visit.      Allergies:   Patient has no known allergies.   Social History   Social History  . Marital status: Widowed    Spouse name: N/A  . Number of children: N/A  . Years of education: N/A   Social History Main Topics  . Smoking status: Former Smoker    Years: 5.00    Types: Cigars    Quit date: 09/28/1968  . Smokeless tobacco: Never Used  . Alcohol use No  . Drug use: No  . Sexual activity: Not Asked   Other Topics Concern  . None   Social History Narrative  . None     Family History:  The patient's family history includes Kidney disease in his mother.   ROS:   Please see the history of present illness.    ROS All other systems reviewed and are negative.   PHYSICAL EXAM:   VS:  BP 131/79   Pulse 70   Ht 5' 8.5" (1.74 m)   Wt 249 lb  (112.9 kg)   BMI 37.31 kg/m    GEN: Well nourished, well developed, in no acute distress  HEENT: normal  Neck: no JVD, carotid bruits, or masses Cardiac: RRR; no murmurs, rubs, or gallops,no edema  Respiratory:  clear to auscultation bilaterally, normal work of breathing GI: soft, nontender, nondistended, + BS MS: no deformity or atrophy  Skin: warm and dry, no rash Neuro:  Alert and Oriented x 3, Strength and sensation are intact Psych: euthymic mood, full affect  Wt Readings from Last 3 Encounters:  11/27/16 249 lb (112.9 kg)  11/24/16 245 lb (111.1 kg)  07/30/16 245 lb (111.1  kg)      Studies/Labs Reviewed:   EKG:  EKG is ordered today.  The ekg ordered today demonstrates Normal sinus rhythm, heart rate 70, no significant ST-T wave changes.  Recent Labs: 07/31/2016: BUN 19; Creatinine, Ser 1.30; Hemoglobin 9.6; Platelets 189; Potassium 4.3; Sodium 133   Lipid Panel No results found for: CHOL, TRIG, HDL, CHOLHDL, VLDL, LDLCALC, LDLDIRECT  Additional studies/ records that were reviewed today include:   Echo 07/25/2015 LV EF: 50% -   55%  - Left ventricle: Wall thickness was increased in a pattern of mild   LVH. Systolic function was normal. The estimated ejection   fraction was in the range of 50% to 55%. Mild diffuse   hypokinesis. Doppler parameters are consistent with abnormal left   ventricular relaxation (grade 1 diastolic dysfunction). - Aortic valve: Moderately calcified annulus. Moderately calcified   leaflets. Right coronary and noncoronary cusp mobility was   restricted. There was very mild stenosis. Peak velocity (S): 186   cm/s. Mean gradient (S): 7 mm Hg. Valve area (VTI): 2.01 cm^2.   Valve area (Vmean): 1.73 cm^2. - Aorta: Aortic root dimension: 37 mm (ED). Ascending aortic   diameter: 38 mm (S). - Aortic root: The aortic root was mildly dilated. - Ascending aorta: The ascending aorta was mildly dilated. - Left atrium: The atrium was mildly  dilated. - Right ventricle: The cavity size was normal. Wall thickness was   normal. Systolic function was normal. - Atrial septum: No defect or patent foramen ovale was identified. - Tricuspid valve: There was mild regurgitation. - Pulmonic valve: There was trivial regurgitation. - Pulmonary arteries: PA peak pressure: 35 mm Hg (S).   Venous doppler 11/24/2016   ASSESSMENT:    1. Essential hypertension   2. Medication management   3. Dyspnea, unspecified type      PLAN:  In order of problems listed above:  1. Hypertension: Blood pressure well controlled on current medication, no change was made today. It does appears he had acute renal insufficiency back in November, but I did not see any repeat lab. I will obtain a basic metabolic panel today to make sure his renal function stable on the current dose of Lasix and valsartan.  2. Dyspnea: Dyspnea started after his bilateral total hip replacement last year, I think he has some degree of deconditioning. He denies any obvious chest pain. I recommended bicycle exercise and water aerobic.    Medication Adjustments/Labs and Tests Ordered: Current medicines are reviewed at length with the patient today.  Concerns regarding medicines are outlined above.  Medication changes, Labs and Tests ordered today are listed in the Patient Instructions below. Patient Instructions  Medication Instructions:  Your physician recommends that you continue on your current medications as directed. Please refer to the Current Medication list given to you today.  Labwork: Please have lab work TODAY (BMET)  Testing/Procedures: NONE  Follow-Up: Your physician wants you to follow-up in: 6 months with John Hanson. You will receive a reminder letter in the mail two months in advance. If you don't receive a letter, please call our office to schedule the follow-up appointment.   Any Other Special Instructions Will Be Listed Below (If Applicable).     If  you need a refill on your cardiac medications before your next appointment, please call your pharmacy.      Hilbert Corrigan, [place]  11/27/2016 8:41 PM    [facility] [address], [place], [place]  [address]  Phone: [phone]; Fax: [phone]

## 2016-11-30 NOTE — Progress Notes (Signed)
Kidney function improved. Electrolyte stable

## 2016-12-11 DIAGNOSIS — J209 Acute bronchitis, unspecified: Secondary | ICD-10-CM | POA: Diagnosis not present

## 2016-12-11 DIAGNOSIS — I10 Essential (primary) hypertension: Secondary | ICD-10-CM | POA: Diagnosis not present

## 2016-12-28 DIAGNOSIS — Z96642 Presence of left artificial hip joint: Secondary | ICD-10-CM | POA: Diagnosis not present

## 2016-12-28 DIAGNOSIS — Z471 Aftercare following joint replacement surgery: Secondary | ICD-10-CM | POA: Diagnosis not present

## 2016-12-28 DIAGNOSIS — M7062 Trochanteric bursitis, left hip: Secondary | ICD-10-CM | POA: Diagnosis not present

## 2017-01-06 DIAGNOSIS — E7800 Pure hypercholesterolemia, unspecified: Secondary | ICD-10-CM | POA: Diagnosis not present

## 2017-01-06 DIAGNOSIS — Z79899 Other long term (current) drug therapy: Secondary | ICD-10-CM | POA: Diagnosis not present

## 2017-01-06 DIAGNOSIS — I10 Essential (primary) hypertension: Secondary | ICD-10-CM | POA: Diagnosis not present

## 2017-01-06 DIAGNOSIS — F419 Anxiety disorder, unspecified: Secondary | ICD-10-CM | POA: Diagnosis not present

## 2017-01-06 DIAGNOSIS — M109 Gout, unspecified: Secondary | ICD-10-CM | POA: Diagnosis not present

## 2017-01-06 DIAGNOSIS — I77819 Aortic ectasia, unspecified site: Secondary | ICD-10-CM | POA: Diagnosis not present

## 2017-01-06 DIAGNOSIS — Z0001 Encounter for general adult medical examination with abnormal findings: Secondary | ICD-10-CM | POA: Diagnosis not present

## 2017-01-07 DIAGNOSIS — M7062 Trochanteric bursitis, left hip: Secondary | ICD-10-CM | POA: Diagnosis not present

## 2017-01-14 DIAGNOSIS — M7062 Trochanteric bursitis, left hip: Secondary | ICD-10-CM | POA: Diagnosis not present

## 2017-01-19 DIAGNOSIS — M7062 Trochanteric bursitis, left hip: Secondary | ICD-10-CM | POA: Diagnosis not present

## 2017-01-21 DIAGNOSIS — M7062 Trochanteric bursitis, left hip: Secondary | ICD-10-CM | POA: Diagnosis not present

## 2017-03-29 DIAGNOSIS — Z471 Aftercare following joint replacement surgery: Secondary | ICD-10-CM | POA: Diagnosis not present

## 2017-03-29 DIAGNOSIS — M7062 Trochanteric bursitis, left hip: Secondary | ICD-10-CM | POA: Diagnosis not present

## 2017-03-29 DIAGNOSIS — Z96642 Presence of left artificial hip joint: Secondary | ICD-10-CM | POA: Diagnosis not present

## 2017-04-07 ENCOUNTER — Other Ambulatory Visit: Payer: Self-pay | Admitting: Orthopedic Surgery

## 2017-04-07 DIAGNOSIS — M7062 Trochanteric bursitis, left hip: Secondary | ICD-10-CM

## 2017-04-15 ENCOUNTER — Other Ambulatory Visit: Payer: Medicare HMO

## 2017-04-19 ENCOUNTER — Ambulatory Visit
Admission: RE | Admit: 2017-04-19 | Discharge: 2017-04-19 | Disposition: A | Discharge status: Home / Self Care | Payer: Medicare HMO | Source: Ambulatory Visit | Attending: Orthopedic Surgery | Admitting: Orthopedic Surgery

## 2017-04-19 DIAGNOSIS — M7062 Trochanteric bursitis, left hip: Secondary | ICD-10-CM

## 2017-04-19 DIAGNOSIS — M48061 Spinal stenosis, lumbar region without neurogenic claudication: Secondary | ICD-10-CM | POA: Diagnosis not present

## 2017-04-19 MED ORDER — DIAZEPAM 5 MG PO TABS
5.0000 mg | ORAL_TABLET | Freq: Once | ORAL | Status: AC
  Administered 2017-04-19: 5 mg via ORAL

## 2017-04-19 MED ORDER — IOPAMIDOL (ISOVUE-M 200) INJECTION 41%
15.0000 mL | Freq: Once | INTRAMUSCULAR | Status: AC
  Administered 2017-04-19: 15 mL via INTRATHECAL

## 2017-04-19 NOTE — Progress Notes (Signed)
Pt states he has been off Celexa since Friday.

## 2017-04-19 NOTE — Discharge Instructions (Signed)
Myelogram Discharge Instructions  1. Go home and rest quietly for the next 24 hours.  It is important to lie flat for the next 24 hours.  Get up only to go to the restroom.  You may lie in the bed or on a couch on your back, your stomach, your left side or your right side.  You may have one pillow under your head.  You may have pillows between your knees while you are on your side or under your knees while you are on your back.  2. DO NOT drive today.  Recline the seat as far back as it will go, while still wearing your seat belt, on the way home.  3. You may get up to go to the bathroom as needed.  You may sit up for 10 minutes to eat.  You may resume your normal diet and medications unless otherwise indicated.  Drink lots of extra fluids today and tomorrow.  4. The incidence of headache, nausea, or vomiting is about 5% (one in 20 patients).  If you develop a headache, lie flat and drink plenty of fluids until the headache goes away.  Caffeinated beverages may be helpful.  If you develop severe nausea and vomiting or a headache that does not go away with flat bed rest, call [phone].  5. You may resume normal activities after your 24 hours of bed rest is over; however, do not exert yourself strongly or do any heavy lifting tomorrow. If when you get up you have a headache when standing, go back to bed and force fluids for another 24 hours.  6. Call your physician for a follow-up appointment.  The results of your myelogram will be sent directly to your physician by the following day.  7. If you have any questions or if complications develop after you arrive home, please call [phone].  Discharge instructions have been explained to the patient.  The patient, or the person responsible for the patient, fully understands these instructions.       May resume Celexa on April 20, 2017, after 10:30 am.

## 2017-04-27 DIAGNOSIS — M5137 Other intervertebral disc degeneration, lumbosacral region: Secondary | ICD-10-CM | POA: Diagnosis not present

## 2017-04-27 DIAGNOSIS — M4126 Other idiopathic scoliosis, lumbar region: Secondary | ICD-10-CM | POA: Diagnosis not present

## 2017-04-27 DIAGNOSIS — M961 Postlaminectomy syndrome, not elsewhere classified: Secondary | ICD-10-CM | POA: Diagnosis not present

## 2017-04-27 DIAGNOSIS — M48062 Spinal stenosis, lumbar region with neurogenic claudication: Secondary | ICD-10-CM | POA: Diagnosis not present

## 2017-04-29 DIAGNOSIS — M48062 Spinal stenosis, lumbar region with neurogenic claudication: Secondary | ICD-10-CM | POA: Diagnosis not present

## 2017-04-29 DIAGNOSIS — M4696 Unspecified inflammatory spondylopathy, lumbar region: Secondary | ICD-10-CM | POA: Diagnosis not present

## 2017-04-30 ENCOUNTER — Other Ambulatory Visit: Payer: Self-pay | Admitting: Pharmacy Technician

## 2017-04-30 NOTE — Patient Outreach (Signed)
[facility]) Care Management  04/30/2017  JAMIEON LANNEN 03/24/1941 [id number]   Contacted [facility] to verify last Fill date of patients Atorvastatin 10mg  for medication adherence. Spoke to [place] whom verified that patient last filled 30 day script on 04/27/2017  Maud Deed. [place], [facility] [phone]

## 2017-05-21 ENCOUNTER — Ambulatory Visit (INDEPENDENT_AMBULATORY_CARE_PROVIDER_SITE_OTHER): Payer: Medicare HMO | Admitting: Internal Medicine

## 2017-05-21 VITALS — BP 140/90 | HR 66 | Ht 68.0 in | Wt 258.0 lb

## 2017-05-21 DIAGNOSIS — R0609 Other forms of dyspnea: Secondary | ICD-10-CM | POA: Diagnosis not present

## 2017-05-21 DIAGNOSIS — I10 Essential (primary) hypertension: Secondary | ICD-10-CM

## 2017-05-21 DIAGNOSIS — I77810 Thoracic aortic ectasia: Secondary | ICD-10-CM | POA: Insufficient documentation

## 2017-05-21 DIAGNOSIS — I350 Nonrheumatic aortic (valve) stenosis: Secondary | ICD-10-CM

## 2017-05-21 NOTE — Patient Instructions (Addendum)
Your physician has requested that you have an echocardiogram @ [address] - [address]. Echocardiography is a painless test that uses sound waves to create images of your heart. It provides your doctor with information about the size and shape of your heart and how well your heart's chambers and valves are working. This procedure takes approximately one hour. There are no restrictions for this procedure.  Your physician recommends that you schedule a follow-up appointment with Dr. Hilty after your echocardiogram  

## 2017-05-21 NOTE — Progress Notes (Signed)
OFFICE NOTE  Chief Complaint:  Routine follow-up, DOE  Primary Care Physician: Lujean Amel, MD  HPI:  John Hanson is a pleasant 76 year old male is coming referred to me for preoperative clearance prior to back surgery. He said planning on undergoing spine surgery by Dr. Susa Day. He's recently been having some shortness of breath with exertion and underwent preoperative evaluation which demonstrated mildly abnormal EKG. I repeated the EKG in the office today which shows nonspecific lateral and inferior ST and T-wave changes. It is a normal sinus rhythm. He denies any chest pain but reports some mild shortness of breath. Recently was seen by his primary care provider who noted a systolic murmur. An echocardiogram was suggested that I do not see that it has been performed. He has no known cardiac history. There is no significant coronary disease in the family. He does have cardiac risk factors including hypertension, but no significant dyslipidemia or diabetes.  05/21/2017  John Hanson returns today for follow-up. This is a routine appointment. He last saw Almyra Deforest, [place], in March 2018 for follow-up of essential hypertension and dyspnea. Blood pressure is well controlled and he felt that his dyspnea was due to some deconditioning. No chest pain was noted. He recommended exercise. John Hanson since then has had some progressive dyspnea. His last echo was in 2016 which showed low normal LVEF 50-55% and mild aortic stenosis. He does have some peripheral vascular disease and is undergoing a venous insufficiency study with follow-up seeing Dr. Donnetta Hutching.  PMHx:  Past Medical History:  Diagnosis Date  . Anxiety   . Aortic stenosis    a. 06/2015 Echo: EF 50-55%, mild diff HK, Gr1 DD, mild AS w/ restricted mobility of R coronary and non-coronary cusp, mildly dil Ao root (60mm), mildly dil LA, nl RV fxn, mild TR/PR, PASP 78mmHg.  . Arthritis   . Depression   . Essential  hypertension   . GERD (gastroesophageal reflux disease)   . Numbness and tingling    fingertips bilat   . Osteoarthritis    a. R>L hip pending R THA.  . Sleep apnea    a. pt states does not use CPAP machine   . Varicose veins     Past Surgical History:  Procedure Laterality Date  . ENDOVENOUS ABLATION SAPHENOUS VEIN W/ LASER Right 05-31-2014   EVLA  right gretaer saphenous vein by Curt Jews MD  . LUMBAR LAMINECTOMY/DECOMPRESSION MICRODISCECTOMY N/A 09/04/2015   Procedure: MICRO LUMBAR DECOMPRESSION L4 - L5,  L3 - L4 2 LEVELS;  Surgeon: Susa Day, MD;  Location: [facility];  Service: Orthopedics;  Laterality: N/A;  . TOTAL HIP ARTHROPLASTY Right 03/26/2016   Procedure: RIGHT TOTAL HIP ARTHROPLASTY ANTERIOR APPROACH;  Surgeon: Rod Can, MD;  Location: [facility];  Service: Orthopedics;  Laterality: Right;  . TOTAL HIP ARTHROPLASTY Left 07/30/2016   Procedure: TOTAL HIP ARTHROPLASTY ANTERIOR APPROACH;  Surgeon: Rod Can, MD;  Location: [facility];  Service: Orthopedics;  Laterality: Left;    FAMHx:  Family History  Problem Relation Age of Onset  . Kidney disease Mother     SOCHx:   reports that he quit smoking about 48 years ago. His smoking use included Cigars. He quit after 5.00 years of use. He has never used smokeless tobacco. He reports that he does not drink alcohol or use drugs.  ALLERGIES:  No Known Allergies  ROS: Pertinent items noted in HPI and remainder of comprehensive ROS otherwise negative.  HOME MEDS: Current Outpatient  Prescriptions  Medication Sig Dispense Refill  . allopurinol (ZYLOPRIM) 300 MG tablet Take 300 mg by mouth daily.    Marland Kitchen amLODipine (NORVASC) 10 MG tablet Take 10 mg by mouth daily.    Marland Kitchen aspirin EC 81 MG tablet Take 81 mg by mouth daily.    . citalopram (CELEXA) 40 MG tablet Take 40 mg by mouth daily.    Marland Kitchen docusate sodium (COLACE) 100 MG capsule Take 1 capsule (100 mg total) by mouth 2 (two) times daily. 60 capsule 3  . furosemide (LASIX) 20  MG tablet Take 1 tablet (20 mg total) by mouth daily. 30 tablet 6  . labetalol (NORMODYNE) 300 MG tablet Take 300 mg by mouth 3 (three) times daily.    Marland Kitchen omeprazole (PRILOSEC) 40 MG capsule Take 40 mg by mouth daily.    . valsartan (DIOVAN) 320 MG tablet Take 320 mg by mouth daily.     Marland Kitchen atorvastatin (LIPITOR) 10 MG tablet Take 10 mg by mouth daily.     No current facility-administered medications for this visit.     LABS/IMAGING: No results found for this or any previous visit (from the past 48 hour(s)). No results found.  WEIGHTS: Wt Readings from Last 3 Encounters:  05/21/17 258 lb (117 kg)  11/27/16 249 lb (112.9 kg)  11/24/16 245 lb (111.1 kg)    VITALS: BP 140/90   Pulse 66   Ht 5\' 8"  (1.727 m)   Wt 258 lb (117 kg)   BMI 39.23 kg/m   EXAM: General appearance: alert and no distress Neck: no carotid bruit, no JVD and thyroid not enlarged, symmetric, no tenderness/mass/nodules Lungs: clear to auscultation bilaterally Heart: regular rate and rhythm, S1, S2 normal and systolic murmur: early systolic 3/6, crescendo at lower left sternal border Abdomen: soft, non-tender; bowel sounds normal; no masses,  no organomegaly Extremities: extremities normal, atraumatic, no cyanosis or edema Pulses: 2+ and symmetric Skin: Skin color, texture, turgor normal. No rashes or lesions Neurologic: Grossly normal Psych: Pleasant  EKG: Normal sinus rhythm 66, nonspecific T wave changes-personally reviewed  ASSESSMENT: 1. Progressive DOE 2. Murmur - mild aortic stenosis 3. Abnormal EKG with nonspecific ST and T changes 4. Mild dyspnea on exertion  PLAN: 1.   John Hanson is reporting progressive dyspnea on exertion. This is recently attributed to deconditioning and weight gain, and he certainly had both, but his symptoms are progressive. I like to get a repeat echo as he has a history of mild aortic stenosis. EF was also low normal in the past. He has nonspecific ST and T-wave changes  on his EKG which have been stable. He denies any anginal symptoms. If the echo is unrevealing and his symptoms progress, may need to consider a Lexiscan Myoview as he has significant orthopedic and back problems which would preclude him exercising.  Follow-up with me one month.  Pixie Casino, MD, [facility] Attending Cardiologist [place] C Hilty 05/21/2017, 12:24 PM

## 2017-05-25 ENCOUNTER — Encounter (HOSPITAL_COMMUNITY): Payer: Medicare HMO

## 2017-05-25 ENCOUNTER — Ambulatory Visit: Payer: Medicare HMO | Admitting: Vascular Surgery

## 2017-05-26 ENCOUNTER — Ambulatory Visit (HOSPITAL_COMMUNITY): Payer: Medicare HMO | Attending: Cardiology

## 2017-05-26 ENCOUNTER — Other Ambulatory Visit: Payer: Self-pay

## 2017-05-26 DIAGNOSIS — R011 Cardiac murmur, unspecified: Secondary | ICD-10-CM | POA: Insufficient documentation

## 2017-05-26 DIAGNOSIS — I350 Nonrheumatic aortic (valve) stenosis: Secondary | ICD-10-CM

## 2017-05-26 DIAGNOSIS — I77810 Thoracic aortic ectasia: Secondary | ICD-10-CM

## 2017-05-26 DIAGNOSIS — I77819 Aortic ectasia, unspecified site: Secondary | ICD-10-CM | POA: Diagnosis not present

## 2017-05-26 DIAGNOSIS — R0609 Other forms of dyspnea: Secondary | ICD-10-CM | POA: Insufficient documentation

## 2017-06-02 ENCOUNTER — Encounter: Payer: Self-pay | Admitting: Internal Medicine

## 2017-06-02 ENCOUNTER — Ambulatory Visit (INDEPENDENT_AMBULATORY_CARE_PROVIDER_SITE_OTHER): Payer: Medicare HMO | Admitting: Internal Medicine

## 2017-06-02 VITALS — BP 137/78 | HR 65 | Ht 68.5 in | Wt 261.4 lb

## 2017-06-02 DIAGNOSIS — R0609 Other forms of dyspnea: Secondary | ICD-10-CM

## 2017-06-02 DIAGNOSIS — I350 Nonrheumatic aortic (valve) stenosis: Secondary | ICD-10-CM

## 2017-06-02 DIAGNOSIS — M7989 Other specified soft tissue disorders: Secondary | ICD-10-CM

## 2017-06-02 NOTE — Progress Notes (Addendum)
OFFICE NOTE  Chief Complaint:  Follow-up echo  Primary Care Physician: Lujean Amel, MD  HPI:  John Hanson is a pleasant 76 year old male is coming referred to me for preoperative clearance prior to back surgery. He said planning on undergoing spine surgery by Dr. Susa Day. He's recently been having some shortness of breath with exertion and underwent preoperative evaluation which demonstrated mildly abnormal EKG. I repeated the EKG in the office today which shows nonspecific lateral and inferior ST and T-wave changes. It is a normal sinus rhythm. He denies any chest pain but reports some mild shortness of breath. Recently was seen by his primary care provider who noted a systolic murmur. An echocardiogram was suggested that I do not see that it has been performed. He has no known cardiac history. There is no significant coronary disease in the family. He does have cardiac risk factors including hypertension, but no significant dyslipidemia or diabetes.  05/21/2017  John Hanson returns today for follow-up. This is a routine appointment. He last saw Almyra Deforest, [place], in March 2018 for follow-up of essential hypertension and dyspnea. Blood pressure is well controlled and he felt that his dyspnea was due to some deconditioning. No chest pain was noted. He recommended exercise. John Hanson since then has had some progressive dyspnea. His last echo was in 2016 which showed low normal LVEF 50-55% and mild aortic stenosis. He does have some peripheral vascular disease and is undergoing a venous insufficiency study with follow-up seeing Dr. Donnetta Hutching.  06/02/2017  John Hanson returns today for follow-up. He denies any worsening shortness of breath. We repeated his echo which shows systolic function and very mildly elevated aortic gradient. Is less than 10 mmHg and represents probably very mild if any aortic stenosis. He says is not significantly more short of breath and denies any chest  pain. Overall he is doing pretty well.  PMHx:  Past Medical History:  Diagnosis Date  . Anxiety   . Aortic stenosis    a. 06/2015 Echo: EF 50-55%, mild diff HK, Gr1 DD, mild AS w/ restricted mobility of R coronary and non-coronary cusp, mildly dil Ao root (23mm), mildly dil LA, nl RV fxn, mild TR/PR, PASP 86mmHg.  . Arthritis   . Depression   . Essential hypertension   . GERD (gastroesophageal reflux disease)   . Numbness and tingling    fingertips bilat   . Osteoarthritis    a. R>L hip pending R THA.  . Sleep apnea    a. pt states does not use CPAP machine   . Varicose veins     Past Surgical History:  Procedure Laterality Date  . ENDOVENOUS ABLATION SAPHENOUS VEIN W/ LASER Right 05-31-2014   EVLA  right gretaer saphenous vein by Curt Jews MD  . LUMBAR LAMINECTOMY/DECOMPRESSION MICRODISCECTOMY N/A 09/04/2015   Procedure: MICRO LUMBAR DECOMPRESSION L4 - L5,  L3 - L4 2 LEVELS;  Surgeon: Susa Day, MD;  Location: [facility];  Service: Orthopedics;  Laterality: N/A;  . TOTAL HIP ARTHROPLASTY Right 03/26/2016   Procedure: RIGHT TOTAL HIP ARTHROPLASTY ANTERIOR APPROACH;  Surgeon: Rod Can, MD;  Location: [facility];  Service: Orthopedics;  Laterality: Right;  . TOTAL HIP ARTHROPLASTY Left 07/30/2016   Procedure: TOTAL HIP ARTHROPLASTY ANTERIOR APPROACH;  Surgeon: Rod Can, MD;  Location: [facility];  Service: Orthopedics;  Laterality: Left;    FAMHx:  Family History  Problem Relation Age of Onset  . Kidney disease Mother     SOCHx:  reports that he quit smoking about 48 years ago. His smoking use included Cigars. He quit after 5.00 years of use. He has never used smokeless tobacco. He reports that he does not drink alcohol or use drugs.  ALLERGIES:  No Known Allergies  ROS: Pertinent items noted in HPI and remainder of comprehensive ROS otherwise negative.  HOME MEDS: Current Outpatient Prescriptions  Medication Sig Dispense Refill  . allopurinol (ZYLOPRIM) 300 MG  tablet Take 300 mg by mouth daily.    Marland Kitchen amLODipine (NORVASC) 10 MG tablet Take 10 mg by mouth daily.    Marland Kitchen aspirin EC 81 MG tablet Take 81 mg by mouth daily.    Marland Kitchen atorvastatin (LIPITOR) 10 MG tablet Take 10 mg by mouth daily.    . citalopram (CELEXA) 40 MG tablet Take 40 mg by mouth daily.    Marland Kitchen docusate sodium (COLACE) 100 MG capsule Take 1 capsule (100 mg total) by mouth 2 (two) times daily. 60 capsule 3  . furosemide (LASIX) 20 MG tablet Take 1 tablet (20 mg total) by mouth daily. 30 tablet 6  . IRBESARTAN PO Take by mouth daily.    Marland Kitchen labetalol (NORMODYNE) 300 MG tablet Take 300 mg by mouth 3 (three) times daily.    Marland Kitchen omeprazole (PRILOSEC) 40 MG capsule Take 40 mg by mouth daily.     No current facility-administered medications for this visit.     LABS/IMAGING: No results found for this or any previous visit (from the past 48 hour(s)). No results found.  WEIGHTS: Wt Readings from Last 3 Encounters:  06/02/17 261 lb 6.4 oz (118.6 kg)  05/21/17 258 lb (117 kg)  11/27/16 249 lb (112.9 kg)    VITALS: BP 137/78 (BP Location: Left Arm, Patient Position: Sitting, Cuff Size: Normal)   Pulse 65   Ht 5' 8.5" (1.74 m)   Wt 261 lb 6.4 oz (118.6 kg)   BMI 39.17 kg/m   EXAM: Deferred  EKG: Deferred  ASSESSMENT: 1. Progressive DOE - stable echo 2. Murmur -very mild aortic stenosis 3. Abnormal EKG with nonspecific ST and T changes 4. Venous insufficiency  PLAN: 1.   John Hanson says his shortness of breath is pretty stable. His echo shows no significant changes. Although he had some abnormalities on his EKG denies any chest pain. I don't think he needs any stress testing at this time. He has some venous insufficiency for which she is following up with Dr. Donnetta Hutching.  Follow-up with me in 6 months.  Pixie Casino, MD, [facility] Attending Cardiologist [place] C Zayne Draheim 06/02/2017, 9:56 AM

## 2017-06-02 NOTE — Patient Instructions (Signed)
Your physician wants you to follow-up in: 6 months with Dr. Hilty. You will receive a reminder letter in the mail two months in advance. If you don't receive a letter, please call our office to schedule the follow-up appointment.    

## 2017-06-10 DIAGNOSIS — M961 Postlaminectomy syndrome, not elsewhere classified: Secondary | ICD-10-CM | POA: Diagnosis not present

## 2017-06-10 DIAGNOSIS — M47816 Spondylosis without myelopathy or radiculopathy, lumbar region: Secondary | ICD-10-CM | POA: Diagnosis not present

## 2017-06-15 ENCOUNTER — Ambulatory Visit (INDEPENDENT_AMBULATORY_CARE_PROVIDER_SITE_OTHER): Payer: Medicare HMO | Admitting: Vascular Surgery

## 2017-06-15 ENCOUNTER — Ambulatory Visit (HOSPITAL_COMMUNITY)
Admission: RE | Admit: 2017-06-15 | Discharge: 2017-06-15 | Disposition: A | Discharge status: Home / Self Care | Payer: Medicare HMO | Source: Ambulatory Visit | Attending: Vascular Surgery | Admitting: Vascular Surgery

## 2017-06-15 ENCOUNTER — Encounter: Payer: Self-pay | Admitting: Vascular Surgery

## 2017-06-15 VITALS — BP 141/81 | HR 92 | Temp 98.0°F | Resp 20 | Ht 68.5 in | Wt 255.0 lb

## 2017-06-15 DIAGNOSIS — I872 Venous insufficiency (chronic) (peripheral): Secondary | ICD-10-CM | POA: Diagnosis not present

## 2017-06-15 DIAGNOSIS — M79605 Pain in left leg: Secondary | ICD-10-CM | POA: Insufficient documentation

## 2017-06-15 DIAGNOSIS — I87303 Chronic venous hypertension (idiopathic) without complications of bilateral lower extremity: Secondary | ICD-10-CM | POA: Diagnosis not present

## 2017-06-15 DIAGNOSIS — M7989 Other specified soft tissue disorders: Secondary | ICD-10-CM | POA: Insufficient documentation

## 2017-06-15 NOTE — Progress Notes (Signed)
[facility]ist of [facility]  Patient name: MIGUELANGEL KORN MRN: [id number] DOB: 1941/03/15 Sex: male  REASON FOR VISIT: Evaluation of swelling and discoloration of both lower extremities  HPI: John Hanson is a 76 y.o. male known to me from prior right great saphenous vein ablation on 05/31/2014. He did well following this. He does have some continued darkening and swelling on his distal ankles and calves. Concern regarding this and is here today for continued discussion. He does not have any prior history of peripheral vascular occlusive disease. He does have degenerative disc disease and has also had right upper placement.  Past Medical History:  Diagnosis Date  . Anxiety   . Aortic stenosis    a. 06/2015 Echo: EF 50-55%, mild diff HK, Gr1 DD, mild AS w/ restricted mobility of R coronary and non-coronary cusp, mildly dil Ao root (49mm), mildly dil LA, nl RV fxn, mild TR/PR, PASP 37mmHg.  . Arthritis   . Depression   . Essential hypertension   . GERD (gastroesophageal reflux disease)   . Numbness and tingling    fingertips bilat   . Osteoarthritis    a. R>L hip pending R THA.  . Sleep apnea    a. pt states does not use CPAP machine   . Varicose veins     Family History  Problem Relation Age of Onset  . Kidney disease Mother     SOCIAL HISTORY: Social History  Substance Use Topics  . Smoking status: Former Smoker    Years: 5.00    Types: Cigars    Quit date: 09/28/1968  . Smokeless tobacco: Never Used  . Alcohol use No    No Known Allergies  Current Outpatient Prescriptions  Medication Sig Dispense Refill  . allopurinol (ZYLOPRIM) 300 MG tablet Take 300 mg by mouth daily.    Marland Kitchen amLODipine (NORVASC) 10 MG tablet Take 10 mg by mouth daily.    Marland Kitchen aspirin EC 81 MG tablet Take 81 mg by mouth daily.    Marland Kitchen atorvastatin (LIPITOR) 10 MG tablet Take 10 mg by mouth daily.    . citalopram (CELEXA) 40 MG tablet Take 40  mg by mouth daily.    Marland Kitchen docusate sodium (COLACE) 100 MG capsule Take 1 capsule (100 mg total) by mouth 2 (two) times daily. 60 capsule 3  . furosemide (LASIX) 20 MG tablet Take 1 tablet (20 mg total) by mouth daily. 30 tablet 6  . IRBESARTAN PO Take by mouth daily.    Marland Kitchen labetalol (NORMODYNE) 300 MG tablet Take 300 mg by mouth 3 (three) times daily.    Marland Kitchen omeprazole (PRILOSEC) 40 MG capsule Take 40 mg by mouth daily.     No current facility-administered medications for this visit.     REVIEW OF SYSTEMS:  [X]  denotes positive finding, [ ]  denotes negative finding Cardiac  Comments:  Chest pain or chest pressure:    Shortness of breath upon exertion: x   Short of breath when lying flat:    Irregular heart rhythm:        Vascular    Pain in calf, thigh, or hip brought on by ambulation: x Orthopedic   Pain in feet at night that wakes you up from your sleep:     Blood clot in your veins:    Leg swelling:           PHYSICAL EXAM: Vitals:   06/15/17 1459  BP: (!) 141/81  Pulse: 92  Resp: 20  Temp: 98 F (36.7 C)  SpO2: 100%  Weight: 255 lb (115.7 kg)  Height: 5' 8.5" (1.74 m)    GENERAL: The patient is a well-nourished male, in no acute distress. The vital signs are documented above. CARDIOVASCULAR: Palpable radial and dorsalis pedis pulses bilaterally. PULMONARY: There is good air exchange  MUSCULOSKELETAL: There are no major deformities or cyanosis. NEUROLOGIC: No focal weakness or paresthesias are detected. SKIN: Darkening over his distal calf and ankle bilaterally with thickening of the skin and no ulceration PSYCHIATRIC: The patient has a normal affect.  DATA:  Venous duplex today reveals closure of his right great saphenous vein consistent with his ablation in September 2015. He does not have any dilatation or significant reflux in his left great saphenous vein. She does have reflux in his common femoral vein and femoral vein bilaterally.  MEDICAL ISSUES: Discussed the  significance of this patient. Explained that he does not have any evidence of arterial deficiency but does have venous stasis disease related to his deep venous reflux. Explained that this is not limb threatening. Also explained importance of elevation when possible and compression garments as well. He was reassured with this discussion will see [place] again on as-needed basis    Rosetta Posner, MD [facility]ists of [facility] Tel [phone] Pager [phone]

## 2017-06-24 DIAGNOSIS — M48062 Spinal stenosis, lumbar region with neurogenic claudication: Secondary | ICD-10-CM | POA: Diagnosis not present

## 2017-07-02 DIAGNOSIS — Z23 Encounter for immunization: Secondary | ICD-10-CM | POA: Diagnosis not present

## 2017-07-12 DIAGNOSIS — M109 Gout, unspecified: Secondary | ICD-10-CM | POA: Diagnosis not present

## 2017-07-12 DIAGNOSIS — M1990 Unspecified osteoarthritis, unspecified site: Secondary | ICD-10-CM | POA: Diagnosis not present

## 2017-07-12 DIAGNOSIS — Z79899 Other long term (current) drug therapy: Secondary | ICD-10-CM | POA: Diagnosis not present

## 2017-07-12 DIAGNOSIS — I10 Essential (primary) hypertension: Secondary | ICD-10-CM | POA: Diagnosis not present

## 2017-07-12 DIAGNOSIS — E7800 Pure hypercholesterolemia, unspecified: Secondary | ICD-10-CM | POA: Diagnosis not present

## 2017-07-20 DIAGNOSIS — M47816 Spondylosis without myelopathy or radiculopathy, lumbar region: Secondary | ICD-10-CM | POA: Diagnosis not present

## 2017-07-20 DIAGNOSIS — M48062 Spinal stenosis, lumbar region with neurogenic claudication: Secondary | ICD-10-CM | POA: Diagnosis not present

## 2017-07-26 DIAGNOSIS — R200 Anesthesia of skin: Secondary | ICD-10-CM | POA: Diagnosis not present

## 2017-07-26 DIAGNOSIS — J069 Acute upper respiratory infection, unspecified: Secondary | ICD-10-CM | POA: Diagnosis not present

## 2017-07-26 DIAGNOSIS — I10 Essential (primary) hypertension: Secondary | ICD-10-CM | POA: Diagnosis not present

## 2017-11-22 DIAGNOSIS — J209 Acute bronchitis, unspecified: Secondary | ICD-10-CM | POA: Diagnosis not present

## 2017-11-22 DIAGNOSIS — M2550 Pain in unspecified joint: Secondary | ICD-10-CM | POA: Diagnosis not present

## 2018-01-18 DIAGNOSIS — M5136 Other intervertebral disc degeneration, lumbar region: Secondary | ICD-10-CM | POA: Diagnosis not present

## 2018-01-18 DIAGNOSIS — M2550 Pain in unspecified joint: Secondary | ICD-10-CM | POA: Diagnosis not present

## 2018-01-18 DIAGNOSIS — E669 Obesity, unspecified: Secondary | ICD-10-CM | POA: Diagnosis not present

## 2018-01-18 DIAGNOSIS — Z6838 Body mass index (BMI) 38.0-38.9, adult: Secondary | ICD-10-CM | POA: Diagnosis not present

## 2018-01-18 DIAGNOSIS — M1009 Idiopathic gout, multiple sites: Secondary | ICD-10-CM | POA: Diagnosis not present

## 2018-01-18 DIAGNOSIS — M150 Primary generalized (osteo)arthritis: Secondary | ICD-10-CM | POA: Diagnosis not present

## 2018-01-19 DIAGNOSIS — M109 Gout, unspecified: Secondary | ICD-10-CM | POA: Diagnosis not present

## 2018-01-19 DIAGNOSIS — I77811 Abdominal aortic ectasia: Secondary | ICD-10-CM | POA: Diagnosis not present

## 2018-01-19 DIAGNOSIS — F419 Anxiety disorder, unspecified: Secondary | ICD-10-CM | POA: Diagnosis not present

## 2018-01-19 DIAGNOSIS — Z1159 Encounter for screening for other viral diseases: Secondary | ICD-10-CM | POA: Diagnosis not present

## 2018-01-19 DIAGNOSIS — Z7289 Other problems related to lifestyle: Secondary | ICD-10-CM | POA: Diagnosis not present

## 2018-01-19 DIAGNOSIS — Z79899 Other long term (current) drug therapy: Secondary | ICD-10-CM | POA: Diagnosis not present

## 2018-01-19 DIAGNOSIS — M5137 Other intervertebral disc degeneration, lumbosacral region: Secondary | ICD-10-CM | POA: Diagnosis not present

## 2018-01-19 DIAGNOSIS — Z0001 Encounter for general adult medical examination with abnormal findings: Secondary | ICD-10-CM | POA: Diagnosis not present

## 2018-01-19 DIAGNOSIS — E7800 Pure hypercholesterolemia, unspecified: Secondary | ICD-10-CM | POA: Diagnosis not present

## 2018-01-19 DIAGNOSIS — I10 Essential (primary) hypertension: Secondary | ICD-10-CM | POA: Diagnosis not present

## 2018-01-19 DIAGNOSIS — K219 Gastro-esophageal reflux disease without esophagitis: Secondary | ICD-10-CM | POA: Diagnosis not present

## 2018-02-20 ENCOUNTER — Encounter (HOSPITAL_COMMUNITY): Payer: Self-pay | Admitting: Emergency Medicine

## 2018-02-20 ENCOUNTER — Other Ambulatory Visit: Payer: Self-pay

## 2018-02-20 ENCOUNTER — Emergency Department (HOSPITAL_COMMUNITY): Payer: Medicare HMO

## 2018-02-20 ENCOUNTER — Emergency Department (HOSPITAL_COMMUNITY)
Admission: EM | Admit: 2018-02-20 | Discharge: 2018-02-20 | Disposition: A | Discharge status: Home / Self Care | Payer: Medicare HMO | Attending: Emergency Medicine | Admitting: Emergency Medicine

## 2018-02-20 DIAGNOSIS — Y9222 Religious institution as the place of occurrence of the external cause: Secondary | ICD-10-CM | POA: Diagnosis not present

## 2018-02-20 DIAGNOSIS — Z87891 Personal history of nicotine dependence: Secondary | ICD-10-CM | POA: Diagnosis not present

## 2018-02-20 DIAGNOSIS — Z96643 Presence of artificial hip joint, bilateral: Secondary | ICD-10-CM | POA: Diagnosis not present

## 2018-02-20 DIAGNOSIS — S86812A Strain of other muscle(s) and tendon(s) at lower leg level, left leg, initial encounter: Secondary | ICD-10-CM | POA: Diagnosis not present

## 2018-02-20 DIAGNOSIS — Y999 Unspecified external cause status: Secondary | ICD-10-CM | POA: Insufficient documentation

## 2018-02-20 DIAGNOSIS — Z7982 Long term (current) use of aspirin: Secondary | ICD-10-CM | POA: Diagnosis not present

## 2018-02-20 DIAGNOSIS — W108XXA Fall (on) (from) other stairs and steps, initial encounter: Secondary | ICD-10-CM | POA: Insufficient documentation

## 2018-02-20 DIAGNOSIS — Z79899 Other long term (current) drug therapy: Secondary | ICD-10-CM | POA: Diagnosis not present

## 2018-02-20 DIAGNOSIS — S8992XA Unspecified injury of left lower leg, initial encounter: Secondary | ICD-10-CM | POA: Diagnosis present

## 2018-02-20 DIAGNOSIS — I10 Essential (primary) hypertension: Secondary | ICD-10-CM | POA: Insufficient documentation

## 2018-02-20 DIAGNOSIS — M25462 Effusion, left knee: Secondary | ICD-10-CM | POA: Diagnosis not present

## 2018-02-20 DIAGNOSIS — M79605 Pain in left leg: Secondary | ICD-10-CM | POA: Diagnosis not present

## 2018-02-20 DIAGNOSIS — Y9301 Activity, walking, marching and hiking: Secondary | ICD-10-CM | POA: Insufficient documentation

## 2018-02-20 MED ORDER — IBUPROFEN 800 MG PO TABS
800.0000 mg | ORAL_TABLET | Freq: Once | ORAL | Status: AC
  Administered 2018-02-20: 800 mg via ORAL
  Filled 2018-02-20: qty 1

## 2018-02-20 MED ORDER — IBUPROFEN 600 MG PO TABS: 600.0000 mg | ORAL_TABLET | Freq: Four times a day (QID) | ORAL | 0 refills | Status: DC | PRN

## 2018-02-20 MED ORDER — HYDROCODONE-ACETAMINOPHEN 5-325 MG PO TABS: 1.0000 | ORAL_TABLET | Freq: Four times a day (QID) | ORAL | 0 refills | Status: DC | PRN

## 2018-02-20 NOTE — ED Triage Notes (Addendum)
Pt was walking down stairs at his [employer], missed a step and fell down on his side. Denies hitting head C/o of left side knee pain.

## 2018-02-20 NOTE — ED Provider Notes (Signed)
[facility] EMERGENCY DEPARTMENT Provider Note   CSN: [id number] Arrival date & time: 02/20/18  1303     History   Chief Complaint Chief Complaint  Patient presents with  . Fall  . Knee Pain    HPI John Hanson is a 77 y.o. male.  HPI  77 year old male, states he was walking [place] steps at [place] when he missed a step, caught himself on the next step but fell forward to the left feeling acute onset of pain in his left knee.  He was unable to get up and walk because of knee instability, he has mild to moderate persistent pain in the knee, he cannot straight leg raise, he denies any open wounds.  Denies any other injuries.  This occurred just prior to arrival.  Past Medical History:  Diagnosis Date  . Anxiety   . Aortic stenosis    a. 06/2015 Echo: EF 50-55%, mild diff HK, Gr1 DD, mild AS w/ restricted mobility of R coronary and non-coronary cusp, mildly dil Ao root (54mm), mildly dil LA, nl RV fxn, mild TR/PR, PASP 67mmHg.  . Arthritis   . Depression   . Essential hypertension   . GERD (gastroesophageal reflux disease)   . Numbness and tingling    fingertips bilat   . Osteoarthritis    a. R>L hip pending R THA.  . Sleep apnea    a. pt states does not use CPAP machine   . Varicose veins     Patient Active Problem List   Diagnosis Date Noted  . Swelling of limb 06/02/2017  . DOE (dyspnea on exertion) 05/21/2017  . Ascending aorta dilatation (HCC) 05/21/2017  . Primary osteoarthritis of left hip 07/30/2016  . Primary osteoarthritis of right hip 03/26/2016  . Essential hypertension   . Aortic valve stenosis   . Osteoarthritis   . Spinal stenosis of lumbar region 09/04/2015  . HTN (hypertension) 07/18/2015  . Murmur 07/18/2015  . Preoperative cardiovascular examination 07/18/2015  . Varicose veins of lower extremities with other complications [id number]/9628  . Pain in limb 02/08/2014    Past Surgical History:  Procedure Laterality Date  . ENDOVENOUS  ABLATION SAPHENOUS VEIN W/ LASER Right 05-31-2014   EVLA  right gretaer saphenous vein by Curt Jews MD  . LUMBAR LAMINECTOMY/DECOMPRESSION MICRODISCECTOMY N/A 09/04/2015   Procedure: MICRO LUMBAR DECOMPRESSION L4 - L5,  L3 - L4 2 LEVELS;  Surgeon: Susa Day, MD;  Location: [facility];  Service: Orthopedics;  Laterality: N/A;  . TOTAL HIP ARTHROPLASTY Right 03/26/2016   Procedure: RIGHT TOTAL HIP ARTHROPLASTY ANTERIOR APPROACH;  Surgeon: Rod Can, MD;  Location: [facility];  Service: Orthopedics;  Laterality: Right;  . TOTAL HIP ARTHROPLASTY Left 07/30/2016   Procedure: TOTAL HIP ARTHROPLASTY ANTERIOR APPROACH;  Surgeon: Rod Can, MD;  Location: [facility];  Service: Orthopedics;  Laterality: Left;        Home Medications    Prior to Admission medications   Medication Sig Start Date End Date Taking? Authorizing Provider  allopurinol (ZYLOPRIM) 300 MG tablet Take 300 mg by mouth daily.    [provider]  amLODipine (NORVASC) 10 MG tablet Take 10 mg by mouth daily.    [provider]  aspirin EC 81 MG tablet Take 81 mg by mouth daily.    [provider]  atorvastatin (LIPITOR) 10 MG tablet Take 10 mg by mouth daily. 05/20/17   [provider]  citalopram (CELEXA) 40 MG tablet Take 40 mg by mouth daily.  [provider]  docusate sodium (COLACE) 100 MG capsule Take 1 capsule (100 mg total) by mouth 2 (two) times daily. 03/27/16   Swinteck, Aaron Edelman, MD  furosemide (LASIX) 20 MG tablet Take 1 tablet (20 mg total) by mouth daily. 03/24/16   Theora Gianotti, NP  HYDROcodone-acetaminophen (NORCO/VICODIN) 5-325 MG tablet Take 1 tablet by mouth every 6 (six) hours as needed for up to 2 days for moderate pain. 02/20/18 02/22/18  Noemi Chapel, MD  ibuprofen (ADVIL,MOTRIN) 600 MG tablet Take 1 tablet (600 mg total) by mouth every 6 (six) hours as needed. 02/20/18   Noemi Chapel, MD  IRBESARTAN PO Take by mouth daily.    [provider]    labetalol (NORMODYNE) 300 MG tablet Take 300 mg by mouth 3 (three) times daily.    [provider]  omeprazole (PRILOSEC) 40 MG capsule Take 40 mg by mouth daily.    [provider]    Family History Family History  Problem Relation Age of Onset  . Kidney disease Mother     Social History Social History   Tobacco Use  . Smoking status: Former Smoker    Years: 5.00    Types: Cigars    Last attempt to quit: 09/28/1968    Years since quitting: 49.4  . Smokeless tobacco: Never Used  Substance Use Topics  . Alcohol use: No  . Drug use: No     Allergies   Patient has no known allergies.   Review of Systems Review of Systems  Respiratory: Negative for cough and shortness of breath.   Musculoskeletal: Positive for arthralgias. Negative for back pain.  Neurological: Negative for weakness, numbness and headaches.     Physical Exam Updated Vital Signs BP (!) 154/89 (BP Location: Right Arm)   Pulse 66   Temp 97.7 F (36.5 C) (Oral)   Resp 18   Ht 5\' 8"  (1.727 m)   Wt 113.4 kg (250 lb)   SpO2 96%   BMI 38.01 kg/m   Physical Exam  Constitutional: He appears well-developed and well-nourished.  HENT:  Head: Normocephalic and atraumatic.  Eyes: Conjunctivae are normal. Right eye exhibits no discharge. Left eye exhibits no discharge.  Cardiovascular:  Murmur heard. Pulmonary/Chest: Effort normal. No respiratory distress.  Musculoskeletal:  Bilateral lower extremities are symmetrical in appearance however his left lower extremity has a defect just superior to the patella, he is unable to straight leg raise, he has a free moving patella with supple joints diffusely, he can flex the knee but cannot extend the knee.  Joints examined above and below the knee with normal range of motion and no tenderness.  There is no joint laxity to anterior and posterior stress nor medial and lateral stress.  Neurological: He is alert. Coordination normal.  Normal strength  and sensation shy of being able to straight leg raise secondary to tenderness defect  Skin: Skin is warm and dry. No rash noted. He is not diaphoretic. No erythema.  Psychiatric: He has a normal mood and affect.  Nursing note and vitals reviewed.    ED Treatments / Results  Labs (all labs ordered are listed, but only abnormal results are displayed) Labs Reviewed - No data to display  EKG None  Radiology Dg Knee Complete 4 Views Left  Result Date: 02/20/2018 CLINICAL DATA:  [place] at [place].  Left anterior knee pain. EXAM: LEFT KNEE - COMPLETE 4+ VIEW COMPARISON:  None. FINDINGS: There small bone fragments, which lie above the  patella, likely from a fractured enthesophyte at the quadriceps insertion. No other evidence of a fracture. Knee joint is normally spaced and aligned. Small joint effusion. IMPRESSION: 1. Findings are consistent with fracture of an enthesophyte at the quadriceps tendon insertion on the superior patella. There is a small associated joint effusion. Consider a quadriceps tendon disruption if the patient has difficulty with the extension. This could be further assessed with knee MRI. 2. No other evidence of a fracture. Electronically Signed   By: Lajean Manes M.D.   On: 02/20/2018 13:32    Procedures Procedures (including critical care time)  Medications Ordered in ED Medications  ibuprofen (ADVIL,MOTRIN) tablet 800 mg (800 mg Oral Given 02/20/18 1325)     Initial Impression / Assessment and Plan / ED Course  I have reviewed the triage vital signs and the nursing notes.  Pertinent labs & imaging results that were available during my care of the patient were reviewed by me and considered in my medical decision making (see chart for details).  Clinical Course as of Feb 20 1358  Sun Feb 20, 2018  1353 Discussed with orthopedic surgery physician assistant at 1:50 PM, he agrees with outpatient follow-up and the plan of immobilization   [BM]    Clinical  Course User Index [BM] Noemi Chapel, MD   The patient has a ruptured quad tendon on exam, x-ray to reveal any other further injuries, immobilized with knee immobilizer crutches and have follow-up with orthopedics.  Patient agreeable.  Nursing placed knee immobilizer, neurovascular status reexamined after this was placed and was normal, patient given instructions and expressed understanding to follow-up  Pt agreeable - and wants to follow up local with Dr. Aline Brochure - family member will take him there to take care of this - was given phone number for Dr. Stann Mainland (on call) as needed  Final Clinical Impressions(s) / ED Diagnoses   Final diagnoses:  Rupture of kneecap tendon, left, initial encounter    ED Discharge Orders        Ordered    HYDROcodone-acetaminophen (NORCO/VICODIN) 5-325 MG tablet  Every 6 hours PRN     02/20/18 1358    ibuprofen (ADVIL,MOTRIN) 600 MG tablet  Every 6 hours PRN     02/20/18 1358       Noemi Chapel, MD 02/20/18 [phone]

## 2018-02-20 NOTE — Discharge Instructions (Signed)
Keep your leg elevated, keep an ice pack on your knee above the clothing, your x-ray shows that there is a rupture of the tendon of your knee which will need repair.  I have spoken with the orthopedic team and they are willing to see you in the office.  Please see the phone number attached and follow-up very closely.  You should be seen this week  Ibuprofen 3 times a day, hydrocodone only for severe pain

## 2018-02-22 ENCOUNTER — Encounter: Payer: Self-pay | Admitting: Orthopedic Surgery

## 2018-02-22 ENCOUNTER — Ambulatory Visit: Payer: Medicare HMO | Admitting: Orthopedic Surgery

## 2018-02-22 VITALS — BP 153/77 | HR 69 | Ht 68.0 in

## 2018-02-22 DIAGNOSIS — S76112A Strain of left quadriceps muscle, fascia and tendon, initial encounter: Secondary | ICD-10-CM | POA: Diagnosis not present

## 2018-02-22 DIAGNOSIS — I350 Nonrheumatic aortic (valve) stenosis: Secondary | ICD-10-CM

## 2018-02-22 MED ORDER — HYDROCODONE-ACETAMINOPHEN 5-325 MG PO TABS: 1.0000 | ORAL_TABLET | Freq: Four times a day (QID) | ORAL | 0 refills | Status: DC | PRN

## 2018-02-22 NOTE — Progress Notes (Addendum)
NEW PATIENT OFFICE VISIT   Chief Complaint  Patient presents with  . Knee Pain    left knee pain since fall 02/20/18 fall extensor mechanism injury     MEDICAL DECISION SECTION  xrays ordered? no  My independent reading of xrays: X-rays were done at the hospital at the patient's ER visit on 26 April they show a soft tissue defect between the patella and quadriceps tendon indicative of quadriceps tendon rupture in light of the patient's clinical symptoms   Encounter Diagnoses  Name Primary?  . Quadriceps tendon rupture, left, initial encounter Yes  . Aortic valve stenosis, etiology of cardiac valve disease unspecified      PLAN:  The patient will require surgery to repair the quadriceps tendon of the left lower extremity  He will stay in a knee immobilizer weight-bear as tolerated until then  The patient has dyspnea on exertion he will require a cardiology consult with anticipation of doing surgery on Friday of possible.  So he does have some significant medical problems which make his surgery a little more complicated from an anesthetic point of view although he did tolerate back surgery and total joint surgery   Meds ordered this encounter  Medications  . HYDROcodone-acetaminophen (NORCO/VICODIN) 5-325 MG tablet    Sig: Take 1 tablet by mouth every 6 (six) hours as needed for moderate pain.    Dispense:  20 tablet    Refill:  0    FURTHER WORK UP PLANNED yes, cardiology clearance consultation   Chief Complaint  Patient presents with  . Knee Pain    left knee pain since fall 02/20/18 fall ? patella tendon injury     77 year old male presents for evaluation of a left knee injury  The patient has pain located in his left quadriceps its quality is dull with severity is mild its timing is constant duration 2 days context the patient was walking fell on a flexed knee felt a pop was unable to then extend his knee or place weight on his knee modifying factors the patient  had a knee immobilizer placed it is hurting his knee because the metal is bent into the popliteal fossa associated symptoms include bruising and swelling   Review of Systems  Constitutional: Negative.   HENT: Positive for hearing loss.   Eyes: Negative.   Respiratory: Positive for shortness of breath.   Cardiovascular: Positive for leg swelling.  Gastrointestinal: Negative for diarrhea.  Genitourinary: Negative for hematuria.  Musculoskeletal: Positive for back pain and joint pain.  Skin: Negative.   Neurological: Positive for focal weakness. Negative for dizziness, sensory change and weakness.  Endo/Heme/Allergies: Negative for environmental allergies and polydipsia. Does not bruise/bleed easily.  Psychiatric/Behavioral: Negative.      Past Medical History:  Diagnosis Date  . Anxiety   . Aortic stenosis    a. 06/2015 Echo: EF 50-55%, mild diff HK, Gr1 DD, mild AS w/ restricted mobility of R coronary and non-coronary cusp, mildly dil Ao root (88mm), mildly dil LA, nl RV fxn, mild TR/PR, PASP 61mmHg.  . Arthritis   . Depression   . Essential hypertension   . GERD (gastroesophageal reflux disease)   . Numbness and tingling    fingertips bilat   . Osteoarthritis    a. R>L hip pending R THA.  . Sleep apnea    a. pt states does not use CPAP machine   . Varicose veins     Past Surgical History:  Procedure Laterality Date  . ENDOVENOUS ABLATION  SAPHENOUS VEIN W/ LASER Right 05-31-2014   EVLA  right gretaer saphenous vein by Curt Jews MD  . LUMBAR LAMINECTOMY/DECOMPRESSION MICRODISCECTOMY N/A 09/04/2015   Procedure: MICRO LUMBAR DECOMPRESSION L4 - L5,  L3 - L4 2 LEVELS;  Surgeon: Susa Day, MD;  Location: [facility];  Service: Orthopedics;  Laterality: N/A;  . TOTAL HIP ARTHROPLASTY Right 03/26/2016   Procedure: RIGHT TOTAL HIP ARTHROPLASTY ANTERIOR APPROACH;  Surgeon: Rod Can, MD;  Location: [facility];  Service: Orthopedics;  Laterality: Right;  . TOTAL HIP ARTHROPLASTY Left  07/30/2016   Procedure: TOTAL HIP ARTHROPLASTY ANTERIOR APPROACH;  Surgeon: Rod Can, MD;  Location: [facility];  Service: Orthopedics;  Laterality: Left;    Family History  Problem Relation Age of Onset  . Kidney disease Mother    Social History   Tobacco Use  . Smoking status: Former Smoker    Years: 5.00    Types: Cigars    Last attempt to quit: 09/28/1968    Years since quitting: 49.4  . Smokeless tobacco: Never Used  Substance Use Topics  . Alcohol use: No  . Drug use: No    No Known Allergies   Current Meds  Medication Sig  . amLODipine (NORVASC) 10 MG tablet Take 10 mg by mouth daily.  Marland Kitchen aspirin EC 81 MG tablet Take 81 mg by mouth daily.  Marland Kitchen atorvastatin (LIPITOR) 10 MG tablet Take 10 mg by mouth daily.  Marland Kitchen docusate sodium (COLACE) 100 MG capsule Take 1 capsule (100 mg total) by mouth 2 (two) times daily.  . furosemide (LASIX) 20 MG tablet Take 1 tablet (20 mg total) by mouth daily.  Marland Kitchen ibuprofen (ADVIL,MOTRIN) 600 MG tablet Take 1 tablet (600 mg total) by mouth every 6 (six) hours as needed.  . IRBESARTAN PO Take by mouth daily.  Marland Kitchen labetalol (NORMODYNE) 300 MG tablet Take 300 mg by mouth 3 (three) times daily.  Marland Kitchen omeprazole (PRILOSEC) 40 MG capsule Take 40 mg by mouth daily.  . traZODone (DESYREL) 150 MG tablet   . [DISCONTINUED] HYDROcodone-acetaminophen (NORCO/VICODIN) 5-325 MG tablet Take 1 tablet by mouth every 6 (six) hours as needed for up to 2 days for moderate pain.    BP (!) 153/77   Pulse 69   Ht 5\' 8"  (1.727 m)   BMI 38.01 kg/m   Physical Exam  Constitutional: He is oriented to person, place, and time. He appears well-developed and well-nourished. No distress.  HENT:  Head: Normocephalic and atraumatic.  Right Ear: No drainage.  Left Ear: No drainage.  Nose: No mucosal edema or rhinorrhea.  Mouth/Throat: Oropharynx is clear and moist and mucous membranes are normal. No oropharyngeal exudate.  Cardiovascular: Intact distal pulses.  Mild to moderate  bilateral peripheral edema distal pulses normal both lower extremities mild swelling bilaterally  Pulmonary/Chest: Effort normal. No accessory muscle usage or stridor. No bradypnea. No respiratory distress. He has no decreased breath sounds.  Lymphadenopathy:       Right: No inguinal adenopathy present.       Left: No inguinal adenopathy present.  Neurological: He is alert and oriented to person, place, and time. Gait abnormal.  Coordination test upper extremities alternating movements rapidly normal both sides deep tendon reflexes in the left lower extremity deferred because of the knee injury right side normal upper extremities 2+ normal  Sensation normal all 4 extremities  Abnormal gait left side limping requiring immobilizer and a supportive device    Skin: Skin is warm, dry and intact. He is not  diaphoretic.  All 4 extremities had normal skin, bruising was noted over the left knee in the subcutaneous region  Psychiatric: He has a normal mood and affect. His behavior is normal. Judgment and thought content normal.      Ortho Exam  The right and left upper extremity again skin was normal strength and muscle tone normal should be able to handle a walker after surgery no instability of any joint all joints had normal range of motion he had mild degenerative changes in the interphalangeal joints of the hand inspection and palpation otherwise normal with no tenderness masses or effusions  The right lower extremity had normal alignment normal stability strength and muscle tone with functional range of motion knee flexion external rotation of the hip but internally rotates to neutral  The left lower extremity was also lying in external rotation tenderness and palpable defect at the quadriceps tendon and patellar junction no active motion no knee extension against resistance abnormal attempt at straight leg raise joint stability confirmed with ligament testing muscle tone otherwise normal again  skin was normal except for the bruising  Arther Abbott, MD  02/22/2018 5:16 PM

## 2018-02-23 ENCOUNTER — Ambulatory Visit (HOSPITAL_COMMUNITY)
Admission: RE | Admit: 2018-02-23 | Discharge: 2018-02-23 | Disposition: A | Discharge status: Home / Self Care | Payer: Medicare HMO | Source: Ambulatory Visit | Attending: Orthopedic Surgery | Admitting: Orthopedic Surgery

## 2018-02-23 ENCOUNTER — Other Ambulatory Visit: Payer: Self-pay

## 2018-02-23 ENCOUNTER — Encounter (HOSPITAL_COMMUNITY)
Admission: RE | Admit: 2018-02-23 | Discharge: 2018-02-23 | Disposition: A | Discharge status: Home / Self Care | Payer: Medicare HMO | Source: Ambulatory Visit | Attending: Orthopedic Surgery | Admitting: Orthopedic Surgery

## 2018-02-23 ENCOUNTER — Encounter (HOSPITAL_COMMUNITY): Payer: Self-pay

## 2018-02-23 DIAGNOSIS — Z01818 Encounter for other preprocedural examination: Secondary | ICD-10-CM | POA: Insufficient documentation

## 2018-02-23 DIAGNOSIS — Z01812 Encounter for preprocedural laboratory examination: Secondary | ICD-10-CM | POA: Insufficient documentation

## 2018-02-23 LAB — CBC WITH DIFFERENTIAL/PLATELET
Basophils Absolute: 0 10*3/uL (ref 0.0–0.1)
Basophils Relative: 0 %
EOS PCT: 2 %
Eosinophils Absolute: 0.1 10*3/uL (ref 0.0–0.7)
HEMATOCRIT: 36 % — AB (ref 39.0–52.0)
Hemoglobin: 11.7 g/dL — ABNORMAL LOW (ref 13.0–17.0)
LYMPHS ABS: 1.3 10*3/uL (ref 0.7–4.0)
Lymphocytes Relative: 21 %
MCH: 33.1 pg (ref 26.0–34.0)
MCHC: 32.5 g/dL (ref 30.0–36.0)
MCV: 102 fL — ABNORMAL HIGH (ref 78.0–100.0)
MONO ABS: 0.5 10*3/uL (ref 0.1–1.0)
MONOS PCT: 8 %
Neutro Abs: 4.1 10*3/uL (ref 1.7–7.7)
Neutrophils Relative %: 69 %
PLATELETS: 165 10*3/uL (ref 150–400)
RBC: 3.53 MIL/uL — ABNORMAL LOW (ref 4.22–5.81)
RDW: 14 % (ref 11.5–15.5)
WBC: 6 10*3/uL (ref 4.0–10.5)

## 2018-02-23 LAB — BASIC METABOLIC PANEL
Anion gap: 8 (ref 5–15)
BUN: 22 mg/dL — AB (ref 6–20)
CHLORIDE: 105 mmol/L (ref 101–111)
CO2: 27 mmol/L (ref 22–32)
Calcium: 9.1 mg/dL (ref 8.9–10.3)
Creatinine, Ser: 1.25 mg/dL — ABNORMAL HIGH (ref 0.61–1.24)
GFR calc Af Amer: >60 mL/min (ref >60)
GFR calc non Af Amer: 54 mL/min — ABNORMAL LOW (ref >60)
GLUCOSE: 106 mg/dL — AB (ref 65–99)
Potassium: 4 mmol/L (ref 3.5–5.1)
Sodium: 140 mmol/L (ref 135–145)

## 2018-02-23 NOTE — Patient Instructions (Signed)
   Your procedure is scheduled on: 02/24/2018  Report to [facility] at  6;15   AM.  Call this number if you have problems the morning of surgery: [phone]   Remember:   Do not drink or eat food:After Midnight.  :  Take these medicines the morning of surgery with A SIP OF WATER: Amlodipine, Celexa, Labetalol, omeprazole, and hydrocodone(If needed)   Do not wear jewelry, make-up or nail polish.  Do not wear lotions, powders, or perfumes. You may wear deodorant.  Do not shave 48 hours prior to surgery. Men may shave face and neck.  Do not bring valuables to the hospital.  Contacts, dentures or bridgework may not be worn into surgery.  Leave suitcase in the car. After surgery it may be brought to your room.  For patients admitted to the hospital, checkout time is 11:00 AM the day of discharge.   Patients discharged the day of surgery will not be allowed to drive home.    Special Instructions: Shower using CHG night before surgery and shower the day of surgery use CHG.  Use special wash - you have one bottle of CHG for all showers.  You should use approximately 1/2 of the bottle for each shower.

## 2018-02-23 NOTE — H&P (Addendum)
Admission History and Physical for Outpatient Surgery       Chief Complaint  Patient presents with  . Knee Pain      left knee pain since fall 02/20/18 fall extensor mechanism injury        MEDICAL DECISION SECTION  xrays ordered? no   My independent reading of xrays: X-rays were done at the hospital at the patient's ER visit on 26 April they show a soft tissue defect between the patella and quadriceps tendon indicative of quadriceps tendon rupture in light of the patient's clinical symptoms         Encounter Diagnoses  Name Primary?  . Quadriceps tendon rupture, left, initial encounter Yes  . Aortic valve stenosis, etiology of cardiac valve disease unspecified          PLAN:  The patient will require surgery to repair the quadriceps tendon of the left lower extremity   He will stay in a knee immobilizer weight-bear as tolerated until then   The patient has dyspnea on exertion he will require a cardiology consult with anticipation of doing surgery on Friday of possible.   So he does have some significant medical problems which make his surgery a little more complicated from an anesthetic point of view although he did tolerate back surgery and total joint surgery         Meds ordered this encounter  Medications  . HYDROcodone-acetaminophen (NORCO/VICODIN) 5-325 MG tablet      Sig: Take 1 tablet by mouth every 6 (six) hours as needed for moderate pain.      Dispense:  20 tablet      Refill:  0      FURTHER WORK UP PLANNED yes, cardiology clearance consultation         Chief Complaint  Patient presents with  . Knee Pain      left knee pain since fall 02/20/18 fall ? patella tendon injury       77 year old male presents for evaluation of a left knee injury   The patient has pain located in his left quadriceps its quality is dull with severity is mild its timing is constant duration 2 days context the patient was walking fell on a flexed knee felt a pop was unable to  then extend his knee or place weight on his knee modifying factors the patient had a knee immobilizer placed it is hurting his knee because the metal is bent into the popliteal fossa associated symptoms include bruising and swelling     Review of Systems  Constitutional: Negative.   HENT: Positive for hearing loss.   Eyes: Negative.   Respiratory: Positive for shortness of breath.   Cardiovascular: Positive for leg swelling.  Gastrointestinal: Negative for diarrhea.  Genitourinary: Negative for hematuria.  Musculoskeletal: Positive for back pain and joint pain.  Skin: Negative.   Neurological: Positive for focal weakness. Negative for dizziness, sensory change and weakness.  Endo/Heme/Allergies: Negative for environmental allergies and polydipsia. Does not bruise/bleed easily.  Psychiatric/Behavioral: Negative.             Past Medical History:  Diagnosis Date  . Anxiety    . Aortic stenosis      a. 06/2015 Echo: EF 50-55%, mild diff HK, Gr1 DD, mild AS w/ restricted mobility of R coronary and non-coronary cusp, mildly dil Ao root (26mm), mildly dil LA, nl RV fxn, mild TR/PR, PASP 93mmHg.  . Arthritis    . Depression    . Essential hypertension    .  GERD (gastroesophageal reflux disease)    . Numbness and tingling      fingertips bilat   . Osteoarthritis      a. R>L hip pending R THA.  . Sleep apnea      a. pt states does not use CPAP machine   . Varicose veins             Past Surgical History:  Procedure Laterality Date  . ENDOVENOUS ABLATION SAPHENOUS VEIN W/ LASER Right 05-31-2014    EVLA  right gretaer saphenous vein by Curt Jews MD  . LUMBAR LAMINECTOMY/DECOMPRESSION MICRODISCECTOMY N/A 09/04/2015    Procedure: MICRO LUMBAR DECOMPRESSION L4 - L5,  L3 - L4 2 LEVELS;  Surgeon: Susa Day, MD;  Location: [facility];  Service: Orthopedics;  Laterality: N/A;  . TOTAL HIP ARTHROPLASTY Right 03/26/2016    Procedure: RIGHT TOTAL HIP ARTHROPLASTY ANTERIOR APPROACH;  Surgeon:  Rod Can, MD;  Location: [facility];  Service: Orthopedics;  Laterality: Right;  . TOTAL HIP ARTHROPLASTY Left 07/30/2016    Procedure: TOTAL HIP ARTHROPLASTY ANTERIOR APPROACH;  Surgeon: Rod Can, MD;  Location: [facility];  Service: Orthopedics;  Laterality: Left;           Family History  Problem Relation Age of Onset  . Kidney disease Mother      Social History         Tobacco Use  . Smoking status: Former Smoker      Years: 5.00      Types: Cigars      Last attempt to quit: 09/28/1968      Years since quitting: 49.4  . Smokeless tobacco: Never Used  Substance Use Topics  . Alcohol use: No  . Drug use: No      No Known Allergies     Active Medications      Current Meds  Medication Sig  . amLODipine (NORVASC) 10 MG tablet Take 10 mg by mouth daily.  Marland Kitchen aspirin EC 81 MG tablet Take 81 mg by mouth daily.  Marland Kitchen atorvastatin (LIPITOR) 10 MG tablet Take 10 mg by mouth daily.  Marland Kitchen docusate sodium (COLACE) 100 MG capsule Take 1 capsule (100 mg total) by mouth 2 (two) times daily.  . furosemide (LASIX) 20 MG tablet Take 1 tablet (20 mg total) by mouth daily.  Marland Kitchen ibuprofen (ADVIL,MOTRIN) 600 MG tablet Take 1 tablet (600 mg total) by mouth every 6 (six) hours as needed.  . IRBESARTAN PO Take by mouth daily.  Marland Kitchen labetalol (NORMODYNE) 300 MG tablet Take 300 mg by mouth 3 (three) times daily.  Marland Kitchen omeprazole (PRILOSEC) 40 MG capsule Take 40 mg by mouth daily.  . traZODone (DESYREL) 150 MG tablet    . [DISCONTINUED] HYDROcodone-acetaminophen (NORCO/VICODIN) 5-325 MG tablet Take 1 tablet by mouth every 6 (six) hours as needed for up to 2 days for moderate pain.        BP (!) 153/77   Pulse 69   Ht 5\' 8"  (1.727 m)   BMI 38.01 kg/m    Physical Exam  Constitutional: He is oriented to person, place, and time. He appears well-developed and well-nourished. No distress.  Cardiovascular: Intact distal pulses.  Mild to moderate bilateral peripheral edema intact distal pulses normal both  lower extremities mild swelling bilaterally  Lymphadenopathy:       Right: No inguinal adenopathy present.       Left: No inguinal adenopathy present.  Neurological: He is alert and oriented to person, place, and time. Gait abnormal.  Coordination test upper extremities alternating movements rapidly normal both sides deep tendon reflexes in the left lower extremity deferred because of the knee injury right side normal upper extremities 2+ normal  Sensation normal all 4 extremities  Abnormal gait left side limping requiring immobilizer and a supportive device    Skin: Skin is warm, dry and intact. He is not diaphoretic.  All 4 extremities had normal skin, bruising was noted over the left knee in the subcutaneous region  Psychiatric: He has a normal mood and affect. His behavior is normal. Judgment and thought content normal.          Ortho Exam   The right and left upper extremity again skin was normal strength and muscle tone normal should be able to handle a walker after surgery no instability of any joint all joints had normal range of motion he had mild degenerative changes in the interphalangeal joints of the hand inspection and palpation otherwise normal with no tenderness masses or effusions   The right lower extremity had normal alignment normal stability strength and muscle tone with functional range of motion knee flexion external rotation of the hip but internally rotates to neutral   The left lower extremity was also lying in external rotation tenderness and palpable defect at the quadriceps tendon and patellar junction no active motion no knee extension against resistance abnormal attempt at straight leg raise joint stability confirmed with ligament testing muscle tone otherwise normal again skin was normal except for the bruising   Arther Abbott, MD   2:44 PM 02/23/2018

## 2018-02-23 NOTE — Patient Instructions (Signed)
John Hanson  02/23/2018     @PREFPERIOPPHARMACY @   Your procedure is scheduled on 02/23/2018.  Report to [facility] at 6:15 A.M.  Call this number if you have problems the morning of surgery:  [phone]   Remember: Nothing to eat or drink after midnight tonight     Take these medicines the morning of surgery with A SIP OF WATER Allopurinol, Amlodipine, Celexa, Hydrocodone, Labetalol, Prilosec    Do not wear jewelry, make-up or nail polish.  Do not wear lotions, powders, or perfumes, or deodorant.  Do not shave 48 hours prior to surgery.  Men may shave face and neck.  Do not bring valuables to the hospital.  [facility] is not responsible for any belongings or valuables.  Contacts, dentures or bridgework may not be worn into surgery.  Leave your suitcase in the car.  After surgery it may be brought to your room.  For patients admitted to the hospital, discharge time will be determined by your treatment team.  Patients discharged the day of surgery will not be allowed to drive home.    Please read over the following fact sheets that you were given. Surgical Site Infection Prevention and Anesthesia Post-op Instructions     PATIENT INSTRUCTIONS POST-ANESTHESIA  IMMEDIATELY FOLLOWING SURGERY:  Do not drive or operate machinery for the first twenty four hours after surgery.  Do not make any important decisions for twenty four hours after surgery or while taking narcotic pain medications or sedatives.  If you develop intractable nausea and vomiting or a severe headache please notify your doctor immediately.  FOLLOW-UP:  Please make an appointment with your surgeon as instructed. You do not need to follow up with anesthesia unless specifically instructed to do so.  WOUND CARE INSTRUCTIONS (if applicable):  Keep a dry clean dressing on the anesthesia/puncture wound site if there is drainage.  Once the wound has quit draining you may leave it open to air.  Generally you  should leave the bandage intact for twenty four hours unless there is drainage.  If the epidural site drains for more than 36-48 hours please call the anesthesia department.  QUESTIONS?:  Please feel free to call your physician or the hospital operator if you have any questions, and they will be happy to assist you.      Tendon Repair Tendon repair is surgery to fix a tendon that is torn (ruptured). The repair is done by reconnecting the torn ends of the tendon. Tendons are like cords, and they connect muscles to bones. Tendonsthat commonly rupture and need surgical repair include those in the:  Knee area (patellar tendon or quadriceps tendon).  Elbow (triceps tendon).  Shoulder (biceps tendon).  Fingers (flexor tendons).  Ankle (peroneal tendon or posterior tibialtendon).  You may need to wear a cast or splint for a few weeks after surgery while the injury heals. Tell a health care provider about:  Any allergies you have.  All medicines you are taking, including vitamins, herbs, eye drops, creams, and over-the-counter medicines.  Any problems you or family members have had with anesthetic medicines.  Any blood disorders you have.  Any surgeries you have had.  Any medical conditions you have. What are the risks? Generally, this is a safe procedure. However, problems can occur and include:  Bleeding or infection at the site of the surgery.  Stiffness or loss of function at the joint.  Trouble breathing.  Drug reactions.  Buildup of scar tissue.  What  happens before the procedure?  Ask your health care provider about: ? Changing or stopping your regular medicines. This is especially important if you are taking diabetes medicines or blood thinners. ? Taking medicines such as aspirin and ibuprofen. These medicines can thin your blood. Do not take these medicines before your procedure if your health care provider asks you not to.  Do not eat or drink anything after  midnight on the night before the procedure or as directed by your health care provider.  Plan to have someone take you home after the procedure. What happens during the procedure?  An IV tube may be inserted into a vein.  You will be given one of the following: ? A medicine that numbs the area (local anesthetic). ? A medicine that makes you go to sleep (general anesthetic).  The surgeon will make a small surgical cut (incision) in the skin over the damaged tendon.  If there is enough healthy tendon to work with, the surgeon will reconnect the torn ends of the tendon.  If there is not enough healthy tissue, the surgeon may use a piece of tendon from another part of your body to reconnect the torn tendon.  The tendon will be attached to the surrounding tissues if necessary.  The incision will be closed with stitches (sutures). A bandage (dressing) will be applied to cover the incision. What happens after the procedure?  You will be taken to a recovery room.  Your blood pressure, heart rate, breathing rate, and blood oxygen level will be monitored often until the medicines you were given have worn off.  You may have to wear a brace, splint, or cast to protect the healing tendon. This information is not intended to replace advice given to you by your health care provider. Make sure you discuss any questions you have with your health care provider. Document Released: 03/10/2001 Document Revised: 05/14/2016 Document Reviewed: 02/21/2014 Elsevier Interactive Patient Education  [employer].

## 2018-02-23 NOTE — Addendum Note (Signed)
Addended by: Arther Abbott E on: 02/23/2018 09:30 AM   Modules accepted: Orders, SmartSet

## 2018-02-23 NOTE — Progress Notes (Signed)
John Hanson is at acceptable risk for surgery and you can proceed without further cardiac testing. Can hold aspirin for 7 days prior to procedure if necessary.  Thanks.  Dr. Debara Pickett

## 2018-02-24 ENCOUNTER — Ambulatory Visit (HOSPITAL_COMMUNITY): Payer: Medicare HMO | Admitting: Anesthesiology

## 2018-02-24 ENCOUNTER — Encounter (HOSPITAL_COMMUNITY)
Admission: RE | Disposition: A | Discharge status: Home / Self Care | Payer: Self-pay | Source: Ambulatory Visit | Attending: Orthopedic Surgery

## 2018-02-24 ENCOUNTER — Observation Stay (HOSPITAL_COMMUNITY)
Admission: RE | Admit: 2018-02-24 | Discharge: 2018-02-25 | Disposition: A | Discharge status: Home / Self Care | Payer: Medicare HMO | Source: Ambulatory Visit | Attending: Orthopedic Surgery | Admitting: Orthopedic Surgery

## 2018-02-24 ENCOUNTER — Encounter (HOSPITAL_COMMUNITY): Payer: Self-pay

## 2018-02-24 DIAGNOSIS — F329 Major depressive disorder, single episode, unspecified: Secondary | ICD-10-CM | POA: Diagnosis not present

## 2018-02-24 DIAGNOSIS — K219 Gastro-esophageal reflux disease without esophagitis: Secondary | ICD-10-CM | POA: Insufficient documentation

## 2018-02-24 DIAGNOSIS — Y9301 Activity, walking, marching and hiking: Secondary | ICD-10-CM | POA: Diagnosis not present

## 2018-02-24 DIAGNOSIS — S76112D Strain of left quadriceps muscle, fascia and tendon, subsequent encounter: Secondary | ICD-10-CM

## 2018-02-24 DIAGNOSIS — Y929 Unspecified place or not applicable: Secondary | ICD-10-CM | POA: Insufficient documentation

## 2018-02-24 DIAGNOSIS — Z87891 Personal history of nicotine dependence: Secondary | ICD-10-CM | POA: Diagnosis not present

## 2018-02-24 DIAGNOSIS — G4730 Sleep apnea, unspecified: Secondary | ICD-10-CM | POA: Insufficient documentation

## 2018-02-24 DIAGNOSIS — Z7982 Long term (current) use of aspirin: Secondary | ICD-10-CM | POA: Insufficient documentation

## 2018-02-24 DIAGNOSIS — I10 Essential (primary) hypertension: Secondary | ICD-10-CM | POA: Diagnosis not present

## 2018-02-24 DIAGNOSIS — W19XXXA Unspecified fall, initial encounter: Secondary | ICD-10-CM | POA: Diagnosis not present

## 2018-02-24 DIAGNOSIS — F419 Anxiety disorder, unspecified: Secondary | ICD-10-CM | POA: Diagnosis not present

## 2018-02-24 DIAGNOSIS — S76112A Strain of left quadriceps muscle, fascia and tendon, initial encounter: Secondary | ICD-10-CM | POA: Diagnosis not present

## 2018-02-24 DIAGNOSIS — Z79899 Other long term (current) drug therapy: Secondary | ICD-10-CM | POA: Insufficient documentation

## 2018-02-24 DIAGNOSIS — I350 Nonrheumatic aortic (valve) stenosis: Secondary | ICD-10-CM | POA: Diagnosis not present

## 2018-02-24 HISTORY — PX: QUADRICEPS TENDON REPAIR: SHX756

## 2018-02-24 LAB — GLUCOSE, CAPILLARY: Glucose-Capillary: 125 mg/dL — ABNORMAL HIGH (ref 65–99)

## 2018-02-24 SURGERY — REPAIR, TENDON, QUADRICEPS
Anesthesia: General | Site: Knee | Laterality: Left

## 2018-02-24 MED ORDER — ALLOPURINOL 300 MG PO TABS
300.0000 mg | ORAL_TABLET | Freq: Every day | ORAL | Status: DC
  Administered 2018-02-25: 300 mg via ORAL
  Filled 2018-02-24 (×2): qty 1

## 2018-02-24 MED ORDER — ONDANSETRON HCL 4 MG/2ML IJ SOLN: 4.0000 mg | Freq: Four times a day (QID) | INTRAMUSCULAR | Status: DC | PRN

## 2018-02-24 MED ORDER — FUROSEMIDE 20 MG PO TABS
20.0000 mg | ORAL_TABLET | Freq: Every day | ORAL | Status: DC
  Administered 2018-02-24 – 2018-02-25 (×2): 20 mg via ORAL
  Filled 2018-02-24 (×2): qty 1

## 2018-02-24 MED ORDER — FENTANYL CITRATE (PF) 100 MCG/2ML IJ SOLN
25.0000 ug | INTRAMUSCULAR | Status: AC | PRN
  Administered 2018-02-24: 50 ug via INTRAVENOUS

## 2018-02-24 MED ORDER — SUGAMMADEX SODIUM 200 MG/2ML IV SOLN
INTRAVENOUS | Status: AC
  Filled 2018-02-24: qty 2

## 2018-02-24 MED ORDER — FENTANYL CITRATE (PF) 100 MCG/2ML IJ SOLN
INTRAMUSCULAR | Status: AC
  Filled 2018-02-24: qty 2

## 2018-02-24 MED ORDER — TRAMADOL HCL 50 MG PO TABS
50.0000 mg | ORAL_TABLET | Freq: Four times a day (QID) | ORAL | Status: DC
  Administered 2018-02-24 – 2018-02-25 (×5): 50 mg via ORAL
  Filled 2018-02-24 (×5): qty 1

## 2018-02-24 MED ORDER — PROPOFOL 10 MG/ML IV BOLUS
INTRAVENOUS | Status: DC | PRN
  Administered 2018-02-24: 150 mg via INTRAVENOUS

## 2018-02-24 MED ORDER — TRAZODONE HCL 50 MG PO TABS
150.0000 mg | ORAL_TABLET | Freq: Every day | ORAL | Status: DC
  Administered 2018-02-24: 150 mg via ORAL
  Filled 2018-02-24: qty 3

## 2018-02-24 MED ORDER — ONDANSETRON HCL 4 MG/2ML IJ SOLN
INTRAMUSCULAR | Status: AC
  Filled 2018-02-24: qty 2

## 2018-02-24 MED ORDER — EPHEDRINE SULFATE 50 MG/ML IJ SOLN
INTRAMUSCULAR | Status: AC
  Filled 2018-02-24: qty 1

## 2018-02-24 MED ORDER — PHENOL 1.4 % MT LIQD
1.0000 | OROMUCOSAL | Status: DC | PRN
Start: 2018-02-24 — End: 2018-02-25

## 2018-02-24 MED ORDER — MIDAZOLAM HCL 2 MG/2ML IJ SOLN
INTRAMUSCULAR | Status: AC
  Filled 2018-02-24: qty 2

## 2018-02-24 MED ORDER — ONDANSETRON HCL 4 MG PO TABS: 4.0000 mg | ORAL_TABLET | Freq: Four times a day (QID) | ORAL | Status: DC | PRN

## 2018-02-24 MED ORDER — CEFAZOLIN SODIUM-DEXTROSE 2-4 GM/100ML-% IV SOLN
2.0000 g | INTRAVENOUS | Status: AC
  Administered 2018-02-24: 2 g via INTRAVENOUS
  Filled 2018-02-24: qty 100

## 2018-02-24 MED ORDER — MORPHINE SULFATE (PF) 2 MG/ML IV SOLN: 0.5000 mg | INTRAVENOUS | Status: DC | PRN

## 2018-02-24 MED ORDER — ASPIRIN EC 81 MG PO TBEC
81.0000 mg | DELAYED_RELEASE_TABLET | Freq: Every day | ORAL | Status: DC
  Administered 2018-02-24 – 2018-02-25 (×2): 81 mg via ORAL
  Filled 2018-02-24 (×2): qty 1

## 2018-02-24 MED ORDER — HYDROCODONE-ACETAMINOPHEN 7.5-325 MG PO TABS
1.0000 | ORAL_TABLET | Freq: Once | ORAL | Status: AC | PRN
  Administered 2018-02-24: 1 via ORAL
  Filled 2018-02-24: qty 1

## 2018-02-24 MED ORDER — SUCCINYLCHOLINE CHLORIDE 20 MG/ML IJ SOLN
INTRAMUSCULAR | Status: AC
  Filled 2018-02-24: qty 1

## 2018-02-24 MED ORDER — ROCURONIUM BROMIDE 100 MG/10ML IV SOLN
INTRAVENOUS | Status: DC | PRN
  Administered 2018-02-24: 15 mg via INTRAVENOUS

## 2018-02-24 MED ORDER — BUPIVACAINE LIPOSOME 1.3 % IJ SUSP
INTRAMUSCULAR | Status: AC
Start: 2018-02-24 — End: ?
  Filled 2018-02-24: qty 20

## 2018-02-24 MED ORDER — ENOXAPARIN SODIUM 30 MG/0.3ML ~~LOC~~ SOLN
30.0000 mg | SUBCUTANEOUS | Status: DC
Start: 2018-02-25 — End: 2018-02-24

## 2018-02-24 MED ORDER — MIDAZOLAM HCL 5 MG/5ML IJ SOLN
INTRAMUSCULAR | Status: DC | PRN
  Administered 2018-02-24 (×2): 1 mg via INTRAVENOUS

## 2018-02-24 MED ORDER — POVIDONE-IODINE 10 % EX SWAB: 2.0000 "application " | Freq: Once | CUTANEOUS | Status: DC

## 2018-02-24 MED ORDER — SODIUM CHLORIDE 0.9 % IJ SOLN
INTRAMUSCULAR | Status: AC
  Filled 2018-02-24: qty 20

## 2018-02-24 MED ORDER — DOCUSATE SODIUM 100 MG PO CAPS
100.0000 mg | ORAL_CAPSULE | Freq: Two times a day (BID) | ORAL | Status: DC
  Administered 2018-02-24 – 2018-02-25 (×2): 100 mg via ORAL
  Filled 2018-02-24 (×2): qty 1

## 2018-02-24 MED ORDER — AMLODIPINE BESYLATE 5 MG PO TABS
10.0000 mg | ORAL_TABLET | Freq: Every day | ORAL | Status: DC
  Administered 2018-02-25: 10 mg via ORAL
  Filled 2018-02-24 (×2): qty 2

## 2018-02-24 MED ORDER — FENTANYL CITRATE (PF) 100 MCG/2ML IJ SOLN
INTRAMUSCULAR | Status: AC
  Filled 2018-02-24: qty 4

## 2018-02-24 MED ORDER — ACETAMINOPHEN 500 MG PO TABS
500.0000 mg | ORAL_TABLET | Freq: Four times a day (QID) | ORAL | Status: AC
  Administered 2018-02-24 – 2018-02-25 (×4): 500 mg via ORAL
  Filled 2018-02-24 (×4): qty 1

## 2018-02-24 MED ORDER — FENTANYL CITRATE (PF) 100 MCG/2ML IJ SOLN
INTRAMUSCULAR | Status: DC | PRN
  Administered 2018-02-24 (×2): 50 ug via INTRAVENOUS

## 2018-02-24 MED ORDER — MENTHOL 3 MG MT LOZG: 1.0000 | LOZENGE | OROMUCOSAL | Status: DC | PRN

## 2018-02-24 MED ORDER — BUPIVACAINE-EPINEPHRINE (PF) 0.25% -1:200000 IJ SOLN
INTRAMUSCULAR | Status: AC
  Filled 2018-02-24: qty 60

## 2018-02-24 MED ORDER — FENTANYL CITRATE (PF) 100 MCG/2ML IJ SOLN
25.0000 ug | INTRAMUSCULAR | Status: DC | PRN
  Administered 2018-02-24 (×2): 50 ug via INTRAVENOUS
  Filled 2018-02-24: qty 2

## 2018-02-24 MED ORDER — METOCLOPRAMIDE HCL 10 MG PO TABS: 5.0000 mg | ORAL_TABLET | Freq: Three times a day (TID) | ORAL | Status: DC | PRN

## 2018-02-24 MED ORDER — LABETALOL HCL 200 MG PO TABS
300.0000 mg | ORAL_TABLET | Freq: Three times a day (TID) | ORAL | Status: DC
  Administered 2018-02-24 – 2018-02-25 (×3): 300 mg via ORAL
  Filled 2018-02-24 (×3): qty 2

## 2018-02-24 MED ORDER — PANTOPRAZOLE SODIUM 40 MG PO TBEC
40.0000 mg | DELAYED_RELEASE_TABLET | Freq: Every day | ORAL | Status: DC
  Administered 2018-02-25: 40 mg via ORAL
  Filled 2018-02-24: qty 1

## 2018-02-24 MED ORDER — BUPIVACAINE-EPINEPHRINE (PF) 0.5% -1:200000 IJ SOLN
INTRAMUSCULAR | Status: AC
  Filled 2018-02-24: qty 60

## 2018-02-24 MED ORDER — HYDROCODONE-ACETAMINOPHEN 7.5-325 MG PO TABS
1.0000 | ORAL_TABLET | ORAL | Status: DC | PRN
  Administered 2018-02-24 – 2018-02-25 (×3): 1 via ORAL
  Filled 2018-02-24 (×3): qty 1

## 2018-02-24 MED ORDER — HYDROCODONE-ACETAMINOPHEN 5-325 MG PO TABS
1.0000 | ORAL_TABLET | ORAL | Status: DC | PRN
  Administered 2018-02-24: 1 via ORAL
  Filled 2018-02-24: qty 1

## 2018-02-24 MED ORDER — SODIUM CHLORIDE 0.9 % IJ SOLN
INTRAMUSCULAR | Status: AC
  Filled 2018-02-24: qty 10

## 2018-02-24 MED ORDER — CEFAZOLIN SODIUM-DEXTROSE 2-4 GM/100ML-% IV SOLN
2.0000 g | Freq: Four times a day (QID) | INTRAVENOUS | Status: AC
  Administered 2018-02-24 (×2): 2 g via INTRAVENOUS
  Filled 2018-02-24 (×2): qty 100

## 2018-02-24 MED ORDER — LACTATED RINGERS IV SOLN
INTRAVENOUS | Status: DC
  Administered 2018-02-24 – 2018-02-25 (×2): via INTRAVENOUS

## 2018-02-24 MED ORDER — ROCURONIUM BROMIDE 50 MG/5ML IV SOLN
INTRAVENOUS | Status: AC
  Filled 2018-02-24: qty 1

## 2018-02-24 MED ORDER — LACTATED RINGERS IV SOLN
INTRAVENOUS | Status: DC | PRN
  Administered 2018-02-24: 07:00:00 via INTRAVENOUS

## 2018-02-24 MED ORDER — DOCUSATE SODIUM 100 MG PO CAPS: 100.0000 mg | ORAL_CAPSULE | Freq: Two times a day (BID) | ORAL | Status: DC

## 2018-02-24 MED ORDER — SUCCINYLCHOLINE CHLORIDE 20 MG/ML IJ SOLN
INTRAMUSCULAR | Status: DC | PRN
  Administered 2018-02-24: 160 mg via INTRAVENOUS

## 2018-02-24 MED ORDER — BUPIVACAINE LIPOSOME 1.3 % IJ SUSP
INTRAMUSCULAR | Status: DC | PRN
  Administered 2018-02-24: 20 mL

## 2018-02-24 MED ORDER — ENOXAPARIN SODIUM 40 MG/0.4ML ~~LOC~~ SOLN
40.0000 mg | SUBCUTANEOUS | Status: DC
  Administered 2018-02-25: 40 mg via SUBCUTANEOUS
  Filled 2018-02-24: qty 0.4

## 2018-02-24 MED ORDER — CITALOPRAM HYDROBROMIDE 20 MG PO TABS
40.0000 mg | ORAL_TABLET | Freq: Every day | ORAL | Status: DC
  Administered 2018-02-25: 40 mg via ORAL
  Filled 2018-02-24: qty 2

## 2018-02-24 MED ORDER — SUGAMMADEX SODIUM 200 MG/2ML IV SOLN
INTRAVENOUS | Status: DC | PRN
  Administered 2018-02-24: 100 mg via INTRAVENOUS

## 2018-02-24 MED ORDER — METOCLOPRAMIDE HCL 5 MG/ML IJ SOLN: 5.0000 mg | Freq: Three times a day (TID) | INTRAMUSCULAR | Status: DC | PRN

## 2018-02-24 MED ORDER — CHLORHEXIDINE GLUCONATE 4 % EX LIQD: 60.0000 mL | Freq: Once | CUTANEOUS | Status: DC

## 2018-02-24 MED ORDER — SODIUM CHLORIDE 0.9 % IR SOLN
Status: DC | PRN
  Administered 2018-02-24: 1000 mL

## 2018-02-24 MED ORDER — ONDANSETRON HCL 4 MG/2ML IJ SOLN
INTRAMUSCULAR | Status: DC | PRN
  Administered 2018-02-24: 4 mg via INTRAVENOUS

## 2018-02-24 MED ORDER — LIDOCAINE HCL 1 % IJ SOLN
INTRAMUSCULAR | Status: DC | PRN
  Administered 2018-02-24: 30 mg via INTRADERMAL

## 2018-02-24 MED ORDER — PROPOFOL 10 MG/ML IV BOLUS
INTRAVENOUS | Status: AC
  Filled 2018-02-24: qty 40

## 2018-02-24 MED ORDER — ATORVASTATIN CALCIUM 10 MG PO TABS
10.0000 mg | ORAL_TABLET | Freq: Every day | ORAL | Status: DC
  Administered 2018-02-24 – 2018-02-25 (×2): 10 mg via ORAL
  Filled 2018-02-24 (×2): qty 1

## 2018-02-24 MED ORDER — ACETAMINOPHEN 325 MG PO TABS: 325.0000 mg | ORAL_TABLET | Freq: Four times a day (QID) | ORAL | Status: DC | PRN

## 2018-02-24 SURGICAL SUPPLY — 56 items
BANDAGE ELASTIC 4 LF NS (GAUZE/BANDAGES/DRESSINGS) ×3 IMPLANT
BANDAGE ELASTIC 6 LF NS (GAUZE/BANDAGES/DRESSINGS) ×3 IMPLANT
BANDAGE ESMARK 6X9 LF (GAUZE/BANDAGES/DRESSINGS) ×1 IMPLANT
BIT DRILL 2.8X128 (BIT) ×2 IMPLANT
BIT DRILL 2.8X128MM (BIT) ×1
BLADE SURG SZ10 CARB STEEL (BLADE) ×3 IMPLANT
BNDG CMPR 9X6 STRL LF SNTH (GAUZE/BANDAGES/DRESSINGS) ×1
BNDG CMPR MED 5X4 ELC HKLP NS (GAUZE/BANDAGES/DRESSINGS) ×1
BNDG CMPR MED 5X6 ELC HKLP NS (GAUZE/BANDAGES/DRESSINGS) ×1
BNDG COHESIVE 4X5 TAN STRL (GAUZE/BANDAGES/DRESSINGS) ×3 IMPLANT
BNDG ESMARK 6X9 LF (GAUZE/BANDAGES/DRESSINGS) ×3
CHLORAPREP W/TINT 26ML (MISCELLANEOUS) ×3 IMPLANT
CLOTH BEACON ORANGE TIMEOUT ST (SAFETY) ×3 IMPLANT
COVER LIGHT HANDLE STERIS (MISCELLANEOUS) ×6 IMPLANT
CUFF TOURNIQUET SINGLE 44IN (TOURNIQUET CUFF) ×3 IMPLANT
DRAPE INCISE IOBAN 44X35 STRL (DRAPES) ×3 IMPLANT
ELECT REM PT RETURN 9FT ADLT (ELECTROSURGICAL) ×3
ELECTRODE REM PT RTRN 9FT ADLT (ELECTROSURGICAL) ×1 IMPLANT
GAUZE SPONGE 4X4 12PLY STRL (GAUZE/BANDAGES/DRESSINGS) ×3 IMPLANT
GAUZE XEROFORM 5X9 LF (GAUZE/BANDAGES/DRESSINGS) ×3 IMPLANT
GLOVE BIOGEL PI IND STRL 7.0 (GLOVE) ×2 IMPLANT
GLOVE BIOGEL PI INDICATOR 7.0 (GLOVE) ×4
GLOVE SKINSENSE NS SZ8.0 LF (GLOVE) ×2
GLOVE SKINSENSE STRL SZ8.0 LF (GLOVE) ×1 IMPLANT
GLOVE SS N UNI LF 8.5 STRL (GLOVE) ×3 IMPLANT
GOWN STRL REUS W/ TWL LRG LVL3 (GOWN DISPOSABLE) ×1 IMPLANT
GOWN STRL REUS W/TWL LRG LVL3 (GOWN DISPOSABLE) ×6 IMPLANT
GOWN STRL REUS W/TWL XL LVL3 (GOWN DISPOSABLE) ×3 IMPLANT
INST SET MAJOR BONE (KITS) ×3 IMPLANT
KIT TURNOVER KIT A (KITS) ×3 IMPLANT
MANIFOLD NEPTUNE II (INSTRUMENTS) ×3 IMPLANT
NEEDLE HYPO 21X1.5 SAFETY (NEEDLE) ×3 IMPLANT
NEEDLE MAYO 6 CRC TAPER PT (NEEDLE) IMPLANT
NS IRRIG 1000ML POUR BTL (IV SOLUTION) ×3 IMPLANT
PACK BASIC LIMB (CUSTOM PROCEDURE TRAY) ×3 IMPLANT
PAD ABD 5X9 TENDERSORB (GAUZE/BANDAGES/DRESSINGS) ×3 IMPLANT
PAD ARMBOARD 7.5X6 YLW CONV (MISCELLANEOUS) ×3 IMPLANT
PAD CAST 4YDX4 CTTN HI CHSV (CAST SUPPLIES) ×1 IMPLANT
PADDING CAST COTTON 4X4 STRL (CAST SUPPLIES) ×3
PADDING CAST COTTON 6X4 STRL (CAST SUPPLIES) ×3 IMPLANT
PADDING WEBRIL 6 STERILE (GAUZE/BANDAGES/DRESSINGS) ×3 IMPLANT
PASSER SUT SWANSON 36MM LOOP (INSTRUMENTS) ×3 IMPLANT
SET BASIN LINEN APH (SET/KITS/TRAYS/PACK) ×3 IMPLANT
SPONGE LAP 18X18 X RAY DECT (DISPOSABLE) ×6 IMPLANT
STAPLER VISISTAT 35W (STAPLE) ×3 IMPLANT
SUT BRALON NAB BRD #1 30IN (SUTURE) ×3 IMPLANT
SUT ETHIBOND 5 LR DA (SUTURE) ×9 IMPLANT
SUT ETHIBOND NAB OS 4 #2 30IN (SUTURE) IMPLANT
SUT ETHILON 3 0 FSL (SUTURE) IMPLANT
SUT MON AB 0 CT1 (SUTURE) ×3 IMPLANT
SUT MON AB 2-0 CT1 36 (SUTURE) ×3 IMPLANT
SUT PROLENE 3 0 PS 1 (SUTURE) IMPLANT
SUT VIC AB 1 CT1 27 (SUTURE) ×9
SUT VIC AB 1 CT1 27XBRD ANTBC (SUTURE) ×3 IMPLANT
SYR 30ML LL (SYRINGE) ×3 IMPLANT
SYR BULB IRRIGATION 50ML (SYRINGE) ×3 IMPLANT

## 2018-02-24 NOTE — Transfer of Care (Signed)
Immediate Anesthesia Transfer of Care Note  Patient: John Hanson  Procedure(s) Performed: LEFT QUADRICEPS TENDON REPAIR (Left Knee)  Patient Location: PACU  Anesthesia Type:General  Level of Consciousness: awake and patient cooperative  Airway & Oxygen Therapy: Patient Spontanous Breathing and Patient connected to nasal cannula oxygen  Post-op Assessment: Report given to RN, Post -op Vital signs reviewed and stable and Patient moving all extremities  Post vital signs: Reviewed and stable  Last Vitals:  Vitals Value Taken Time  BP    Temp    Pulse 72 02/24/2018  9:13 AM  Resp 9 02/24/2018  9:13 AM  SpO2 93 % 02/24/2018  9:13 AM  Vitals shown include unvalidated device data.  Last Pain:  Vitals:   02/24/18 9628  TempSrc: Oral  PainSc: 0-No pain      Patients Stated Pain Goal: 7 (36/[id number]/94 [id number])  Complications: No apparent anesthesia complications

## 2018-02-24 NOTE — Care Management Obs Status (Signed)
[place] NOTIFICATION   Patient Details  Name: John Hanson MRN: [id number] Date of Birth: 1941-01-24   Medicare Observation Status Notification Given:  Yes    Sherald Barge, RN 02/24/2018, 3:48 PM

## 2018-02-24 NOTE — Anesthesia Postprocedure Evaluation (Signed)
Anesthesia Post Note  Patient: John Hanson  Procedure(s) Performed: LEFT QUADRICEPS TENDON REPAIR (Left Knee)  Patient location during evaluation: PACU Anesthesia Type: General Level of consciousness: awake and alert and patient cooperative Pain management: pain level controlled Vital Signs Assessment: post-procedure vital signs reviewed and stable Respiratory status: spontaneous breathing, nonlabored ventilation, respiratory function stable and patient connected to nasal cannula oxygen Cardiovascular status: blood pressure returned to baseline Postop Assessment: no apparent nausea or vomiting Anesthetic complications: no     Last Vitals:  Vitals:   02/24/18 0633  BP: (!) 155/88  Pulse: 66  Resp: 18  Temp: 36.6 C  SpO2: 98%    Last Pain:  Vitals:   02/24/18 0633  TempSrc: Oral  PainSc: 0-No pain                 Jamayia Croker J

## 2018-02-24 NOTE — Care Management Note (Signed)
Case Management Note  Patient Details  Name: John Hanson MRN: [id number] Date of Birth: July 17, 1941  Subjective/Objective:       S/p repair of ruptured bicep. Pt from home with family support. Pt has used [facility] in the past and would like them again.              Action/Plan: DC home tomorrow with HH PT. Pt aware HH has 48 hrs to make first visit. Juliann Pulse, [facility] rep, aware of referral and will pull pt info from chart.   Expected Discharge Date:      02/25/18            Expected Discharge Plan:  [place]  In-House Referral:  NA  Discharge planning Services  CM Consult  Post Acute Care Choice:  Home Health Choice offered to:  Patient  DME Arranged:    DME Agency:     HH Arranged:  PT [place]:  [place]  Status of Service:  Completed, signed off  If discussed at [place] of Stay Meetings, dates discussed:    Additional Comments:  Sherald Barge, RN 02/24/2018, 3:45 PM

## 2018-02-24 NOTE — Brief Op Note (Signed)
02/24/2018  9:02 AM  PATIENT:  John Hanson  77 y.o. male  PRE-OPERATIVE DIAGNOSIS:  left quadriceps tendon rupture  POST-OPERATIVE DIAGNOSIS:  left quadriceps tendon rupture  Surgical findings complete rupture of the quadriceps tendon with medial and lateral retinacular ruptures  PROCEDURE:  Procedure(s): LEFT QUADRICEPS TENDON REPAIR (Left)   No implants, #5 suture through drill holes used for repair  SURGEON:  Surgeon(s) and Role:    Carole Civil, MD - Primary  The surgery was done in the following manner  In the preop area the patient was properly identified with 2 approved identification methods his chart was reviewed and his surgical site was confirmed and marked.  He was taken to surgery given appropriate antibiotics and intubated.  In the supine position a tourniquet was placed on his left thigh.  After sterile prep and drape timeout was completed surgical procedure confirmed.  The limb was exsanguinated with a 6 inch Esmarch.  A midline incision was made.  The incision was taken down to the extensor mechanism.  The rupture was immediately repair and it was complete.  There were medial and lateral retinacular tears.  The joint was inspected and irrigated.  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  Then two #5 sutures were placed in the quadriceps tendon in a locking fashion.  The 2 middle sutures were passed through the middle drill hole with a suture passer and passing suture.  The lateral and medial sutures were passed through the lateral and medial drill holes in the same manner.  The knee was placed in maximum extension and the sutures were tied.  The medial and lateral retinaculum was closed with running and interrupted #1 Vicryl suture, the knee was flexed up to 40 degrees and there was tension on  the suture line at that point.  Postop flexion can began at 0 to 30 degrees after 2 weeks.  The wound was irrigated and injected with 20 cc of undiluted exparel.  0 Monocryl was used to close the subcu tissue along with 2-0 Monocryl in interrupted and running fashion and staples were used to reapproximate the skin  Assisted by [place] and endotracheal intubation   Estimated blood loss 25 cc   No blood given   No drains   Tourniquet time 53 minutes 300 mmHg   Counts were correct   Specimens none  Exparel undiluted 20 cc injected   DICTATION: .Dragon Dictation  PLAN OF CARE: Admit for overnight observation  PATIENT DISPOSITION:  PACU - hemodynamically stable.   Delay start of Pharmacological VTE agent (>24hrs) due to surgical blood loss or risk of bleeding: no

## 2018-02-24 NOTE — Interval H&P Note (Signed)
History and Physical Interval Note:  02/24/2018 7:24 AM  John Hanson  has presented today for surgery, with the diagnosis of left quadriceps tendon rupture  The various methods of treatment have been discussed with the patient and family. After consideration of risks, benefits and other options for treatment, the patient has consented to  Procedure(s): [place] (Left) as a surgical intervention .  The patient's history has been reviewed, patient examined, no change in status, stable for surgery.  I have reviewed the patient's chart and labs.  Questions were answered to the patient's satisfaction.      Arther Abbott

## 2018-02-24 NOTE — Evaluation (Signed)
Physical Therapy Evaluation Patient Details Name: John Hanson MRN: [id number] DOB: Aug 10, 1941 Today's Date: 02/24/2018   History of Present Illness  John Hanson a 77 y/o male, s/p LEFT QUADRICEPS TENDON REPAIR (Left) 02/24/18 with the diagnosis of left quadriceps tendon rupture     Clinical Impression  Patient demonstrates slightly labored movement for sit to stands and transferring to chair mostly due to having to use mostly RLE to support body weight due to immobilizer on LLE, good return for ambulation in hallway without loss of balance, instructed in post op bed exercises and tolerated sitting up in chair with BLE elevated after therapy - RN aware.  Patient will benefit from continued physical therapy in hospital and recommended venue below to increase strength, balance, endurance for safe ADLs and gait.    Follow Up Recommendations Home health PT    Equipment Recommendations  None recommended by PT    Recommendations for Other Services       Precautions / Restrictions Precautions Precautions: Fall Required Braces or Orthoses: Knee Immobilizer - Left Knee Immobilizer - Left: On at all times Restrictions Weight Bearing Restrictions: Yes LLE Weight Bearing: Weight bearing as tolerated Other Position/Activity Restrictions: no left knee flexion x 2 weeks, then start 0-30 degrees      Mobility  Bed Mobility Overal bed mobility: Modified Independent             General bed mobility comments: increased time  Transfers Overall transfer level: Needs assistance Equipment used: Rolling walker (2 wheeled) Transfers: Sit to/from [facility] Sit to Stand: Supervision Stand pivot transfers: Supervision       General transfer comment: has difficulty with stand to sitting due to wearing immobilizer left knee  Ambulation/Gait Ambulation/Gait assistance: Supervision Ambulation Distance (Feet): 100 Feet Assistive device: Rolling walker (2  wheeled) Gait Pattern/deviations: Decreased step length - left;Decreased stance time - left;Decreased stride length Gait velocity: decreased   General Gait Details: slightly labored cadence without loss of balance, limited swing phase with LLE due to mild weakness and pain with weightbearing  Stairs            Wheelchair Mobility    Modified Rankin (Stroke Patients Only)       Balance Overall balance assessment: Needs assistance Sitting-balance support: Feet supported;No upper extremity supported Sitting balance-Leahy Scale: Good     Standing balance support: Bilateral upper extremity supported;During functional activity Standing balance-Leahy Scale: Fair Standing balance comment: using RW                             Pertinent Vitals/Pain Pain Assessment: 0-10 Pain Score: 6  Pain Location: left knee Pain Descriptors / Indicators: Sore;Discomfort;Aching Pain Intervention(s): Limited activity within patient's tolerance;Monitored during session;Patient requesting pain meds-RN notified    Home Living Family/patient expects to be discharged to:: Private residence Living Arrangements: Alone Available Help at Discharge: Family Type of Home: House Home Access: Level entry     Home Layout: One [place]: Environmental consultant - 2 wheels;Bedside commode;Shower seat;Cane - single point;Crutches;Grab bars - tub/shower      Prior Function Level of Independence: Independent with assistive device(s)         Comments: Community ambulator, drives, prior to left quadriceps rupture, since using RW for household distances     Hand Dominance        Extremity/Trunk Assessment   Upper Extremity Assessment Upper Extremity Assessment: Overall WFL for tasks assessed  Lower Extremity Assessment Lower Extremity Assessment: Overall WFL for tasks assessed;LLE deficits/detail LLE Deficits / Details: grossly -4/5 except left knee not tested due to in immobilizer     Cervical / Trunk Assessment Cervical / Trunk Assessment: Normal  Communication   Communication: No difficulties  Cognition Arousal/Alertness: Awake/alert Behavior During Therapy: WFL for tasks assessed/performed Overall Cognitive Status: Within Functional Limits for tasks assessed                                        General Comments      Exercises     Assessment/Plan    PT Assessment Patient needs continued PT services  PT Problem List Decreased strength;Decreased activity tolerance;Decreased balance;Decreased mobility       PT Treatment Interventions Gait training;Functional mobility training;Therapeutic activities;Therapeutic exercise;Patient/family education;Stair training    PT Goals (Current goals can be found in the Care Plan section)  Acute Rehab PT Goals Patient Stated Goal: return home with family to assist PT Goal Formulation: With patient Time For Goal Achievement: 02/26/18 Potential to Achieve Goals: Good    Frequency 7X/week   Barriers to discharge        Co-evaluation               AM-PAC PT "6 Clicks" Daily Activity  Outcome Measure Difficulty turning over in bed (including adjusting bedclothes, sheets and blankets)?: None Difficulty moving from lying on back to sitting on the side of the bed? : None Difficulty sitting down on and standing up from a chair with arms (e.g., wheelchair, bedside commode, etc,.)?: A Little Help needed moving to and from a bed to chair (including a wheelchair)?: A Little Help needed walking in hospital room?: None Help needed climbing 3-5 steps with a railing? : A Lot 6 Click Score: 20    End of Session   Activity Tolerance: Patient tolerated treatment well;Patient limited by pain Patient left: in chair;with call bell/phone within reach Nurse Communication: Mobility status;Other (comment)(RN aware that patient left up in chair) PT Visit Diagnosis: Unsteadiness on feet (R26.81);Other  abnormalities of gait and mobility (R26.89);Muscle weakness (generalized) (M62.81)    Time: 0938-1829 PT Time Calculation (min) (ACUTE ONLY): 29 min   Charges:   PT Evaluation $PT Eval Moderate Complexity: 1 Mod PT Treatments $Therapeutic Activity: 23-37 mins   PT G Codes:        1:54 PM, 21-Mar-2018 Lonell Grandchild, MPT Physical Therapist with [facility] [phone] office [phone] mobile phone

## 2018-02-24 NOTE — Op Note (Signed)
02/24/2018  9:02 AM  PATIENT:  John Hanson  77 y.o. male  PRE-OPERATIVE DIAGNOSIS:  left quadriceps tendon rupture  POST-OPERATIVE DIAGNOSIS:  left quadriceps tendon rupture  Surgical findings complete rupture of the quadriceps tendon with medial and lateral retinacular ruptures  PROCEDURE:  Procedure(s): LEFT QUADRICEPS TENDON REPAIR (Left) - 07371  No implants, #5 suture through drill holes used for repair  SURGEON:  Surgeon(s) and Role:    Carole Civil, MD - Primary  The surgery was done in the following manner  In the preop area the patient was properly identified with 2 approved identification methods his chart was reviewed and his surgical site was confirmed and marked.  He was taken to surgery given appropriate antibiotics and intubated.  In the supine position a tourniquet was placed on his left thigh.  After sterile prep and drape timeout was completed surgical procedure confirmed.  The limb was exsanguinated with a 6 inch Esmarch.  A midline incision was made.  The incision was taken down to the extensor mechanism.  The rupture was immediately repair and it was complete.  There were medial and lateral retinacular tears.  The joint was inspected and irrigated.  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  Then two #5 sutures were placed in the quadriceps tendon in a locking fashion.  The 2 middle sutures were passed through the middle drill hole with a suture passer and passing suture.  The lateral and medial sutures were passed through the lateral and medial drill holes in the same manner.  The knee was placed in maximum extension and the sutures were tied.  The medial and lateral retinaculum was closed with running and interrupted #1 Vicryl suture, the knee was flexed up to 40 degrees and there was  tension on the suture line at that point.  Postop flexion can began at 0 to 30 degrees after 2 weeks.  The wound was irrigated and injected with 20 cc of undiluted exparel.  0 Monocryl was used to close the subcu tissue along with 2-0 Monocryl in interrupted and running fashion and staples were used to reapproximate the skin  Assisted by [place] and endotracheal intubation   Estimated blood loss 25 cc   No blood given   No drains   Tourniquet time 53 minutes 300 mmHg   Counts were correct   Specimens none  Exparel undiluted 20 cc injected   DICTATION: .Dragon Dictation  PLAN OF CARE: Admit for overnight observation  PATIENT DISPOSITION:  PACU - hemodynamically stable.   Delay start of Pharmacological VTE agent (>24hrs) due to surgical blood loss or risk of bleeding: no

## 2018-02-24 NOTE — Anesthesia Procedure Notes (Signed)
Procedure Name: Intubation Date/Time: 02/24/2018 7:42 AM Performed by: Charmaine Downs, CRNA Pre-anesthesia Checklist: Patient identified, Patient being monitored, Timeout performed, Emergency Drugs available and Suction available Patient Re-evaluated:Patient Re-evaluated prior to induction Oxygen Delivery Method: Circle System Utilized Preoxygenation: Pre-oxygenation with 100% oxygen Induction Type: IV induction Ventilation: Mask ventilation without difficulty Laryngoscope Size: Mac and 3 Grade View: Grade II Tube type: Oral Tube size: 7.0 mm Number of attempts: 1 Airway Equipment and Method: stylet Placement Confirmation: ETT inserted through vocal cords under direct vision,  positive ETCO2 and breath sounds checked- equal and bilateral Secured at: 21 cm Tube secured with: Tape Dental Injury: Teeth and Oropharynx as per pre-operative assessment

## 2018-02-24 NOTE — Plan of Care (Signed)
  Problem: Acute Rehab PT Goals(only PT should resolve) Goal: Patient Will Transfer Sit To/From Stand Outcome: Progressing Flowsheets (Taken 02/24/2018 1355) Patient will transfer sit to/from stand: with modified independence Goal: Pt Will Transfer Bed To Chair/Chair To Bed Outcome: Progressing Flowsheets (Taken 02/24/2018 1355) Pt will Transfer Bed to Chair/Chair to Bed: with modified independence Goal: Pt Will Ambulate Outcome: Progressing Flowsheets (Taken 02/24/2018 1355) Pt will Ambulate: > 125 feet;with modified independence;with rolling walker   1:56 PM, 02/24/18 Lonell Grandchild, MPT Physical Therapist with [facility] [phone] office [phone] mobile phone

## 2018-02-24 NOTE — Anesthesia Preprocedure Evaluation (Signed)
Anesthesia Evaluation  Patient identified by MRN, date of birth, ID band Patient awake    Reviewed: Allergy & Precautions, NPO status , Patient's Chart, lab work & pertinent test results  Airway Mallampati: II  TM Distance: >3 FB Neck ROM: Full    Dental no notable dental hx. (+) Poor Dentition   Pulmonary neg pulmonary ROS, sleep apnea , former smoker,  [place] with CPAP   Pulmonary exam normal breath sounds clear to auscultation       Cardiovascular Exercise Tolerance: Good hypertension, Pt. on medications + Peripheral Vascular Disease and + DOE  negative cardio ROS Normal cardiovascular examII Rhythm:Regular Rate:Normal     Neuro/Psych PSYCHIATRIC DISORDERS Anxiety Depression negative neurological ROS  negative psych ROS   GI/Hepatic negative GI ROS, Neg liver ROS, GERD  Medicated and Controlled,  Endo/Other  negative endocrine ROS  Renal/GU negative Renal ROS  negative genitourinary   Musculoskeletal negative musculoskeletal ROS (+) Arthritis , Osteoarthritis,    Abdominal   Peds negative pediatric ROS (+)  Hematology negative hematology ROS (+)   Anesthesia Other Findings   Reproductive/Obstetrics negative OB ROS                             Anesthesia Physical Anesthesia Plan  ASA: II  Anesthesia Plan: General   Post-op Pain Management:    Induction: Intravenous  PONV Risk Score and Plan:   Airway Management Planned:   Additional Equipment:   Intra-op Plan:   Post-operative Plan: Extubation in OR  Informed Consent: I have reviewed the patients History and Physical, chart, labs and discussed the procedure including the risks, benefits and alternatives for the proposed anesthesia with the patient or authorized representative who has indicated his/her understanding and acceptance.   Dental advisory given  Plan Discussed with: CRNA  Anesthesia Plan Comments:          Anesthesia Quick Evaluation

## 2018-02-25 ENCOUNTER — Encounter (HOSPITAL_COMMUNITY): Payer: Self-pay | Admitting: Orthopedic Surgery

## 2018-02-25 DIAGNOSIS — F419 Anxiety disorder, unspecified: Secondary | ICD-10-CM | POA: Diagnosis not present

## 2018-02-25 DIAGNOSIS — I350 Nonrheumatic aortic (valve) stenosis: Secondary | ICD-10-CM | POA: Diagnosis not present

## 2018-02-25 DIAGNOSIS — G4730 Sleep apnea, unspecified: Secondary | ICD-10-CM | POA: Diagnosis not present

## 2018-02-25 DIAGNOSIS — I10 Essential (primary) hypertension: Secondary | ICD-10-CM | POA: Diagnosis not present

## 2018-02-25 DIAGNOSIS — F329 Major depressive disorder, single episode, unspecified: Secondary | ICD-10-CM | POA: Diagnosis not present

## 2018-02-25 DIAGNOSIS — Z79899 Other long term (current) drug therapy: Secondary | ICD-10-CM | POA: Diagnosis not present

## 2018-02-25 DIAGNOSIS — K219 Gastro-esophageal reflux disease without esophagitis: Secondary | ICD-10-CM | POA: Diagnosis not present

## 2018-02-25 DIAGNOSIS — S76112A Strain of left quadriceps muscle, fascia and tendon, initial encounter: Secondary | ICD-10-CM | POA: Diagnosis not present

## 2018-02-25 DIAGNOSIS — Z7982 Long term (current) use of aspirin: Secondary | ICD-10-CM | POA: Diagnosis not present

## 2018-02-25 MED ORDER — IBUPROFEN 800 MG PO TABS: 800.0000 mg | ORAL_TABLET | Freq: Three times a day (TID) | ORAL | 1 refills | Status: DC | PRN

## 2018-02-25 MED ORDER — HYDROCODONE-ACETAMINOPHEN 5-325 MG PO TABS: 1.0000 | ORAL_TABLET | ORAL | 0 refills | Status: DC | PRN

## 2018-02-25 NOTE — Progress Notes (Signed)
Patient ID: John Hanson, male   DOB: 1940/10/05, 77 y.o.   MRN: [id number] Postop day 1 patient on observation status post quadriceps tendon repair left knee  BP (!) 174/87 (BP Location: Left Arm)   Pulse 82   Temp 98.2 F (36.8 C) (Oral)   Resp 16   Ht 5\' 8"  (1.727 m)   Wt 250 lb (113.4 kg)   SpO2 98%   BMI 38.01 kg/m   Patient complained of throbbing pain left knee  When I came in for rounds this morning the knee immobilizer was open and down at the patient's ankle.  I reminded the nursing staff and the patient that this is not acceptable the knee is to be straight he was already bending about 30 degrees which she is putting some stress on the suture line.  I reapplied the knee immobilizer and reiterated that it has to be on the knee and the knee has to be kept straight  Patient will be discharged today weight-bear as tolerated in the immobilizer with a walker

## 2018-02-25 NOTE — Progress Notes (Signed)
IV removed, WNL. D/C instructions given to pt and family. Verbalized understanding. Pt family at bedside to transport.

## 2018-02-25 NOTE — Anesthesia Postprocedure Evaluation (Signed)
Anesthesia Post Note  Patient: John Hanson  Procedure(s) Performed: LEFT QUADRICEPS TENDON REPAIR (Left Knee)  Patient location during evaluation: Nursing Unit Anesthesia Type: General Level of consciousness: awake and alert Pain management: pain level controlled Vital Signs Assessment: post-procedure vital signs reviewed and stable Respiratory status: spontaneous breathing, nonlabored ventilation and respiratory function stable Cardiovascular status: blood pressure returned to baseline Postop Assessment: no apparent nausea or vomiting Anesthetic complications: no     Last Vitals:  Vitals:   02/25/18 0144 02/25/18 0551  BP: (!) 166/89 (!) 174/87  Pulse: 78 82  Resp: 20 16  Temp: 36.9 C 36.8 C  SpO2: 98% 98%    Last Pain:  Vitals:   02/25/18 0919  TempSrc:   PainSc: 8                  Kyisha Fowle J

## 2018-02-25 NOTE — Discharge Instructions (Signed)
Keep your knee immobilizer on at all times you do not have to change the dressing keep your knee straight  When you walk you can put all the weight on your leg as long as your knee immobilizer is on

## 2018-02-25 NOTE — Discharge Summary (Signed)
Discharge summary  02/25/2018 7:47 AM  Admitting diagnosis ruptured left quadriceps tendon  Discharge diagnosis same  Procedure open repair left quadriceps tendon  Admitting physician and discharging physician Dr. Aline Brochure Office phone #[phone]  IBBCWUGQ course: The patient was admitted to the hospital for repair of his left quadriceps tendon.  He underwent uncomplicated surgery under general anesthesia.  He was kept in the hospital for fall precautions.  Hospital day 1 May 31 physical therapy saw him cleared him and he was discharged home weightbearing as tolerated in a knee immobilizer  Allergies as of 02/25/2018      Reactions   Shrimp [shellfish Allergy] Other (See Comments)   Flares up gout      Medication List    TAKE these medications   allopurinol 300 MG tablet Commonly known as:  ZYLOPRIM Take 300 mg by mouth daily.   amLODipine 10 MG tablet Commonly known as:  NORVASC Take 10 mg by mouth daily.   aspirin EC 81 MG tablet Take 81 mg by mouth daily.   atorvastatin 10 MG tablet Commonly known as:  LIPITOR Take 10 mg by mouth daily.   citalopram 40 MG tablet Commonly known as:  CELEXA Take 40 mg by mouth daily.   docusate sodium 100 MG capsule Commonly known as:  COLACE Take 1 capsule (100 mg total) by mouth 2 (two) times daily.   furosemide 20 MG tablet Commonly known as:  LASIX Take 1 tablet (20 mg total) by mouth daily.   HYDROcodone-acetaminophen 5-325 MG tablet Commonly known as:  NORCO/VICODIN Take 1 tablet by mouth every 4 (four) hours as needed for moderate pain. What changed:  when to take this   ibuprofen 800 MG tablet Commonly known as:  ADVIL,MOTRIN Take 1 tablet (800 mg total) by mouth every 8 (eight) hours as needed. What changed:    medication strength  how much to take  when to take this   irbesartan 300 MG tablet Commonly known as:  AVAPRO Take 300 mg by mouth daily.   labetalol 300 MG tablet Commonly known as:   NORMODYNE Take 300 mg by mouth 3 (three) times daily.   omeprazole 40 MG capsule Commonly known as:  PRILOSEC Take 40 mg by mouth daily.   traZODone 150 MG tablet Commonly known as:  DESYREL       Follow-up for June 14 to remove his staples  Weightbearing status as tolerated  Dressing do not change

## 2018-02-25 NOTE — Addendum Note (Signed)
Addendum  created 02/25/18 1115 by Charmaine Downs, CRNA   Sign clinical note

## 2018-02-26 DIAGNOSIS — I350 Nonrheumatic aortic (valve) stenosis: Secondary | ICD-10-CM | POA: Diagnosis not present

## 2018-02-26 DIAGNOSIS — W19XXXD Unspecified fall, subsequent encounter: Secondary | ICD-10-CM | POA: Diagnosis not present

## 2018-02-26 DIAGNOSIS — I10 Essential (primary) hypertension: Secondary | ICD-10-CM | POA: Diagnosis not present

## 2018-02-26 DIAGNOSIS — Z4789 Encounter for other orthopedic aftercare: Secondary | ICD-10-CM | POA: Diagnosis not present

## 2018-02-26 DIAGNOSIS — K219 Gastro-esophageal reflux disease without esophagitis: Secondary | ICD-10-CM | POA: Diagnosis not present

## 2018-02-26 DIAGNOSIS — S76112D Strain of left quadriceps muscle, fascia and tendon, subsequent encounter: Secondary | ICD-10-CM | POA: Diagnosis not present

## 2018-02-26 DIAGNOSIS — M1991 Primary osteoarthritis, unspecified site: Secondary | ICD-10-CM | POA: Diagnosis not present

## 2018-02-26 DIAGNOSIS — G4730 Sleep apnea, unspecified: Secondary | ICD-10-CM | POA: Diagnosis not present

## 2018-02-26 DIAGNOSIS — F329 Major depressive disorder, single episode, unspecified: Secondary | ICD-10-CM | POA: Diagnosis not present

## 2018-02-28 ENCOUNTER — Telehealth: Payer: Self-pay | Admitting: Orthopedic Surgery

## 2018-02-28 DIAGNOSIS — F329 Major depressive disorder, single episode, unspecified: Secondary | ICD-10-CM | POA: Diagnosis not present

## 2018-02-28 DIAGNOSIS — K219 Gastro-esophageal reflux disease without esophagitis: Secondary | ICD-10-CM | POA: Diagnosis not present

## 2018-02-28 DIAGNOSIS — S76112D Strain of left quadriceps muscle, fascia and tendon, subsequent encounter: Secondary | ICD-10-CM | POA: Diagnosis not present

## 2018-02-28 DIAGNOSIS — G4730 Sleep apnea, unspecified: Secondary | ICD-10-CM | POA: Diagnosis not present

## 2018-02-28 DIAGNOSIS — W19XXXD Unspecified fall, subsequent encounter: Secondary | ICD-10-CM | POA: Diagnosis not present

## 2018-02-28 DIAGNOSIS — I10 Essential (primary) hypertension: Secondary | ICD-10-CM | POA: Diagnosis not present

## 2018-02-28 DIAGNOSIS — Z4789 Encounter for other orthopedic aftercare: Secondary | ICD-10-CM | POA: Diagnosis not present

## 2018-02-28 DIAGNOSIS — M1991 Primary osteoarthritis, unspecified site: Secondary | ICD-10-CM | POA: Diagnosis not present

## 2018-02-28 DIAGNOSIS — I350 Nonrheumatic aortic (valve) stenosis: Secondary | ICD-10-CM | POA: Diagnosis not present

## 2018-02-28 NOTE — Telephone Encounter (Signed)
Debbie w/[facility] called wanting a verbal for this patient. The frequency she is wanting is: 1 time for 1 week, 3 times for 1 week and 2 times for 3 weeks.  Please call and advise:  [phone].

## 2018-03-02 DIAGNOSIS — F329 Major depressive disorder, single episode, unspecified: Secondary | ICD-10-CM | POA: Diagnosis not present

## 2018-03-02 DIAGNOSIS — M1991 Primary osteoarthritis, unspecified site: Secondary | ICD-10-CM | POA: Diagnosis not present

## 2018-03-02 DIAGNOSIS — I350 Nonrheumatic aortic (valve) stenosis: Secondary | ICD-10-CM | POA: Diagnosis not present

## 2018-03-02 DIAGNOSIS — Z4789 Encounter for other orthopedic aftercare: Secondary | ICD-10-CM | POA: Diagnosis not present

## 2018-03-02 DIAGNOSIS — S76112D Strain of left quadriceps muscle, fascia and tendon, subsequent encounter: Secondary | ICD-10-CM | POA: Diagnosis not present

## 2018-03-02 DIAGNOSIS — I10 Essential (primary) hypertension: Secondary | ICD-10-CM | POA: Diagnosis not present

## 2018-03-02 DIAGNOSIS — K219 Gastro-esophageal reflux disease without esophagitis: Secondary | ICD-10-CM | POA: Diagnosis not present

## 2018-03-02 DIAGNOSIS — G4730 Sleep apnea, unspecified: Secondary | ICD-10-CM | POA: Diagnosis not present

## 2018-03-02 DIAGNOSIS — W19XXXD Unspecified fall, subsequent encounter: Secondary | ICD-10-CM | POA: Diagnosis not present

## 2018-03-03 ENCOUNTER — Encounter: Payer: Self-pay | Admitting: *Deleted

## 2018-03-03 NOTE — Progress Notes (Signed)
No precert was required for surgery 02/24/18 per Adele Barthel with [facility].

## 2018-03-04 DIAGNOSIS — I350 Nonrheumatic aortic (valve) stenosis: Secondary | ICD-10-CM | POA: Diagnosis not present

## 2018-03-04 DIAGNOSIS — K219 Gastro-esophageal reflux disease without esophagitis: Secondary | ICD-10-CM | POA: Diagnosis not present

## 2018-03-04 DIAGNOSIS — I10 Essential (primary) hypertension: Secondary | ICD-10-CM | POA: Diagnosis not present

## 2018-03-04 DIAGNOSIS — Z4789 Encounter for other orthopedic aftercare: Secondary | ICD-10-CM | POA: Diagnosis not present

## 2018-03-04 DIAGNOSIS — M1991 Primary osteoarthritis, unspecified site: Secondary | ICD-10-CM | POA: Diagnosis not present

## 2018-03-04 DIAGNOSIS — F329 Major depressive disorder, single episode, unspecified: Secondary | ICD-10-CM | POA: Diagnosis not present

## 2018-03-04 DIAGNOSIS — G4730 Sleep apnea, unspecified: Secondary | ICD-10-CM | POA: Diagnosis not present

## 2018-03-04 DIAGNOSIS — W19XXXD Unspecified fall, subsequent encounter: Secondary | ICD-10-CM | POA: Diagnosis not present

## 2018-03-04 DIAGNOSIS — S76112D Strain of left quadriceps muscle, fascia and tendon, subsequent encounter: Secondary | ICD-10-CM | POA: Diagnosis not present

## 2018-03-07 NOTE — Telephone Encounter (Signed)
John Hanson, [facility] called back to verify home visits for:, 1x per week for 1 week, 3x per week for 1 week, then 2x per week for 3 weeks. Please call at [phone]

## 2018-03-07 NOTE — Telephone Encounter (Signed)
Called gave verbal apologized for the delay

## 2018-03-08 DIAGNOSIS — F329 Major depressive disorder, single episode, unspecified: Secondary | ICD-10-CM | POA: Diagnosis not present

## 2018-03-08 DIAGNOSIS — W19XXXD Unspecified fall, subsequent encounter: Secondary | ICD-10-CM | POA: Diagnosis not present

## 2018-03-08 DIAGNOSIS — I350 Nonrheumatic aortic (valve) stenosis: Secondary | ICD-10-CM | POA: Diagnosis not present

## 2018-03-08 DIAGNOSIS — I10 Essential (primary) hypertension: Secondary | ICD-10-CM | POA: Diagnosis not present

## 2018-03-08 DIAGNOSIS — M1991 Primary osteoarthritis, unspecified site: Secondary | ICD-10-CM | POA: Diagnosis not present

## 2018-03-08 DIAGNOSIS — K219 Gastro-esophageal reflux disease without esophagitis: Secondary | ICD-10-CM | POA: Diagnosis not present

## 2018-03-08 DIAGNOSIS — G4730 Sleep apnea, unspecified: Secondary | ICD-10-CM | POA: Diagnosis not present

## 2018-03-08 DIAGNOSIS — Z4789 Encounter for other orthopedic aftercare: Secondary | ICD-10-CM | POA: Diagnosis not present

## 2018-03-08 DIAGNOSIS — S76112D Strain of left quadriceps muscle, fascia and tendon, subsequent encounter: Secondary | ICD-10-CM | POA: Diagnosis not present

## 2018-03-11 ENCOUNTER — Ambulatory Visit (INDEPENDENT_AMBULATORY_CARE_PROVIDER_SITE_OTHER): Payer: Medicare HMO | Admitting: Orthopedic Surgery

## 2018-03-11 ENCOUNTER — Encounter: Payer: Self-pay | Admitting: Orthopedic Surgery

## 2018-03-11 VITALS — BP 151/82 | HR 72 | Ht 68.5 in

## 2018-03-11 DIAGNOSIS — Z4789 Encounter for other orthopedic aftercare: Secondary | ICD-10-CM | POA: Diagnosis not present

## 2018-03-11 DIAGNOSIS — S76112D Strain of left quadriceps muscle, fascia and tendon, subsequent encounter: Secondary | ICD-10-CM

## 2018-03-11 DIAGNOSIS — K219 Gastro-esophageal reflux disease without esophagitis: Secondary | ICD-10-CM | POA: Diagnosis not present

## 2018-03-11 DIAGNOSIS — I350 Nonrheumatic aortic (valve) stenosis: Secondary | ICD-10-CM | POA: Diagnosis not present

## 2018-03-11 DIAGNOSIS — W19XXXD Unspecified fall, subsequent encounter: Secondary | ICD-10-CM | POA: Diagnosis not present

## 2018-03-11 DIAGNOSIS — G4730 Sleep apnea, unspecified: Secondary | ICD-10-CM | POA: Diagnosis not present

## 2018-03-11 DIAGNOSIS — F329 Major depressive disorder, single episode, unspecified: Secondary | ICD-10-CM | POA: Diagnosis not present

## 2018-03-11 DIAGNOSIS — I10 Essential (primary) hypertension: Secondary | ICD-10-CM | POA: Diagnosis not present

## 2018-03-11 DIAGNOSIS — M1991 Primary osteoarthritis, unspecified site: Secondary | ICD-10-CM | POA: Diagnosis not present

## 2018-03-11 MED ORDER — HYDROCODONE-ACETAMINOPHEN 5-325 MG PO TABS: 1.0000 | ORAL_TABLET | Freq: Four times a day (QID) | ORAL | 0 refills | Status: DC | PRN

## 2018-03-11 NOTE — Progress Notes (Signed)
Postop appointment No chief complaint on file.  Encounter Diagnosis  Name Primary?  . Quadriceps tendon rupture, left, subsequent encounter s/p repair 02/24/18 Yes    Patient complaints pain but this is getting better  Medicine for pain at this time: Hydrocodone for 1 more week  Wound Staples look good they were removed wound looks good  Postop plan  Weight-bear as tolerated  Continue knee immobilizer for 6 weeks.  Initial plan was to start motion 0-30 at 2 weeks but I changed my mind, and decided to be more conservative we will start the motion at 6 weeks  Meds ordered this encounter  Medications  . HYDROcodone-acetaminophen (NORCO/VICODIN) 5-325 MG tablet    Sig: Take 1 tablet by mouth every 6 (six) hours as needed for moderate pain.    Dispense:  28 tablet    Refill:  0

## 2018-03-11 NOTE — Patient Instructions (Signed)
Weight bearing as tolerated in the brace x 4 weeks   Return in 4 weeks

## 2018-03-14 DIAGNOSIS — M1991 Primary osteoarthritis, unspecified site: Secondary | ICD-10-CM | POA: Diagnosis not present

## 2018-03-14 DIAGNOSIS — S76112D Strain of left quadriceps muscle, fascia and tendon, subsequent encounter: Secondary | ICD-10-CM | POA: Diagnosis not present

## 2018-03-14 DIAGNOSIS — Z4789 Encounter for other orthopedic aftercare: Secondary | ICD-10-CM | POA: Diagnosis not present

## 2018-03-14 DIAGNOSIS — W19XXXD Unspecified fall, subsequent encounter: Secondary | ICD-10-CM | POA: Diagnosis not present

## 2018-03-14 DIAGNOSIS — K219 Gastro-esophageal reflux disease without esophagitis: Secondary | ICD-10-CM | POA: Diagnosis not present

## 2018-03-14 DIAGNOSIS — F329 Major depressive disorder, single episode, unspecified: Secondary | ICD-10-CM | POA: Diagnosis not present

## 2018-03-14 DIAGNOSIS — I10 Essential (primary) hypertension: Secondary | ICD-10-CM | POA: Diagnosis not present

## 2018-03-14 DIAGNOSIS — G4730 Sleep apnea, unspecified: Secondary | ICD-10-CM | POA: Diagnosis not present

## 2018-03-14 DIAGNOSIS — I350 Nonrheumatic aortic (valve) stenosis: Secondary | ICD-10-CM | POA: Diagnosis not present

## 2018-03-17 DIAGNOSIS — I10 Essential (primary) hypertension: Secondary | ICD-10-CM | POA: Diagnosis not present

## 2018-03-17 DIAGNOSIS — S76112D Strain of left quadriceps muscle, fascia and tendon, subsequent encounter: Secondary | ICD-10-CM | POA: Diagnosis not present

## 2018-03-17 DIAGNOSIS — G4730 Sleep apnea, unspecified: Secondary | ICD-10-CM | POA: Diagnosis not present

## 2018-03-17 DIAGNOSIS — I350 Nonrheumatic aortic (valve) stenosis: Secondary | ICD-10-CM | POA: Diagnosis not present

## 2018-03-17 DIAGNOSIS — F329 Major depressive disorder, single episode, unspecified: Secondary | ICD-10-CM | POA: Diagnosis not present

## 2018-03-17 DIAGNOSIS — Z4789 Encounter for other orthopedic aftercare: Secondary | ICD-10-CM | POA: Diagnosis not present

## 2018-03-17 DIAGNOSIS — M1991 Primary osteoarthritis, unspecified site: Secondary | ICD-10-CM | POA: Diagnosis not present

## 2018-03-17 DIAGNOSIS — W19XXXD Unspecified fall, subsequent encounter: Secondary | ICD-10-CM | POA: Diagnosis not present

## 2018-03-17 DIAGNOSIS — K219 Gastro-esophageal reflux disease without esophagitis: Secondary | ICD-10-CM | POA: Diagnosis not present

## 2018-03-21 ENCOUNTER — Telehealth: Payer: Self-pay | Admitting: Orthopedic Surgery

## 2018-03-21 DIAGNOSIS — I10 Essential (primary) hypertension: Secondary | ICD-10-CM | POA: Diagnosis not present

## 2018-03-21 DIAGNOSIS — K219 Gastro-esophageal reflux disease without esophagitis: Secondary | ICD-10-CM | POA: Diagnosis not present

## 2018-03-21 DIAGNOSIS — S76112D Strain of left quadriceps muscle, fascia and tendon, subsequent encounter: Secondary | ICD-10-CM | POA: Diagnosis not present

## 2018-03-21 DIAGNOSIS — W19XXXD Unspecified fall, subsequent encounter: Secondary | ICD-10-CM | POA: Diagnosis not present

## 2018-03-21 DIAGNOSIS — G4730 Sleep apnea, unspecified: Secondary | ICD-10-CM | POA: Diagnosis not present

## 2018-03-21 DIAGNOSIS — M1991 Primary osteoarthritis, unspecified site: Secondary | ICD-10-CM | POA: Diagnosis not present

## 2018-03-21 DIAGNOSIS — F329 Major depressive disorder, single episode, unspecified: Secondary | ICD-10-CM | POA: Diagnosis not present

## 2018-03-21 DIAGNOSIS — I350 Nonrheumatic aortic (valve) stenosis: Secondary | ICD-10-CM | POA: Diagnosis not present

## 2018-03-21 DIAGNOSIS — Z4789 Encounter for other orthopedic aftercare: Secondary | ICD-10-CM | POA: Diagnosis not present

## 2018-03-21 NOTE — Telephone Encounter (Signed)
Debbie from [employer] called asking to extend patient's care. She is asking to see him twice a week for a period of 3 weeks.  She can be reached at [phone]  Please call and advise

## 2018-03-21 NOTE — Telephone Encounter (Signed)
Called with Verbal order 

## 2018-03-23 DIAGNOSIS — W19XXXD Unspecified fall, subsequent encounter: Secondary | ICD-10-CM | POA: Diagnosis not present

## 2018-03-23 DIAGNOSIS — K219 Gastro-esophageal reflux disease without esophagitis: Secondary | ICD-10-CM | POA: Diagnosis not present

## 2018-03-23 DIAGNOSIS — Z4789 Encounter for other orthopedic aftercare: Secondary | ICD-10-CM | POA: Diagnosis not present

## 2018-03-23 DIAGNOSIS — M1991 Primary osteoarthritis, unspecified site: Secondary | ICD-10-CM | POA: Diagnosis not present

## 2018-03-23 DIAGNOSIS — F329 Major depressive disorder, single episode, unspecified: Secondary | ICD-10-CM | POA: Diagnosis not present

## 2018-03-23 DIAGNOSIS — S76112D Strain of left quadriceps muscle, fascia and tendon, subsequent encounter: Secondary | ICD-10-CM | POA: Diagnosis not present

## 2018-03-23 DIAGNOSIS — I350 Nonrheumatic aortic (valve) stenosis: Secondary | ICD-10-CM | POA: Diagnosis not present

## 2018-03-23 DIAGNOSIS — G4730 Sleep apnea, unspecified: Secondary | ICD-10-CM | POA: Diagnosis not present

## 2018-03-23 DIAGNOSIS — I10 Essential (primary) hypertension: Secondary | ICD-10-CM | POA: Diagnosis not present

## 2018-03-28 DIAGNOSIS — I350 Nonrheumatic aortic (valve) stenosis: Secondary | ICD-10-CM | POA: Diagnosis not present

## 2018-03-28 DIAGNOSIS — W19XXXD Unspecified fall, subsequent encounter: Secondary | ICD-10-CM | POA: Diagnosis not present

## 2018-03-28 DIAGNOSIS — G4730 Sleep apnea, unspecified: Secondary | ICD-10-CM | POA: Diagnosis not present

## 2018-03-28 DIAGNOSIS — K219 Gastro-esophageal reflux disease without esophagitis: Secondary | ICD-10-CM | POA: Diagnosis not present

## 2018-03-28 DIAGNOSIS — S76112D Strain of left quadriceps muscle, fascia and tendon, subsequent encounter: Secondary | ICD-10-CM | POA: Diagnosis not present

## 2018-03-28 DIAGNOSIS — F329 Major depressive disorder, single episode, unspecified: Secondary | ICD-10-CM | POA: Diagnosis not present

## 2018-03-28 DIAGNOSIS — M1991 Primary osteoarthritis, unspecified site: Secondary | ICD-10-CM | POA: Diagnosis not present

## 2018-03-28 DIAGNOSIS — Z4789 Encounter for other orthopedic aftercare: Secondary | ICD-10-CM | POA: Diagnosis not present

## 2018-03-28 DIAGNOSIS — I10 Essential (primary) hypertension: Secondary | ICD-10-CM | POA: Diagnosis not present

## 2018-03-30 DIAGNOSIS — K219 Gastro-esophageal reflux disease without esophagitis: Secondary | ICD-10-CM | POA: Diagnosis not present

## 2018-03-30 DIAGNOSIS — I10 Essential (primary) hypertension: Secondary | ICD-10-CM | POA: Diagnosis not present

## 2018-03-30 DIAGNOSIS — S76112D Strain of left quadriceps muscle, fascia and tendon, subsequent encounter: Secondary | ICD-10-CM | POA: Diagnosis not present

## 2018-03-30 DIAGNOSIS — I350 Nonrheumatic aortic (valve) stenosis: Secondary | ICD-10-CM | POA: Diagnosis not present

## 2018-03-30 DIAGNOSIS — M1991 Primary osteoarthritis, unspecified site: Secondary | ICD-10-CM | POA: Diagnosis not present

## 2018-03-30 DIAGNOSIS — F329 Major depressive disorder, single episode, unspecified: Secondary | ICD-10-CM | POA: Diagnosis not present

## 2018-03-30 DIAGNOSIS — G4730 Sleep apnea, unspecified: Secondary | ICD-10-CM | POA: Diagnosis not present

## 2018-03-30 DIAGNOSIS — Z4789 Encounter for other orthopedic aftercare: Secondary | ICD-10-CM | POA: Diagnosis not present

## 2018-03-30 DIAGNOSIS — W19XXXD Unspecified fall, subsequent encounter: Secondary | ICD-10-CM | POA: Diagnosis not present

## 2018-04-04 DIAGNOSIS — M1991 Primary osteoarthritis, unspecified site: Secondary | ICD-10-CM | POA: Diagnosis not present

## 2018-04-04 DIAGNOSIS — I350 Nonrheumatic aortic (valve) stenosis: Secondary | ICD-10-CM | POA: Diagnosis not present

## 2018-04-04 DIAGNOSIS — S76112D Strain of left quadriceps muscle, fascia and tendon, subsequent encounter: Secondary | ICD-10-CM | POA: Diagnosis not present

## 2018-04-04 DIAGNOSIS — K219 Gastro-esophageal reflux disease without esophagitis: Secondary | ICD-10-CM | POA: Diagnosis not present

## 2018-04-04 DIAGNOSIS — Z4789 Encounter for other orthopedic aftercare: Secondary | ICD-10-CM | POA: Diagnosis not present

## 2018-04-04 DIAGNOSIS — I10 Essential (primary) hypertension: Secondary | ICD-10-CM | POA: Diagnosis not present

## 2018-04-04 DIAGNOSIS — W19XXXD Unspecified fall, subsequent encounter: Secondary | ICD-10-CM | POA: Diagnosis not present

## 2018-04-04 DIAGNOSIS — G4730 Sleep apnea, unspecified: Secondary | ICD-10-CM | POA: Diagnosis not present

## 2018-04-04 DIAGNOSIS — F329 Major depressive disorder, single episode, unspecified: Secondary | ICD-10-CM | POA: Diagnosis not present

## 2018-04-06 DIAGNOSIS — F329 Major depressive disorder, single episode, unspecified: Secondary | ICD-10-CM | POA: Diagnosis not present

## 2018-04-06 DIAGNOSIS — G4730 Sleep apnea, unspecified: Secondary | ICD-10-CM | POA: Diagnosis not present

## 2018-04-06 DIAGNOSIS — K219 Gastro-esophageal reflux disease without esophagitis: Secondary | ICD-10-CM | POA: Diagnosis not present

## 2018-04-06 DIAGNOSIS — W19XXXD Unspecified fall, subsequent encounter: Secondary | ICD-10-CM | POA: Diagnosis not present

## 2018-04-06 DIAGNOSIS — S76112D Strain of left quadriceps muscle, fascia and tendon, subsequent encounter: Secondary | ICD-10-CM | POA: Diagnosis not present

## 2018-04-06 DIAGNOSIS — I350 Nonrheumatic aortic (valve) stenosis: Secondary | ICD-10-CM | POA: Diagnosis not present

## 2018-04-06 DIAGNOSIS — Z4789 Encounter for other orthopedic aftercare: Secondary | ICD-10-CM | POA: Diagnosis not present

## 2018-04-06 DIAGNOSIS — M1991 Primary osteoarthritis, unspecified site: Secondary | ICD-10-CM | POA: Diagnosis not present

## 2018-04-06 DIAGNOSIS — I10 Essential (primary) hypertension: Secondary | ICD-10-CM | POA: Diagnosis not present

## 2018-04-08 ENCOUNTER — Ambulatory Visit (INDEPENDENT_AMBULATORY_CARE_PROVIDER_SITE_OTHER): Payer: Medicare HMO | Admitting: Orthopedic Surgery

## 2018-04-08 VITALS — BP 160/90 | HR 67 | Ht 68.5 in | Wt 255.0 lb

## 2018-04-08 DIAGNOSIS — S76112D Strain of left quadriceps muscle, fascia and tendon, subsequent encounter: Secondary | ICD-10-CM | POA: Diagnosis not present

## 2018-04-08 MED ORDER — IBUPROFEN 800 MG PO TABS: 800.0000 mg | ORAL_TABLET | Freq: Three times a day (TID) | ORAL | 0 refills | Status: DC | PRN

## 2018-04-08 MED ORDER — HYDROCODONE-ACETAMINOPHEN 5-325 MG PO TABS: 1.0000 | ORAL_TABLET | Freq: Three times a day (TID) | ORAL | 0 refills | Status: DC | PRN

## 2018-04-08 NOTE — Addendum Note (Signed)
Addended by: Arther Abbott E on: 04/08/2018 10:06 AM   Modules accepted: Orders

## 2018-04-08 NOTE — Progress Notes (Addendum)
POSTOP VISIT  POD # 41  Chief Complaint  Patient presents with  . Follow-up    Recheck on left quad, DOS 02-24-18.    77, 6 weeks post quadriceps repair left knee currently in knee immobilizer weight-bear as tolerated     Encounter Diagnosis  Name Primary?  . Quadriceps tendon rupture, left, subsequent encounter s/p repair 02/24/18 Yes    Patient has excellent straight leg raise without extensor lag he has 30 degrees of flexion  Postoperative plan (Work, WB, No orders of the defined types were placed in this encounter. ,FU)  Start hinged knee brace 30 degrees of flexion advance 30 degrees every 2 weeks  Continue weightbearing as tolerated and physical therapy to help knee motion  Stop opioid pain medication continue ibuprofen as needed for pain  Follow-up 2 weeks  Meds ordered this encounter  Medications  . ibuprofen (ADVIL,MOTRIN) 800 MG tablet    Sig: Take 1 tablet (800 mg total) by mouth every 8 (eight) hours as needed.    Dispense:  270 tablet    Refill:  0  . HYDROcodone-acetaminophen (NORCO/VICODIN) 5-325 MG tablet    Sig: Take 1 tablet by mouth every 8 (eight) hours as needed for moderate pain.    Dispense:  21 tablet    Refill:  0

## 2018-04-11 DIAGNOSIS — G4730 Sleep apnea, unspecified: Secondary | ICD-10-CM | POA: Diagnosis not present

## 2018-04-11 DIAGNOSIS — F329 Major depressive disorder, single episode, unspecified: Secondary | ICD-10-CM | POA: Diagnosis not present

## 2018-04-11 DIAGNOSIS — K219 Gastro-esophageal reflux disease without esophagitis: Secondary | ICD-10-CM | POA: Diagnosis not present

## 2018-04-11 DIAGNOSIS — S76112D Strain of left quadriceps muscle, fascia and tendon, subsequent encounter: Secondary | ICD-10-CM | POA: Diagnosis not present

## 2018-04-11 DIAGNOSIS — W19XXXD Unspecified fall, subsequent encounter: Secondary | ICD-10-CM | POA: Diagnosis not present

## 2018-04-11 DIAGNOSIS — Z4789 Encounter for other orthopedic aftercare: Secondary | ICD-10-CM | POA: Diagnosis not present

## 2018-04-11 DIAGNOSIS — I350 Nonrheumatic aortic (valve) stenosis: Secondary | ICD-10-CM | POA: Diagnosis not present

## 2018-04-11 DIAGNOSIS — M1991 Primary osteoarthritis, unspecified site: Secondary | ICD-10-CM | POA: Diagnosis not present

## 2018-04-11 DIAGNOSIS — I10 Essential (primary) hypertension: Secondary | ICD-10-CM | POA: Diagnosis not present

## 2018-04-13 DIAGNOSIS — I10 Essential (primary) hypertension: Secondary | ICD-10-CM | POA: Diagnosis not present

## 2018-04-13 DIAGNOSIS — M1991 Primary osteoarthritis, unspecified site: Secondary | ICD-10-CM | POA: Diagnosis not present

## 2018-04-13 DIAGNOSIS — K219 Gastro-esophageal reflux disease without esophagitis: Secondary | ICD-10-CM | POA: Diagnosis not present

## 2018-04-13 DIAGNOSIS — W19XXXD Unspecified fall, subsequent encounter: Secondary | ICD-10-CM | POA: Diagnosis not present

## 2018-04-13 DIAGNOSIS — Z4789 Encounter for other orthopedic aftercare: Secondary | ICD-10-CM | POA: Diagnosis not present

## 2018-04-13 DIAGNOSIS — G4730 Sleep apnea, unspecified: Secondary | ICD-10-CM | POA: Diagnosis not present

## 2018-04-13 DIAGNOSIS — F329 Major depressive disorder, single episode, unspecified: Secondary | ICD-10-CM | POA: Diagnosis not present

## 2018-04-13 DIAGNOSIS — S76112D Strain of left quadriceps muscle, fascia and tendon, subsequent encounter: Secondary | ICD-10-CM | POA: Diagnosis not present

## 2018-04-13 DIAGNOSIS — I350 Nonrheumatic aortic (valve) stenosis: Secondary | ICD-10-CM | POA: Diagnosis not present

## 2018-04-19 DIAGNOSIS — M150 Primary generalized (osteo)arthritis: Secondary | ICD-10-CM | POA: Diagnosis not present

## 2018-04-19 DIAGNOSIS — G5601 Carpal tunnel syndrome, right upper limb: Secondary | ICD-10-CM | POA: Diagnosis not present

## 2018-04-19 DIAGNOSIS — E669 Obesity, unspecified: Secondary | ICD-10-CM | POA: Diagnosis not present

## 2018-04-19 DIAGNOSIS — M2550 Pain in unspecified joint: Secondary | ICD-10-CM | POA: Diagnosis not present

## 2018-04-19 DIAGNOSIS — Z6839 Body mass index (BMI) 39.0-39.9, adult: Secondary | ICD-10-CM | POA: Diagnosis not present

## 2018-04-19 DIAGNOSIS — M5136 Other intervertebral disc degeneration, lumbar region: Secondary | ICD-10-CM | POA: Diagnosis not present

## 2018-04-19 DIAGNOSIS — M1009 Idiopathic gout, multiple sites: Secondary | ICD-10-CM | POA: Diagnosis not present

## 2018-04-22 ENCOUNTER — Ambulatory Visit (INDEPENDENT_AMBULATORY_CARE_PROVIDER_SITE_OTHER): Payer: Medicare HMO | Admitting: Orthopedic Surgery

## 2018-04-22 VITALS — BP 130/80 | HR 67 | Ht 68.5 in | Wt 255.0 lb

## 2018-04-22 DIAGNOSIS — S76112D Strain of left quadriceps muscle, fascia and tendon, subsequent encounter: Secondary | ICD-10-CM

## 2018-04-22 MED ORDER — HYDROCODONE-ACETAMINOPHEN 5-325 MG PO TABS: 1.0000 | ORAL_TABLET | Freq: Three times a day (TID) | ORAL | 0 refills | Status: DC | PRN

## 2018-04-22 NOTE — Progress Notes (Signed)
POSTOP VISIT  POD # 16  Chief Complaint  Patient presents with  . Follow-up    Recheck on left quad repair, DOS 02-24-18.    Encounter Diagnosis  Name Primary?  . Quadriceps tendon rupture, left, subsequent encounter s/p repair 02/24/18 Yes    Meds ordered this encounter  Medications  . HYDROcodone-acetaminophen (NORCO/VICODIN) 5-325 MG tablet    Sig: Take 1 tablet by mouth every 8 (eight) hours as needed for moderate pain.    Dispense:  21 tablet    Refill:  18     77 year old male this is postop day 13 or week #8 post repair left quadriceps tendon rupture  He does complain of some right knee pain  His braces at 0 to 30 degrees  We advanced it to 0 to 60 degrees he is weight-bear as tolerated in the brace  We will address his right knee with x-ray and possible injection (received injection from rheumatology in the past)   Encounter Diagnosis  Name Primary?  . Quadriceps tendon rupture, left, subsequent encounter s/p repair 02/24/18 Yes    Follow-up 4 weeks continue brace weight-bear as tolerated 0 to 60 degrees  X-ray right knee possible injection right knee left next visit    Postoperative plan (Work, WB, No orders of the defined types were placed in this encounter. ,FU)  4 wks

## 2018-04-22 NOTE — Patient Instructions (Signed)
For pain take Tylenol 500 mg weight 1 hour if not improved take 800 mg ibuprofen if not improved and pain relieved then take 1 hydrocodone

## 2018-05-20 ENCOUNTER — Encounter: Payer: Self-pay | Admitting: Orthopedic Surgery

## 2018-05-20 ENCOUNTER — Ambulatory Visit: Payer: Medicare HMO | Admitting: Orthopedic Surgery

## 2018-05-20 ENCOUNTER — Ambulatory Visit (INDEPENDENT_AMBULATORY_CARE_PROVIDER_SITE_OTHER): Payer: Medicare HMO

## 2018-05-20 VITALS — BP 133/80 | HR 66 | Ht 68.5 in | Wt 252.0 lb

## 2018-05-20 DIAGNOSIS — S76112D Strain of left quadriceps muscle, fascia and tendon, subsequent encounter: Secondary | ICD-10-CM

## 2018-05-20 DIAGNOSIS — M25561 Pain in right knee: Secondary | ICD-10-CM | POA: Diagnosis not present

## 2018-05-20 MED ORDER — HYDROCODONE-ACETAMINOPHEN 5-325 MG PO TABS: 1.0000 | ORAL_TABLET | Freq: Three times a day (TID) | ORAL | 0 refills | Status: DC | PRN

## 2018-05-20 NOTE — Progress Notes (Signed)
NEW PROBLEM OFFICE VISIT in the global.  Chief Complaint  Patient presents with  . Follow-up    Recheck on left quadriceps tendon rupture, DOS 02-24-18.    77 year old male presents for evaluation of his right knee.  On May 30 he ruptured his quadriceps tendon had a repair is doing well  He says that since he had his surgery and he puts all of his weight on his right knee he is at increased pain  Location of pain medial aspect right knee quality dull ache severity mild except with weightbearing and become severe timing intermittent duration approximately 2-1/2 months context no trauma modifying factors rest associated symptoms stiffness and swelling   Review of Systems  Constitutional: Negative for fever.  Musculoskeletal: Positive for joint pain. Negative for falls.  Skin: Negative for rash.  Neurological: Negative for tingling, sensory change and focal weakness.     Past Medical History:  Diagnosis Date  . Anxiety   . Aortic stenosis    a. 06/2015 Echo: EF 50-55%, mild diff HK, Gr1 DD, mild AS w/ restricted mobility of R coronary and non-coronary cusp, mildly dil Ao root (13mm), mildly dil LA, nl RV fxn, mild TR/PR, PASP 28mmHg.  . Arthritis   . Depression   . Essential hypertension   . GERD (gastroesophageal reflux disease)   . Numbness and tingling    fingertips bilat   . Osteoarthritis    a. R>L hip pending R THA.  . Sleep apnea    a. pt states does not use CPAP machine   . Varicose veins     Past Surgical History:  Procedure Laterality Date  . ENDOVENOUS ABLATION SAPHENOUS VEIN W/ LASER Right 05-31-2014   EVLA  right gretaer saphenous vein by Curt Jews MD  . LUMBAR LAMINECTOMY/DECOMPRESSION MICRODISCECTOMY N/A 09/04/2015   Procedure: MICRO LUMBAR DECOMPRESSION L4 - L5,  L3 - L4 2 LEVELS;  Surgeon: Susa Day, MD;  Location: [facility];  Service: Orthopedics;  Laterality: N/A;  . QUADRICEPS TENDON REPAIR Left 02/24/2018   Procedure: LEFT QUADRICEPS TENDON REPAIR;   Surgeon: Carole Civil, MD;  Location: [facility];  Service: Orthopedics;  Laterality: Left;  . TOTAL HIP ARTHROPLASTY Right 03/26/2016   Procedure: RIGHT TOTAL HIP ARTHROPLASTY ANTERIOR APPROACH;  Surgeon: Rod Can, MD;  Location: [facility];  Service: Orthopedics;  Laterality: Right;  . TOTAL HIP ARTHROPLASTY Left 07/30/2016   Procedure: TOTAL HIP ARTHROPLASTY ANTERIOR APPROACH;  Surgeon: Rod Can, MD;  Location: [facility];  Service: Orthopedics;  Laterality: Left;    Family History  Problem Relation Age of Onset  . Kidney disease Mother    Social History   Tobacco Use  . Smoking status: Former Smoker    Years: 5.00    Types: Cigars    Last attempt to quit: 09/28/1968    Years since quitting: 49.6  . Smokeless tobacco: Never Used  Substance Use Topics  . Alcohol use: No  . Drug use: No    Allergies  Allergen Reactions  . Shrimp [Shellfish Allergy] Other (See Comments)    Flares up gout    No outpatient medications have been marked as taking for the 05/20/18 encounter (Office Visit) with Carole Civil, MD.    BP 133/80   Pulse 66   Ht 5' 8.5" (1.74 m)   Wt 252 lb (114.3 kg)   BMI 37.76 kg/m   Physical Exam  Constitutional: He is oriented to person, place, and time. He appears well-developed and well-nourished.  Vital  signs have been reviewed and are stable. Gen. appearance the patient is well-developed and well-nourished with normal grooming and hygiene.   Neurological: He is alert and oriented to person, place, and time.  Skin: Skin is warm and dry. No erythema.  Psychiatric: He has a normal mood and affect.  Vitals reviewed.   Ortho Exam  The patient walks without support except for the brace we have on his left knee for surgery he can bend and straighten his left knee with no extensor lag and has no significant discomfort although he has some soft tissue tenderness his knee remains stable skin incision is clean he has mild distal edema and normal  sensation in the left leg  New problem right leg inspection palpation reveals tenderness over the medial joint line small joint effusion knee range of motion 110 degrees with 5 degree extension loss and flexion of 115 degrees in the AP plane is stable in the coronal plane he stable his quad strength is normal skin is intact he has a good pulse normal sensation no pathologic reflexes is overall balance and coordination are normal  MEDICAL DECISION SECTION  Xrays were done at [place] office  My independent reading of xrays:  Arthritis medial compartment right knee see dictated report   Encounter Diagnoses  Name Primary?  . Quadriceps tendon rupture, left, subsequent encounter s/p repair 02/24/18   . Right knee pain, unspecified chronicity Yes    PLAN: (Rx., injectx, surgery, frx, mri/ct) Recommend injection right knee Procedure note right knee injection verbal consent was obtained to inject right knee joint  Timeout was completed to confirm the site of injection  The medications used were 40 mg of Depo-Medrol and 1% lidocaine 3 cc  Anesthesia was provided by ethyl chloride and the skin was prepped with alcohol.  After cleaning the skin with alcohol a 20-gauge needle was used to inject the right knee joint. There were no complications. A sterile bandage was applied.  As far as his left knee goes he can take his brace off  I gave him his last prescription for pain medication and advised him to use topical medications for the soreness in his left knee  Meds ordered this encounter  Medications  . HYDROcodone-acetaminophen (NORCO/VICODIN) 5-325 MG tablet    Sig: Take 1 tablet by mouth every 8 (eight) hours as needed for moderate pain.    Dispense:  21 tablet    Refill:  0    Arther Abbott, MD  05/20/2018 12:26 PM

## 2018-05-20 NOTE — Patient Instructions (Signed)
These are the muscle and arthrits creams I recommend:  PLEASE READ THE PACKAGE INSTRUCTIONS BEFORE USING   Ben Gay arthritis cream  Icy hot vanishing gel  Aspercreme odor free  Myoflex Oderless pain reliever  Capzasin  Sportscreme  Max freeze  

## 2018-06-17 DIAGNOSIS — Z23 Encounter for immunization: Secondary | ICD-10-CM | POA: Diagnosis not present

## 2018-06-30 DIAGNOSIS — R062 Wheezing: Secondary | ICD-10-CM | POA: Diagnosis not present

## 2018-06-30 DIAGNOSIS — H6123 Impacted cerumen, bilateral: Secondary | ICD-10-CM | POA: Diagnosis not present

## 2018-07-27 DIAGNOSIS — Z79899 Other long term (current) drug therapy: Secondary | ICD-10-CM | POA: Diagnosis not present

## 2018-07-27 DIAGNOSIS — K219 Gastro-esophageal reflux disease without esophagitis: Secondary | ICD-10-CM | POA: Diagnosis not present

## 2018-07-27 DIAGNOSIS — E7800 Pure hypercholesterolemia, unspecified: Secondary | ICD-10-CM | POA: Diagnosis not present

## 2018-07-27 DIAGNOSIS — I77811 Abdominal aortic ectasia: Secondary | ICD-10-CM | POA: Diagnosis not present

## 2018-07-27 DIAGNOSIS — R7301 Impaired fasting glucose: Secondary | ICD-10-CM | POA: Diagnosis not present

## 2018-07-27 DIAGNOSIS — I10 Essential (primary) hypertension: Secondary | ICD-10-CM | POA: Diagnosis not present

## 2018-07-27 DIAGNOSIS — M109 Gout, unspecified: Secondary | ICD-10-CM | POA: Diagnosis not present

## 2018-07-27 DIAGNOSIS — G4700 Insomnia, unspecified: Secondary | ICD-10-CM | POA: Diagnosis not present

## 2018-10-15 IMAGING — DX DG KNEE COMPLETE 4+V*L*
4 series · 4 of 4 positions shown · non-contrast
Comparison: None.

CLINICAL DATA: [place] steps at church.  Left anterior knee pain.

EXAM:
LEFT KNEE - COMPLETE 4+ VIEW

[knee ap]
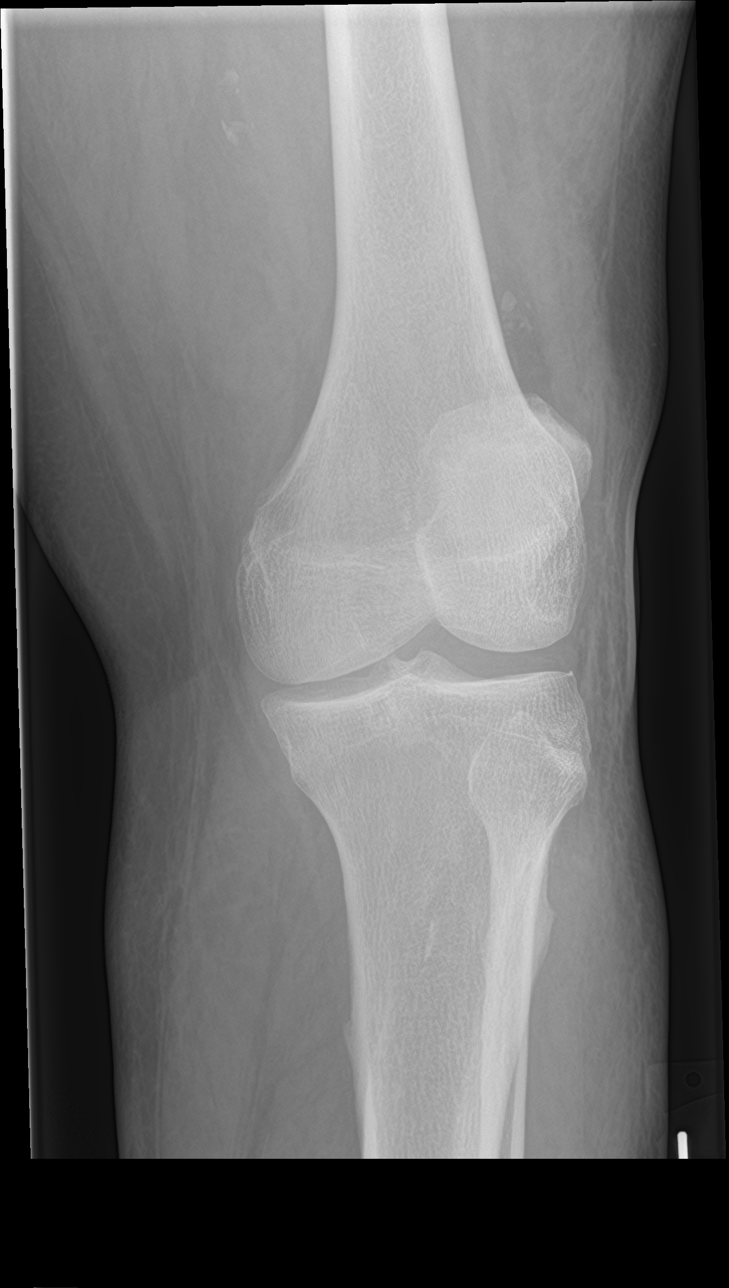

[tunnel]
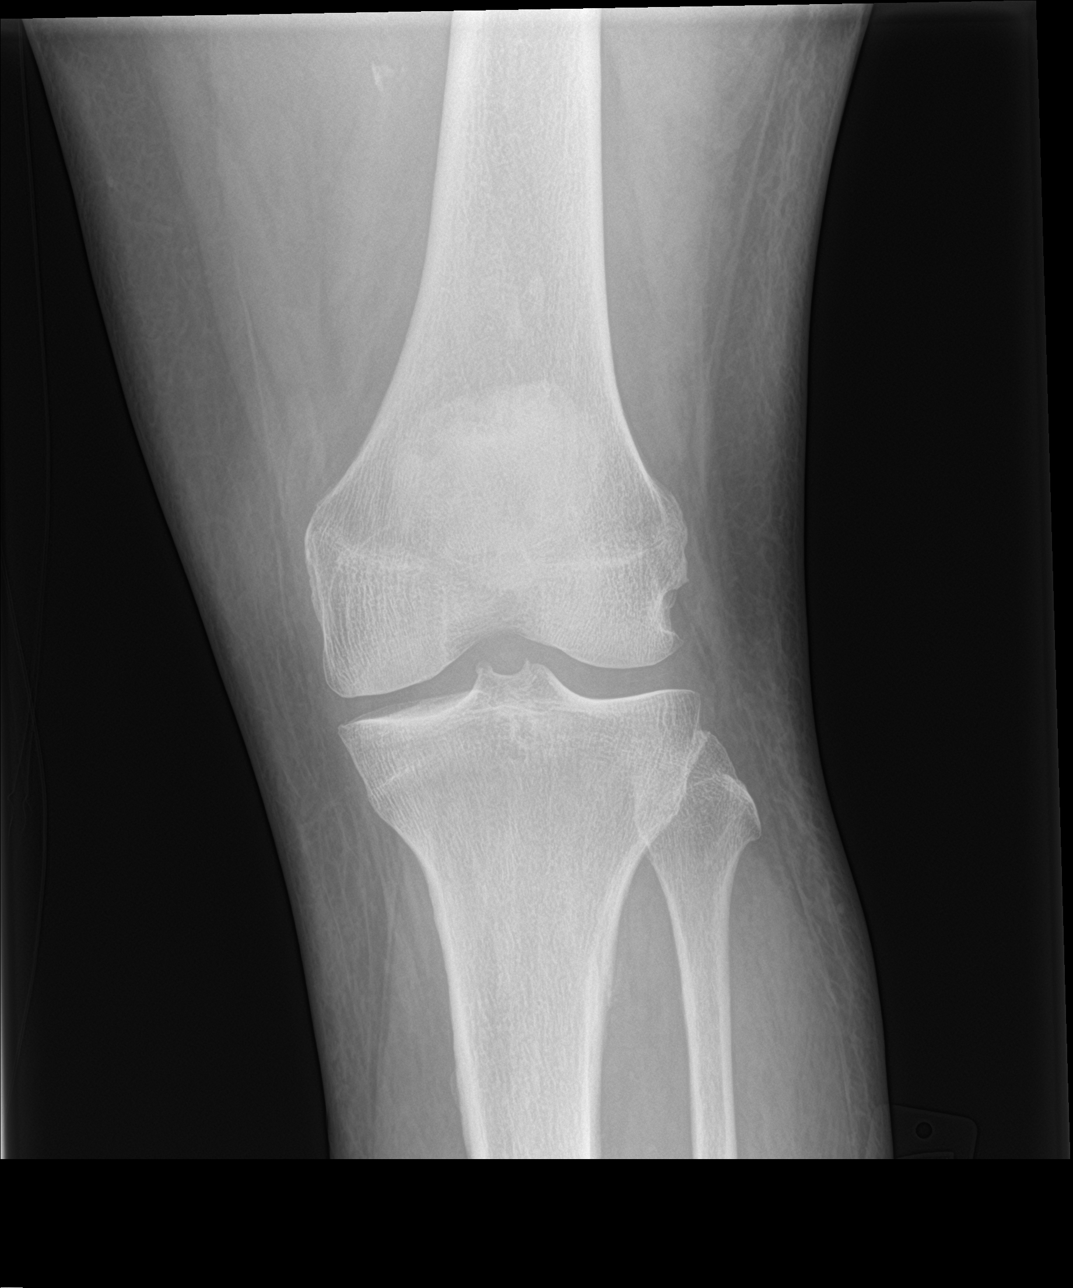

[knee lat]
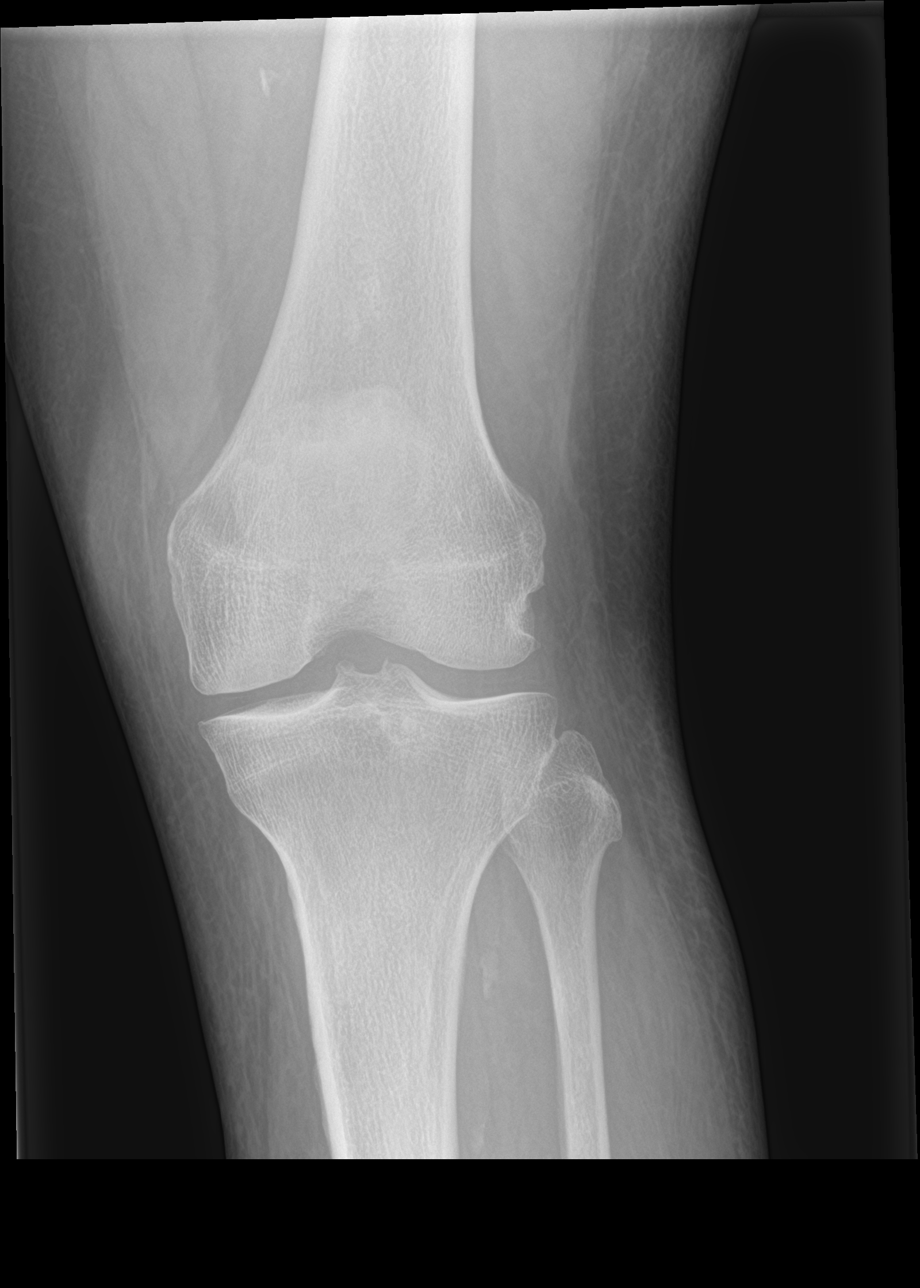

[knee sunrise]
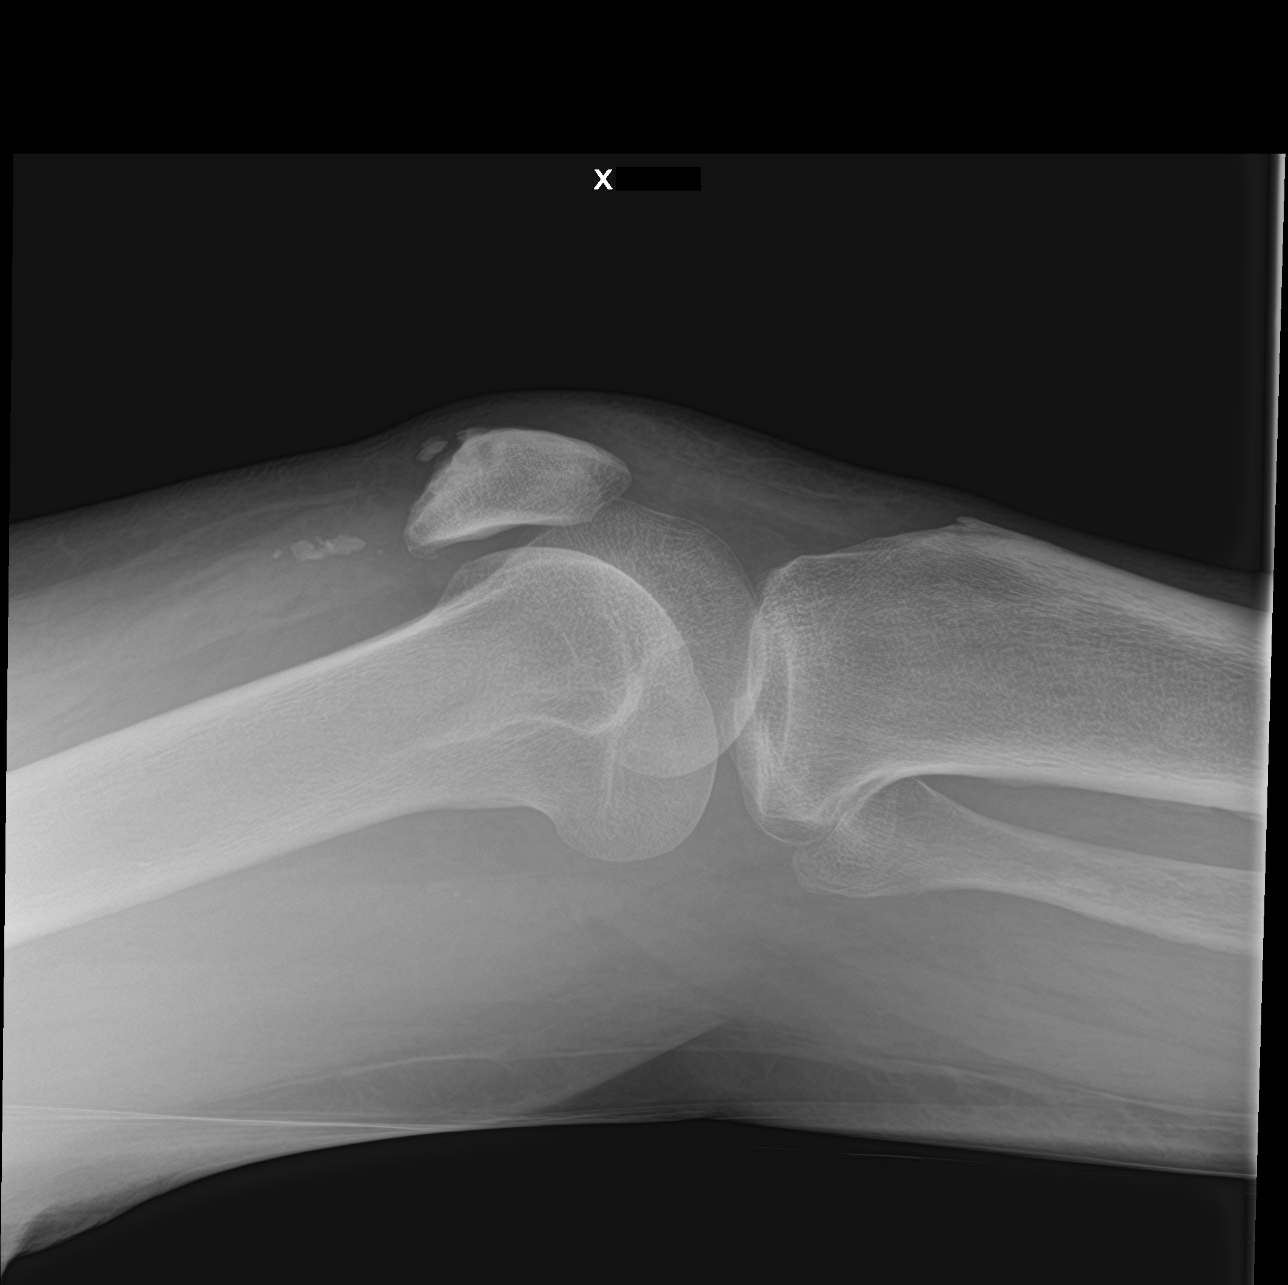

[4 of 4 positions shown; findings below may reference images not displayed]

FINDINGS: There small bone fragments, which lie above the patella, likely from
a fractured enthesophyte at the quadriceps insertion.

No other evidence of a fracture.

Knee joint is normally spaced and aligned.

Small joint effusion.
IMPRESSION: 1. Findings are consistent with fracture of an enthesophyte at the
quadriceps tendon insertion on the superior patella. There is a
small associated joint effusion. Consider a quadriceps tendon
disruption if the patient has difficulty with the extension. This
could be further assessed with knee MRI.
2. No other evidence of a fracture.

## 2018-10-18 ENCOUNTER — Other Ambulatory Visit: Payer: Self-pay | Admitting: Orthopedic Surgery

## 2019-01-27 DIAGNOSIS — E7800 Pure hypercholesterolemia, unspecified: Secondary | ICD-10-CM | POA: Diagnosis not present

## 2019-01-27 DIAGNOSIS — M109 Gout, unspecified: Secondary | ICD-10-CM | POA: Diagnosis not present

## 2019-01-27 DIAGNOSIS — R7301 Impaired fasting glucose: Secondary | ICD-10-CM | POA: Diagnosis not present

## 2019-01-27 DIAGNOSIS — Z79899 Other long term (current) drug therapy: Secondary | ICD-10-CM | POA: Diagnosis not present

## 2019-01-31 DIAGNOSIS — Z79899 Other long term (current) drug therapy: Secondary | ICD-10-CM | POA: Diagnosis not present

## 2019-01-31 DIAGNOSIS — I10 Essential (primary) hypertension: Secondary | ICD-10-CM | POA: Diagnosis not present

## 2019-01-31 DIAGNOSIS — E7800 Pure hypercholesterolemia, unspecified: Secondary | ICD-10-CM | POA: Diagnosis not present

## 2019-01-31 DIAGNOSIS — M1990 Unspecified osteoarthritis, unspecified site: Secondary | ICD-10-CM | POA: Diagnosis not present

## 2019-01-31 DIAGNOSIS — F419 Anxiety disorder, unspecified: Secondary | ICD-10-CM | POA: Diagnosis not present

## 2019-01-31 DIAGNOSIS — M109 Gout, unspecified: Secondary | ICD-10-CM | POA: Diagnosis not present

## 2019-01-31 DIAGNOSIS — Z0001 Encounter for general adult medical examination with abnormal findings: Secondary | ICD-10-CM | POA: Diagnosis not present

## 2019-01-31 DIAGNOSIS — G4700 Insomnia, unspecified: Secondary | ICD-10-CM | POA: Diagnosis not present

## 2019-01-31 DIAGNOSIS — I77811 Abdominal aortic ectasia: Secondary | ICD-10-CM | POA: Diagnosis not present

## 2019-05-30 DIAGNOSIS — H524 Presbyopia: Secondary | ICD-10-CM | POA: Diagnosis not present

## 2019-06-20 ENCOUNTER — Encounter (HOSPITAL_COMMUNITY): Payer: Self-pay | Admitting: Emergency Medicine

## 2019-06-20 ENCOUNTER — Other Ambulatory Visit: Payer: Self-pay

## 2019-06-20 ENCOUNTER — Emergency Department (HOSPITAL_COMMUNITY)
Admission: EM | Admit: 2019-06-20 | Discharge: 2019-06-20 | Disposition: A | Discharge status: Home / Self Care | Payer: Medicare HMO | Attending: Emergency Medicine | Admitting: Emergency Medicine

## 2019-06-20 DIAGNOSIS — I10 Essential (primary) hypertension: Secondary | ICD-10-CM | POA: Diagnosis not present

## 2019-06-20 DIAGNOSIS — Z87891 Personal history of nicotine dependence: Secondary | ICD-10-CM | POA: Insufficient documentation

## 2019-06-20 DIAGNOSIS — K625 Hemorrhage of anus and rectum: Secondary | ICD-10-CM

## 2019-06-20 DIAGNOSIS — Z79899 Other long term (current) drug therapy: Secondary | ICD-10-CM | POA: Insufficient documentation

## 2019-06-20 DIAGNOSIS — Z7982 Long term (current) use of aspirin: Secondary | ICD-10-CM | POA: Insufficient documentation

## 2019-06-20 LAB — CBC WITH DIFFERENTIAL/PLATELET
Abs Immature Granulocytes: 0.02 10*3/uL (ref 0.00–0.07)
Basophils Absolute: 0 10*3/uL (ref 0.0–0.1)
Basophils Relative: 0 %
Eosinophils Absolute: 0.2 10*3/uL (ref 0.0–0.5)
Eosinophils Relative: 3 %
HCT: 39.8 % (ref 39.0–52.0)
Hemoglobin: 12.8 g/dL — ABNORMAL LOW (ref 13.0–17.0)
Immature Granulocytes: 0 %
Lymphocytes Relative: 32 %
Lymphs Abs: 1.8 10*3/uL (ref 0.7–4.0)
MCH: 32.7 pg (ref 26.0–34.0)
MCHC: 32.2 g/dL (ref 30.0–36.0)
MCV: 101.8 fL — ABNORMAL HIGH (ref 80.0–100.0)
Monocytes Absolute: 0.5 10*3/uL (ref 0.1–1.0)
Monocytes Relative: 9 %
Neutro Abs: 3.2 10*3/uL (ref 1.7–7.7)
Neutrophils Relative %: 56 %
Platelets: 183 10*3/uL (ref 150–400)
RBC: 3.91 MIL/uL — ABNORMAL LOW (ref 4.22–5.81)
RDW: 13 % (ref 11.5–15.5)
WBC: 5.8 10*3/uL (ref 4.0–10.5)
nRBC: 0 % (ref 0.0–0.2)

## 2019-06-20 LAB — COMPREHENSIVE METABOLIC PANEL
ALT: 22 U/L (ref 0–44)
AST: 33 U/L (ref 15–41)
Albumin: 4.3 g/dL (ref 3.5–5.0)
Alkaline Phosphatase: 118 U/L (ref 38–126)
Anion gap: 9 (ref 5–15)
BUN: 16 mg/dL (ref 8–23)
CO2: 26 mmol/L (ref 22–32)
Calcium: 9.3 mg/dL (ref 8.9–10.3)
Chloride: 103 mmol/L (ref 98–111)
Creatinine, Ser: 0.94 mg/dL (ref 0.61–1.24)
GFR calc Af Amer: >60 mL/min (ref >60)
GFR calc non Af Amer: >60 mL/min (ref >60)
Glucose, Bld: 115 mg/dL — ABNORMAL HIGH (ref 70–99)
Potassium: 3.6 mmol/L (ref 3.5–5.1)
Sodium: 138 mmol/L (ref 135–145)
Total Bilirubin: 0.7 mg/dL (ref 0.3–1.2)
Total Protein: 8 g/dL (ref 6.5–8.1)

## 2019-06-20 LAB — POC OCCULT BLOOD, ED: Fecal Occult Bld: POSITIVE — AB

## 2019-06-20 NOTE — Discharge Instructions (Addendum)
Follow-up with your stomach doctor in a week.  Return if bleeding becomes severe.   Do not take any more Advil or Aleve.  He can take Tylenol for pain

## 2019-06-20 NOTE — ED Provider Notes (Addendum)
[facility] EMERGENCY DEPARTMENT Provider Note   CSN: [id number] Arrival date & time: 06/20/19  1619     History   Chief Complaint Chief Complaint  Patient presents with  . Rectal Bleeding    HPI John Hanson is a 78 y.o. male.     Patient states he had one or 2 episodes of rectal bleeding.  None within the last few hours  The history is provided by the patient. No language interpreter was used.  Rectal Bleeding Quality:  Bright red Amount:  Moderate Timing:  Intermittent Chronicity:  New Context: not anal fissures   Similar prior episodes: no   Relieved by:  Nothing Worsened by:  Nothing Ineffective treatments:  None tried Associated symptoms: no abdominal pain   Risk factors: no anticoagulant use     Past Medical History:  Diagnosis Date  . Anxiety   . Aortic stenosis    a. 06/2015 Echo: EF 50-55%, mild diff HK, Gr1 DD, mild AS w/ restricted mobility of R coronary and non-coronary cusp, mildly dil Ao root (48mm), mildly dil LA, nl RV fxn, mild TR/PR, PASP 48mmHg.  . Arthritis   . Depression   . Essential hypertension   . GERD (gastroesophageal reflux disease)   . Numbness and tingling    fingertips bilat   . Osteoarthritis    a. R>L hip pending R THA.  . Sleep apnea    a. pt states does not use CPAP machine   . Varicose veins     Patient Active Problem List   Diagnosis Date Noted  . Quadriceps tendon rupture, left, subsequent encounter s/p repair 02/24/18   . Swelling of limb 06/02/2017  . DOE (dyspnea on exertion) 05/21/2017  . Ascending aorta dilatation (HCC) 05/21/2017  . Primary osteoarthritis of left hip 07/30/2016  . Primary osteoarthritis of right hip 03/26/2016  . Essential hypertension   . Aortic valve stenosis   . Osteoarthritis   . Spinal stenosis of lumbar region 09/04/2015  . HTN (hypertension) 07/18/2015  . Murmur 07/18/2015  . Preoperative cardiovascular examination 07/18/2015  . Varicose veins of lower extremities with  other complications [id number]  . Pain in limb 02/08/2014    Past Surgical History:  Procedure Laterality Date  . ENDOVENOUS ABLATION SAPHENOUS VEIN W/ LASER Right 05-31-2014   EVLA  right gretaer saphenous vein by Curt Jews MD  . LUMBAR LAMINECTOMY/DECOMPRESSION MICRODISCECTOMY N/A 09/04/2015   Procedure: MICRO LUMBAR DECOMPRESSION L4 - L5,  L3 - L4 2 LEVELS;  Surgeon: Susa Day, MD;  Location: [facility];  Service: Orthopedics;  Laterality: N/A;  . QUADRICEPS TENDON REPAIR Left 02/24/2018   Procedure: LEFT QUADRICEPS TENDON REPAIR;  Surgeon: Carole Civil, MD;  Location: [facility];  Service: Orthopedics;  Laterality: Left;  . TOTAL HIP ARTHROPLASTY Right 03/26/2016   Procedure: RIGHT TOTAL HIP ARTHROPLASTY ANTERIOR APPROACH;  Surgeon: Rod Can, MD;  Location: [facility];  Service: Orthopedics;  Laterality: Right;  . TOTAL HIP ARTHROPLASTY Left 07/30/2016   Procedure: TOTAL HIP ARTHROPLASTY ANTERIOR APPROACH;  Surgeon: Rod Can, MD;  Location: [facility];  Service: Orthopedics;  Laterality: Left;        Home Medications    Prior to Admission medications   Medication Sig Start Date End Date Taking? Authorizing Provider  allopurinol (ZYLOPRIM) 300 MG tablet Take 300 mg by mouth daily.    [provider]  amLODipine (NORVASC) 10 MG tablet Take 10 mg by mouth daily.    [provider]  aspirin EC 81  MG tablet Take 81 mg by mouth daily.    [provider]  atorvastatin (LIPITOR) 20 MG tablet  03/21/18   [provider]  citalopram (CELEXA) 40 MG tablet Take 40 mg by mouth daily.    [provider]  furosemide (LASIX) 20 MG tablet Take 1 tablet (20 mg total) by mouth daily. 03/24/16   Theora Gianotti, NP  HYDROcodone-acetaminophen (NORCO/VICODIN) 5-325 MG tablet Take 1 tablet by mouth every 8 (eight) hours as needed for moderate pain. 05/20/18   Carole Civil, MD  ibuprofen (ADVIL,MOTRIN) 800 MG tablet TAKE 1 TABLET EVERY 8  HOURS AS NEEDED 10/19/18   Carole Civil, MD  irbesartan (AVAPRO) 300 MG tablet Take 300 mg by mouth daily.     [provider]  labetalol (NORMODYNE) 300 MG tablet Take 300 mg by mouth 3 (three) times daily.    [provider]  omeprazole (PRILOSEC) 40 MG capsule Take 40 mg by mouth daily.    [provider]  traZODone (DESYREL) 150 MG tablet  01/22/18   [provider]    Family History Family History  Problem Relation Age of Onset  . Kidney disease Mother     Social History Social History   Tobacco Use  . Smoking status: Former Smoker    Years: 5.00    Types: Cigars    Quit date: 09/28/1968    Years since quitting: 50.7  . Smokeless tobacco: Never Used  Substance Use Topics  . Alcohol use: No  . Drug use: No     Allergies   Shrimp [shellfish allergy]   Review of Systems Review of Systems  Constitutional: Negative for appetite change and fatigue.  HENT: Negative for congestion, ear discharge and sinus pressure.   Eyes: Negative for discharge.  Respiratory: Negative for cough.   Cardiovascular: Negative for chest pain.  Gastrointestinal: Positive for hematochezia. Negative for abdominal pain and diarrhea.       Rectal bleeding  Genitourinary: Negative for frequency and hematuria.  Musculoskeletal: Negative for back pain.  Skin: Negative for rash.  Neurological: Negative for seizures and headaches.  Psychiatric/Behavioral: Negative for hallucinations.     Physical Exam Updated Vital Signs BP 138/83 (BP Location: Right Arm)   Pulse 85   Temp 97.7 F (36.5 C) (Oral)   Resp 18   Ht 5\' 8"  (1.727 m)   Wt 113.4 kg   SpO2 97%   BMI 38.01 kg/m   Physical Exam Nursing note reviewed.  Constitutional:      Appearance: He is well-developed.  HENT:     Head: Normocephalic.     Nose: Nose normal.  Eyes:     General: No scleral icterus.    Conjunctiva/sclera: Conjunctivae normal.  Neck:     Musculoskeletal: Neck supple.      Thyroid: No thyromegaly.  Cardiovascular:     Rate and Rhythm: Normal rate and regular rhythm.     Heart sounds: No murmur. No friction rub. No gallop.   Pulmonary:     Breath sounds: No stridor. No wheezing or rales.  Chest:     Chest wall: No tenderness.  Abdominal:     General: There is no distension.     Tenderness: There is no abdominal tenderness. There is no rebound.  Genitourinary:    Comments: Normal rectal exam heme positive Musculoskeletal: Normal range of motion.  Lymphadenopathy:     Cervical: No cervical adenopathy.  Skin:    Findings: No erythema or rash.  Neurological:     Mental Status: He is oriented to person, place, and time.     Motor: No abnormal muscle tone.     Coordination: Coordination normal.  Psychiatric:        Behavior: Behavior normal.      ED Treatments / Results  Labs (all labs ordered are listed, but only abnormal results are displayed) Labs Reviewed  CBC WITH DIFFERENTIAL/PLATELET - Abnormal; Notable for the following components:      Result Value   RBC 3.91 (*)    Hemoglobin 12.8 (*)    MCV 101.8 (*)    All other components within normal limits  COMPREHENSIVE METABOLIC PANEL - Abnormal; Notable for the following components:   Glucose, Bld 115 (*)    All other components within normal limits  POC OCCULT BLOOD, ED - Abnormal; Notable for the following components:   Fecal Occult Bld POSITIVE (*)    All other components within normal limits    EKG None  Radiology No results found.  Procedures Procedures (including critical care time)  Medications Ordered in ED Medications - No data to display   Initial Impression / Assessment and Plan / ED Course  I have reviewed the triage vital signs and the nursing notes.  Pertinent labs & imaging results that were available during my care of the patient were reviewed by me and considered in my medical decision making (see chart for details).        Patient with 1 or 2 episodes  of rectal bleeding.  He has heme positive but no orthostatics and normal hemoglobin.  He will follow-up with GI and stop taking his NSAID  Final Clinical Impressions(s) / ED Diagnoses   Final diagnoses:  Rectal bleeding    ED Discharge Orders    None       Milton Ferguson, MD 06/20/19 2216    Milton Ferguson, MD 06/22/19 1154

## 2019-06-20 NOTE — ED Triage Notes (Signed)
Pt c/o bright red bleeding in stool that began yesterday. Pt denies any pain. Denies hx of hemorrhoids. States bleeding only occurs when he has a bowel movement.

## 2019-06-23 DIAGNOSIS — Z23 Encounter for immunization: Secondary | ICD-10-CM | POA: Diagnosis not present

## 2019-07-13 DIAGNOSIS — K921 Melena: Secondary | ICD-10-CM | POA: Diagnosis not present

## 2019-07-13 DIAGNOSIS — Z86010 Personal history of colonic polyps: Secondary | ICD-10-CM | POA: Diagnosis not present

## 2019-07-19 DIAGNOSIS — H40013 Open angle with borderline findings, low risk, bilateral: Secondary | ICD-10-CM | POA: Diagnosis not present

## 2019-08-09 DIAGNOSIS — H401131 Primary open-angle glaucoma, bilateral, mild stage: Secondary | ICD-10-CM | POA: Diagnosis not present

## 2019-08-11 DIAGNOSIS — H5203 Hypermetropia, bilateral: Secondary | ICD-10-CM | POA: Diagnosis not present

## 2019-08-11 DIAGNOSIS — H524 Presbyopia: Secondary | ICD-10-CM | POA: Diagnosis not present

## 2019-08-11 DIAGNOSIS — H5213 Myopia, bilateral: Secondary | ICD-10-CM | POA: Diagnosis not present

## 2019-08-11 DIAGNOSIS — H52209 Unspecified astigmatism, unspecified eye: Secondary | ICD-10-CM | POA: Diagnosis not present

## 2019-08-11 DIAGNOSIS — K921 Melena: Secondary | ICD-10-CM | POA: Diagnosis not present

## 2019-08-30 DIAGNOSIS — H401131 Primary open-angle glaucoma, bilateral, mild stage: Secondary | ICD-10-CM | POA: Diagnosis not present

## 2019-11-17 ENCOUNTER — Other Ambulatory Visit: Payer: Self-pay | Admitting: Orthopedic Surgery

## 2019-11-28 DIAGNOSIS — H401121 Primary open-angle glaucoma, left eye, mild stage: Secondary | ICD-10-CM | POA: Diagnosis not present

## 2019-11-28 DIAGNOSIS — H11133 Conjunctival pigmentations, bilateral: Secondary | ICD-10-CM | POA: Diagnosis not present

## 2019-11-28 DIAGNOSIS — H401112 Primary open-angle glaucoma, right eye, moderate stage: Secondary | ICD-10-CM | POA: Diagnosis not present

## 2019-11-28 DIAGNOSIS — H18413 Arcus senilis, bilateral: Secondary | ICD-10-CM | POA: Diagnosis not present

## 2019-11-28 DIAGNOSIS — H11153 Pinguecula, bilateral: Secondary | ICD-10-CM | POA: Diagnosis not present

## 2019-11-28 DIAGNOSIS — H2513 Age-related nuclear cataract, bilateral: Secondary | ICD-10-CM | POA: Diagnosis not present

## 2019-12-23 ENCOUNTER — Emergency Department (HOSPITAL_COMMUNITY): Payer: Medicare HMO

## 2019-12-23 ENCOUNTER — Other Ambulatory Visit: Payer: Self-pay

## 2019-12-23 ENCOUNTER — Encounter (HOSPITAL_COMMUNITY): Payer: Self-pay | Admitting: Emergency Medicine

## 2019-12-23 ENCOUNTER — Emergency Department (HOSPITAL_COMMUNITY)
Admission: EM | Admit: 2019-12-23 | Discharge: 2019-12-23 | Disposition: A | Discharge status: Home / Self Care | Payer: Medicare HMO | Attending: Emergency Medicine | Admitting: Emergency Medicine

## 2019-12-23 DIAGNOSIS — Z79899 Other long term (current) drug therapy: Secondary | ICD-10-CM | POA: Insufficient documentation

## 2019-12-23 DIAGNOSIS — Z87891 Personal history of nicotine dependence: Secondary | ICD-10-CM | POA: Insufficient documentation

## 2019-12-23 DIAGNOSIS — R0789 Other chest pain: Secondary | ICD-10-CM | POA: Insufficient documentation

## 2019-12-23 DIAGNOSIS — Z96642 Presence of left artificial hip joint: Secondary | ICD-10-CM | POA: Insufficient documentation

## 2019-12-23 DIAGNOSIS — Z7982 Long term (current) use of aspirin: Secondary | ICD-10-CM | POA: Insufficient documentation

## 2019-12-23 DIAGNOSIS — R079 Chest pain, unspecified: Secondary | ICD-10-CM | POA: Diagnosis not present

## 2019-12-23 DIAGNOSIS — Z96641 Presence of right artificial hip joint: Secondary | ICD-10-CM | POA: Diagnosis not present

## 2019-12-23 DIAGNOSIS — I10 Essential (primary) hypertension: Secondary | ICD-10-CM | POA: Insufficient documentation

## 2019-12-23 LAB — CBC
HCT: 35.7 % — ABNORMAL LOW (ref 39.0–52.0)
Hemoglobin: 12 g/dL — ABNORMAL LOW (ref 13.0–17.0)
MCH: 33.6 pg (ref 26.0–34.0)
MCHC: 33.6 g/dL (ref 30.0–36.0)
MCV: 100 fL (ref 80.0–100.0)
Platelets: 170 10*3/uL (ref 150–400)
RBC: 3.57 MIL/uL — ABNORMAL LOW (ref 4.22–5.81)
RDW: 12.8 % (ref 11.5–15.5)
WBC: 6.2 10*3/uL (ref 4.0–10.5)
nRBC: 0 % (ref 0.0–0.2)

## 2019-12-23 LAB — BASIC METABOLIC PANEL
Anion gap: 7 (ref 5–15)
BUN: 20 mg/dL (ref 8–23)
CO2: 26 mmol/L (ref 22–32)
Calcium: 8.8 mg/dL — ABNORMAL LOW (ref 8.9–10.3)
Chloride: 107 mmol/L (ref 98–111)
Creatinine, Ser: 1.17 mg/dL (ref 0.61–1.24)
GFR calc Af Amer: >60 mL/min (ref >60)
GFR calc non Af Amer: 60 mL/min — ABNORMAL LOW (ref >60)
Glucose, Bld: 106 mg/dL — ABNORMAL HIGH (ref 70–99)
Potassium: 3.1 mmol/L — ABNORMAL LOW (ref 3.5–5.1)
Sodium: 140 mmol/L (ref 135–145)

## 2019-12-23 LAB — TROPONIN I (HIGH SENSITIVITY)
Troponin I (High Sensitivity): <2 ng/L (ref <18)
Troponin I (High Sensitivity): <2 ng/L (ref <18)

## 2019-12-23 NOTE — ED Triage Notes (Signed)
Patient states that he has been having chest pain  Since 10 pm last night. Patient is in the center of his chest and states pain upon palpation/stabbing pain. Pain is at an 6. Patient does state that he had moved some boxes on Thursday.

## 2019-12-23 NOTE — ED Provider Notes (Signed)
[facility] EMERGENCY DEPARTMENT Provider Note   CSN: [id number] Arrival date & time: 12/23/19  0402     History Chief Complaint  Patient presents with  . Chest Pain    John Hanson is a 79 y.o. male.  79 yo M with a chief complaint of left-sided chest pain.  This is sharp is worse with moving his left arm.  Lifted some heavy boxes a couple days ago and then it started.  Not exertional no appreciable shortness of breath.  No cough no fever.  Denies history of MI.  Has a history of hypertension.  Unsure of hyperlipidemia denies diabetes denies smoking denies family history of MI.  Denies history of PE or DVT denies hemoptysis denies any lower extremity edema denies history of cancer denies recent surgery immobilization or hospitalization.  The history is provided by the patient.  Chest Pain Pain location:  L chest Pain quality: sharp   Pain radiates to:  Does not radiate Pain severity:  Moderate Onset quality:  Gradual Duration:  2 days Timing:  Constant Progression:  Unchanged Chronicity:  New Relieved by:  Nothing Worsened by:  Certain positions and movement Ineffective treatments:  None tried Associated symptoms: no abdominal pain, no fever, no headache, no palpitations, no shortness of breath and no vomiting        Past Medical History:  Diagnosis Date  . Anxiety   . Aortic stenosis    a. 06/2015 Echo: EF 50-55%, mild diff HK, Gr1 DD, mild AS w/ restricted mobility of R coronary and non-coronary cusp, mildly dil Ao root (54mm), mildly dil LA, nl RV fxn, mild TR/PR, PASP 12mmHg.  . Arthritis   . Depression   . Essential hypertension   . GERD (gastroesophageal reflux disease)   . Numbness and tingling    fingertips bilat   . Osteoarthritis    a. R>L hip pending R THA.  . Sleep apnea    a. pt states does not use CPAP machine   . Varicose veins     Patient Active Problem List   Diagnosis Date Noted  . Quadriceps tendon rupture, left, subsequent encounter  s/p repair 02/24/18   . Swelling of limb 06/02/2017  . DOE (dyspnea on exertion) 05/21/2017  . Ascending aorta dilatation (HCC) 05/21/2017  . Primary osteoarthritis of left hip 07/30/2016  . Primary osteoarthritis of right hip 03/26/2016  . Essential hypertension   . Aortic valve stenosis   . Osteoarthritis   . Spinal stenosis of lumbar region 09/04/2015  . HTN (hypertension) 07/18/2015  . Murmur 07/18/2015  . Preoperative cardiovascular examination 07/18/2015  . Varicose veins of lower extremities with other complications [id number]  . Pain in limb 02/08/2014    Past Surgical History:  Procedure Laterality Date  . ENDOVENOUS ABLATION SAPHENOUS VEIN W/ LASER Right 05-31-2014   EVLA  right gretaer saphenous vein by Curt Jews MD  . LUMBAR LAMINECTOMY/DECOMPRESSION MICRODISCECTOMY N/A 09/04/2015   Procedure: MICRO LUMBAR DECOMPRESSION L4 - L5,  L3 - L4 2 LEVELS;  Surgeon: Susa Day, MD;  Location: [facility];  Service: Orthopedics;  Laterality: N/A;  . QUADRICEPS TENDON REPAIR Left 02/24/2018   Procedure: LEFT QUADRICEPS TENDON REPAIR;  Surgeon: Carole Civil, MD;  Location: [facility];  Service: Orthopedics;  Laterality: Left;  . TOTAL HIP ARTHROPLASTY Right 03/26/2016   Procedure: RIGHT TOTAL HIP ARTHROPLASTY ANTERIOR APPROACH;  Surgeon: Rod Can, MD;  Location: [facility];  Service: Orthopedics;  Laterality: Right;  . TOTAL HIP ARTHROPLASTY Left 07/30/2016  Procedure: TOTAL HIP ARTHROPLASTY ANTERIOR APPROACH;  Surgeon: Rod Can, MD;  Location: [facility];  Service: Orthopedics;  Laterality: Left;       Family History  Problem Relation Age of Onset  . Kidney disease Mother     Social History   Tobacco Use  . Smoking status: Former Smoker    Years: 5.00    Types: Cigars    Quit date: 09/28/1968    Years since quitting: 51.2  . Smokeless tobacco: Never Used  Substance Use Topics  . Alcohol use: No  . Drug use: No    Home Medications Prior to Admission medications    Medication Sig Start Date End Date Taking? Authorizing Provider  allopurinol (ZYLOPRIM) 300 MG tablet Take 300 mg by mouth daily.    [provider]  amLODipine (NORVASC) 10 MG tablet Take 10 mg by mouth daily.    [provider]  aspirin EC 81 MG tablet Take 81 mg by mouth daily.    [provider]  atorvastatin (LIPITOR) 20 MG tablet  03/21/18   [provider]  citalopram (CELEXA) 40 MG tablet Take 40 mg by mouth daily.    [provider]  furosemide (LASIX) 20 MG tablet Take 1 tablet (20 mg total) by mouth daily. 03/24/16   Theora Gianotti, NP  HYDROcodone-acetaminophen (NORCO/VICODIN) 5-325 MG tablet Take 1 tablet by mouth every 8 (eight) hours as needed for moderate pain. 05/20/18   Carole Civil, MD  IBU 800 MG tablet TAKE 1 TABLET EVERY 8 HOURS AS NEEDED 11/20/19   Carole Civil, MD  irbesartan (AVAPRO) 300 MG tablet Take 300 mg by mouth daily.     [provider]  labetalol (NORMODYNE) 300 MG tablet Take 300 mg by mouth 3 (three) times daily.    [provider]  omeprazole (PRILOSEC) 40 MG capsule Take 40 mg by mouth daily.    [provider]  traZODone (DESYREL) 150 MG tablet  01/22/18   [provider]    Allergies    Shrimp [shellfish allergy]  Review of Systems   Review of Systems  Constitutional: Negative for chills and fever.  HENT: Negative for congestion and facial swelling.   Eyes: Negative for discharge and visual disturbance.  Respiratory: Negative for shortness of breath.   Cardiovascular: Positive for chest pain. Negative for palpitations.  Gastrointestinal: Negative for abdominal pain, diarrhea and vomiting.  Musculoskeletal: Negative for arthralgias and myalgias.  Skin: Negative for color change and rash.  Neurological: Negative for tremors, syncope and headaches.  Psychiatric/Behavioral: Negative for confusion and dysphoric mood.    Physical Exam Updated Vital  Signs BP (!) 171/87   Pulse 64   Temp 98.2 F (36.8 C) (Oral)   Resp 18   Ht 5' 8.5" (1.74 m)   Wt 108.9 kg   SpO2 100%   BMI 35.96 kg/m   Physical Exam Vitals and nursing note reviewed.  Constitutional:      Appearance: He is well-developed.  HENT:     Head: Normocephalic and atraumatic.  Eyes:     Pupils: Pupils are equal, round, and reactive to light.  Neck:     Vascular: No JVD.  Cardiovascular:     Rate and Rhythm: Normal rate and regular rhythm.     Heart sounds: No murmur. No friction rub. No gallop.   Pulmonary:     Effort: No respiratory distress.     Breath sounds: No wheezing.  Chest:  Chest wall: Tenderness present.     Comments: Tenderness with palpation of the left anterior chest wall worse about the lateral clavicular line about ribs 3 through 5.  Reproduces the patient's pain. Abdominal:     General: There is no distension.     Tenderness: There is no guarding or rebound.  Musculoskeletal:        General: Normal range of motion.     Cervical back: Normal range of motion and neck supple.  Skin:    Coloration: Skin is not pale.     Findings: No rash.  Neurological:     Mental Status: He is alert and oriented to person, place, and time.  Psychiatric:        Behavior: Behavior normal.     ED Results / Procedures / Treatments   Labs (all labs ordered are listed, but only abnormal results are displayed) Labs Reviewed  BASIC METABOLIC PANEL - Abnormal; Notable for the following components:      Result Value   Potassium 3.1 (*)    Glucose, Bld 106 (*)    Calcium 8.8 (*)    GFR calc non Af Amer 60 (*)    All other components within normal limits  CBC - Abnormal; Notable for the following components:   RBC 3.57 (*)    Hemoglobin 12.0 (*)    HCT 35.7 (*)    All other components within normal limits  TROPONIN I (HIGH SENSITIVITY)  TROPONIN I (HIGH SENSITIVITY)    EKG EKG Interpretation  Date/Time:  Saturday December 23 2019 04:18:25  EDT Ventricular Rate:  66 PR Interval:    QRS Duration: 85 QT Interval:  416 QTC Calculation: 436 R Axis:   -2 Text Interpretation: Sinus rhythm Nonspecific T abnormalities, inferior leads biphasic t waves in v4-6 not seen on prior Otherwise no significant change Confirmed by Deno Etienne [phone]) on 12/23/2019 4:58:54 AM   Radiology DG Chest 2 View  Result Date: 12/23/2019 CLINICAL DATA:  Chest pain since 10 p.m. EXAM: CHEST - 2 VIEW COMPARISON:  02/23/2018 FINDINGS: The heart size and mediastinal contours are within normal limits. Both lungs are clear. The visualized skeletal structures are unremarkable. IMPRESSION: No active cardiopulmonary disease. Electronically Signed   By: Kathreen Devoid   On: 12/23/2019 06:43    Procedures Procedures (including critical care time)  Medications Ordered in ED Medications - No data to display  ED Course  I have reviewed the triage vital signs and the nursing notes.  Pertinent labs & imaging results that were available during my care of the patient were reviewed by me and considered in my medical decision making (see chart for details).    MDM Rules/Calculators/A&P                      79 yo M with left-sided chest pain that occurred the day after he lifted some heavy boxes.  Reproducible on exam.  Most likely muscular.  Will obtain a single troponin EKG chest x-ray.  Chest x-ray reviewed by me without focal infiltrate or pneumothorax.  Delta troponin is negative.  Discharge home.  7:12 AM:  I have discussed the diagnosis/risks/treatment options with the patient and believe the pt to be eligible for discharge home to follow-up with PCP. We also discussed returning to the ED immediately if new or worsening sx occur. We discussed the sx which are most concerning (e.g., sudden worsening pain, fever, inability to tolerate by mouth) that necessitate immediate return. Medications  administered to the patient during their visit and any new prescriptions  provided to the patient are listed below.  Medications given during this visit Medications - No data to display   The patient appears reasonably screen and/or stabilized for discharge and I doubt any other medical condition or other [facility] requiring further screening, evaluation, or treatment in the ED at this time prior to discharge.   Final Clinical Impression(s) / ED Diagnoses Final diagnoses:  Atypical chest pain    Rx / DC Orders ED Discharge Orders    None       Deno Etienne, DO 12/23/19 [id number]

## 2019-12-23 NOTE — ED Notes (Signed)
Pt in CT at this time.

## 2019-12-23 NOTE — Discharge Instructions (Signed)
Take Tylenol for your pain.  Please follow-up with your doctor in the office.  Return for worsening pain or pain on exertion.

## 2020-01-01 ENCOUNTER — Ambulatory Visit: Payer: Medicare HMO | Admitting: Internal Medicine

## 2020-01-01 ENCOUNTER — Encounter: Payer: Self-pay | Admitting: Internal Medicine

## 2020-01-01 ENCOUNTER — Other Ambulatory Visit: Payer: Self-pay

## 2020-01-01 VITALS — BP 154/90 | HR 70 | Temp 97.2°F | Ht 68.5 in | Wt 255.0 lb

## 2020-01-01 DIAGNOSIS — R0789 Other chest pain: Secondary | ICD-10-CM

## 2020-01-01 DIAGNOSIS — I10 Essential (primary) hypertension: Secondary | ICD-10-CM

## 2020-01-01 DIAGNOSIS — R5383 Other fatigue: Secondary | ICD-10-CM | POA: Diagnosis not present

## 2020-01-01 DIAGNOSIS — R0602 Shortness of breath: Secondary | ICD-10-CM | POA: Diagnosis not present

## 2020-01-01 NOTE — Progress Notes (Signed)
OFFICE NOTE  Chief Complaint:   Follow-up echo  Primary Care Physician: Lujean Amel, MD  HPI:  John Hanson is a pleasant 79 year old male is coming referred to me for preoperative clearance prior to back surgery. He said planning on undergoing spine surgery by Dr. Susa Day. He's recently been having some shortness of breath with exertion and underwent preoperative evaluation which demonstrated mildly abnormal EKG. I repeated the EKG in the office today which shows nonspecific lateral and inferior ST and T-wave changes. It is a normal sinus rhythm. He denies any chest pain but reports some mild shortness of breath. Recently was seen by his primary care provider who noted a systolic murmur. An echocardiogram was suggested that I do not see that it has been performed. He has no known cardiac history. There is no significant coronary disease in the family. He does have cardiac risk factors including hypertension, but no significant dyslipidemia or diabetes.  05/21/2017  John Hanson returns today for follow-up. This is a routine appointment. He last saw Almyra Deforest, [place], in March 2018 for follow-up of essential hypertension and dyspnea. Blood pressure is well controlled and he felt that his dyspnea was due to some deconditioning. No chest pain was noted. He recommended exercise. John Hanson since then has had some progressive dyspnea. His last echo was in 2016 which showed low normal LVEF 50-55% and mild aortic stenosis. He does have some peripheral vascular disease and is undergoing a venous insufficiency study with follow-up seeing Dr. Donnetta Hutching.  06/02/2017  John Hanson returns today for follow-up. He denies any worsening shortness of breath. We repeated his echo which shows systolic function and very mildly elevated aortic gradient. Is less than 10 mmHg and represents probably very mild if any aortic stenosis. He says is not significantly more short of breath and denies any  chest pain. Overall he is doing pretty well.  01/01/2020  John Hanson is seen today in follow-up.  Have not seen him since 2018.  Recently he presented to [facility] emergency room with left-sided chest pain.  Was worse after lifting.  According to the ER physician he had palpable/reproducible pain and was felt to have a musculoskeletal strain.  He was encouraged to take Tylenol.  After taking a Tylenol for a few days he said those symptoms improved.  Unrelated to that I think however he has had some progressive dyspnea on exertion, fatigue and other symptoms.  He has had bilateral hip replacements and has significant osteoarthritis.  He walks with a cane.  He does have a history of possibly very mild aortic stenosis by echo.  This was seen in 2016 and a repeat echo in 2018 showed no significant change.  PMHx:  Past Medical History:  Diagnosis Date  . Anxiety   . Aortic stenosis    a. 06/2015 Echo: EF 50-55%, mild diff HK, Gr1 DD, mild AS w/ restricted mobility of R coronary and non-coronary cusp, mildly dil Ao root (46mm), mildly dil LA, nl RV fxn, mild TR/PR, PASP 9mmHg.  . Arthritis   . Depression   . Essential hypertension   . GERD (gastroesophageal reflux disease)   . Numbness and tingling    fingertips bilat   . Osteoarthritis    a. R>L hip pending R THA.  . Sleep apnea    a. pt states does not use CPAP machine   . Varicose veins     Past Surgical History:  Procedure Laterality Date  . ENDOVENOUS ABLATION  SAPHENOUS VEIN W/ LASER Right 05-31-2014   EVLA  right gretaer saphenous vein by Curt Jews MD  . LUMBAR LAMINECTOMY/DECOMPRESSION MICRODISCECTOMY N/A 09/04/2015   Procedure: MICRO LUMBAR DECOMPRESSION L4 - L5,  L3 - L4 2 LEVELS;  Surgeon: Susa Day, MD;  Location: [facility];  Service: Orthopedics;  Laterality: N/A;  . QUADRICEPS TENDON REPAIR Left 02/24/2018   Procedure: LEFT QUADRICEPS TENDON REPAIR;  Surgeon: Carole Civil, MD;  Location: [facility];  Service:  Orthopedics;  Laterality: Left;  . TOTAL HIP ARTHROPLASTY Right 03/26/2016   Procedure: RIGHT TOTAL HIP ARTHROPLASTY ANTERIOR APPROACH;  Surgeon: Rod Can, MD;  Location: [facility];  Service: Orthopedics;  Laterality: Right;  . TOTAL HIP ARTHROPLASTY Left 07/30/2016   Procedure: TOTAL HIP ARTHROPLASTY ANTERIOR APPROACH;  Surgeon: Rod Can, MD;  Location: [facility];  Service: Orthopedics;  Laterality: Left;    FAMHx:  Family History  Problem Relation Age of Onset  . Kidney disease Mother     SOCHx:   reports that he quit smoking about 51 years ago. His smoking use included cigars. He quit after 5.00 years of use. He has never used smokeless tobacco. He reports that he does not drink alcohol or use drugs.  ALLERGIES:  Allergies  Allergen Reactions  . Shrimp [Shellfish Allergy] Other (See Comments)    Flares up gout    ROS: Pertinent items noted in HPI and remainder of comprehensive ROS otherwise negative.  HOME MEDS: Current Outpatient Medications  Medication Sig Dispense Refill  . allopurinol (ZYLOPRIM) 300 MG tablet Take 300 mg by mouth daily.    Marland Kitchen amLODipine (NORVASC) 10 MG tablet Take 10 mg by mouth daily.    Marland Kitchen aspirin EC 81 MG tablet Take 81 mg by mouth daily.    Marland Kitchen atorvastatin (LIPITOR) 20 MG tablet     . citalopram (CELEXA) 40 MG tablet Take 40 mg by mouth daily.    . furosemide (LASIX) 20 MG tablet Take 1 tablet (20 mg total) by mouth daily. 30 tablet 6  . HYDROcodone-acetaminophen (NORCO/VICODIN) 5-325 MG tablet Take 1 tablet by mouth every 8 (eight) hours as needed for moderate pain. 21 tablet 0  . IBU 800 MG tablet TAKE 1 TABLET EVERY 8 HOURS AS NEEDED 270 tablet 0  . irbesartan (AVAPRO) 300 MG tablet Take 300 mg by mouth daily.     Marland Kitchen labetalol (NORMODYNE) 300 MG tablet Take 300 mg by mouth 3 (three) times daily.    Marland Kitchen omeprazole (PRILOSEC) 40 MG capsule Take 40 mg by mouth daily.    . traZODone (DESYREL) 150 MG tablet      No current facility-administered  medications for this visit.    LABS/IMAGING: No results found for this or any previous visit (from the past 48 hour(s)). No results found.  WEIGHTS: Wt Readings from Last 3 Encounters:  01/01/20 255 lb (115.7 kg)  12/23/19 240 lb (108.9 kg)  06/20/19 250 lb (113.4 kg)    VITALS: BP (!) 154/90 (BP Location: Left Arm, Patient Position: Sitting, Cuff Size: Large)   Pulse 70   Temp (!) 97.2 F (36.2 C)   Ht 5' 8.5" (1.74 m)   Wt 255 lb (115.7 kg)   BMI 38.21 kg/m   EXAM: General appearance: alert and no distress Neck: no carotid bruit, no JVD and thyroid not enlarged, symmetric, no tenderness/mass/nodules Lungs: clear to auscultation bilaterally Heart: regular rate and rhythm, S1, S2 normal and systolic murmur: systolic ejection 2/6, crescendo at 2nd left intercostal space Abdomen:  soft, non-tender; bowel sounds normal; no masses,  no organomegaly Extremities: extremities normal, atraumatic, no cyanosis or edema Pulses: 2+ and symmetric Skin: Skin color, texture, turgor normal. No rashes or lesions Neurologic: Grossly normal Psych: Pleasant  EKG: Normal sinus rhythm at 70, nonspecific T wave changes-personally reviewed  ASSESSMENT: 1. Progressive DOE - stable echo 2. Murmur -very mild aortic stenosis 3. Abnormal EKG with nonspecific ST and T changes 4. Venous insufficiency  PLAN: 1.   John Hanson recently presented to [facility], ER with chest pain however it was reproducible likely musculoskeletal.  It did improve with Tylenol.  Unrelated to that I think however he has had some progressive dyspnea on exertion and decreased exercise tolerance.  He had very mild aortic stenosis by echo in 2018 however today he is exam is also consistent with that with a soft systolic murmur.  Therefore I do not feel that aortic stenosis is likely the cause of his shortness of breath.  EF was low normal in the past, however normalized to 55 to 60% in 2018.  I do not see history of coronary  perfusion evaluation in the past.  He does have some nonspecific T wave changes on his EKG.  Recently he did rule out for MI.  Would like for him to undergo a Lexiscan Myoview to rule out any ischemia.  If his EF is reduced on the Myoview study then will likely proceed with an echocardiogram.  Follow-up with me in 6 months.  Pixie Casino, MD, [facility], [place] Director of the Advanced Lipid Disorders &  Cardiovascular Risk Reduction Clinic Diplomate of the American Board of Clinical Lipidology Attending Cardiologist  Direct Dial: [phone]  Fax: [phone]  Website:  [email or web address] 01/01/2020, 9:10 AM

## 2020-01-01 NOTE — Patient Instructions (Signed)
Medication Instructions:  Your physician recommends that you continue on your current medications as directed. Please refer to the Current Medication list given to you today.  *If you need a refill on your cardiac medications before your next appointment, please call your pharmacy*   Testing/Procedures: Lexiscan Myoview Stress Test - [id number] Floor   Follow-Up: At [facility], you and your health needs are our priority.  As part of our continuing mission to provide you with exceptional heart care, we have created designated Provider Care Teams.  These Care Teams include your primary Cardiologist (physician) and Advanced Practice Providers (APPs -  Physician Assistants and Nurse Practitioners) who all work together to provide you with the care you need, when you need it.  We recommend signing up for the patient portal called "MyChart".  Sign up information is provided on this After Visit Summary.  MyChart is used to connect with patients for Virtual Visits (Telemedicine).  Patients are able to view lab/test results, encounter notes, upcoming appointments, etc.  Non-urgent messages can be sent to your provider as well.   To learn more about what you can do with MyChart, go to [email or web address].    Your next appointment:   6 month(s)  The format for your next appointment:   In Person  Provider:   You may see Pixie Casino, MD or one of the following Advanced Practice Providers on your designated Care Team:    Almyra Deforest, PA-C  Fabian Sharp, PA-C or   Roby Lofts, [place]    Other Instructions

## 2020-01-02 ENCOUNTER — Telehealth (HOSPITAL_COMMUNITY): Payer: Self-pay

## 2020-01-02 NOTE — Telephone Encounter (Signed)
Spoke with the patient, detailed instructions were given. He stated that he would be here for his test and understood. Asked to call back with any questions. John Hanson EMTP

## 2020-01-03 DIAGNOSIS — M79672 Pain in left foot: Secondary | ICD-10-CM | POA: Diagnosis not present

## 2020-01-03 DIAGNOSIS — I10 Essential (primary) hypertension: Secondary | ICD-10-CM | POA: Diagnosis not present

## 2020-01-03 DIAGNOSIS — M109 Gout, unspecified: Secondary | ICD-10-CM | POA: Diagnosis not present

## 2020-01-03 DIAGNOSIS — Z79899 Other long term (current) drug therapy: Secondary | ICD-10-CM | POA: Diagnosis not present

## 2020-01-04 ENCOUNTER — Ambulatory Visit (HOSPITAL_COMMUNITY): Payer: Medicare HMO | Attending: Cardiovascular Disease

## 2020-01-04 ENCOUNTER — Other Ambulatory Visit: Payer: Self-pay

## 2020-01-04 DIAGNOSIS — R5383 Other fatigue: Secondary | ICD-10-CM | POA: Diagnosis not present

## 2020-01-04 DIAGNOSIS — I10 Essential (primary) hypertension: Secondary | ICD-10-CM

## 2020-01-04 DIAGNOSIS — R0602 Shortness of breath: Secondary | ICD-10-CM | POA: Insufficient documentation

## 2020-01-04 DIAGNOSIS — R0789 Other chest pain: Secondary | ICD-10-CM | POA: Diagnosis not present

## 2020-01-04 LAB — MYOCARDIAL PERFUSION IMAGING
LV dias vol: 104 mL (ref 62–150)
LV sys vol: 41 mL
Peak HR: 75 {beats}/min
Rest HR: 62 {beats}/min
SDS: 1
SRS: 0
SSS: 1
TID: 0.96

## 2020-01-04 MED ORDER — TECHNETIUM TC 99M TETROFOSMIN IV KIT
10.2000 | PACK | Freq: Once | INTRAVENOUS | Status: AC | PRN
  Administered 2020-01-04: 10.2 via INTRAVENOUS
  Filled 2020-01-04: qty 11

## 2020-01-04 MED ORDER — TECHNETIUM TC 99M TETROFOSMIN IV KIT
30.9000 | PACK | Freq: Once | INTRAVENOUS | Status: AC | PRN
  Administered 2020-01-04: 30.9 via INTRAVENOUS
  Filled 2020-01-04: qty 31

## 2020-01-04 MED ORDER — REGADENOSON 0.4 MG/5ML IV SOLN
0.4000 mg | Freq: Once | INTRAVENOUS | Status: AC
  Administered 2020-01-04: 0.4 mg via INTRAVENOUS

## 2020-01-17 DIAGNOSIS — Z79899 Other long term (current) drug therapy: Secondary | ICD-10-CM | POA: Diagnosis not present

## 2020-01-25 ENCOUNTER — Other Ambulatory Visit: Payer: Self-pay | Admitting: Podiatry

## 2020-01-25 ENCOUNTER — Encounter: Payer: Self-pay | Admitting: Podiatry

## 2020-01-25 ENCOUNTER — Ambulatory Visit (INDEPENDENT_AMBULATORY_CARE_PROVIDER_SITE_OTHER): Payer: Medicare HMO

## 2020-01-25 ENCOUNTER — Ambulatory Visit: Payer: Medicare HMO | Admitting: Podiatry

## 2020-01-25 ENCOUNTER — Other Ambulatory Visit: Payer: Self-pay

## 2020-01-25 VITALS — BP 129/76 | HR 97 | Temp 97.6°F | Resp 16

## 2020-01-25 DIAGNOSIS — M779 Enthesopathy, unspecified: Secondary | ICD-10-CM | POA: Diagnosis not present

## 2020-01-25 DIAGNOSIS — M2011 Hallux valgus (acquired), right foot: Secondary | ICD-10-CM | POA: Diagnosis not present

## 2020-01-25 DIAGNOSIS — L84 Corns and callosities: Secondary | ICD-10-CM

## 2020-01-25 DIAGNOSIS — M778 Other enthesopathies, not elsewhere classified: Secondary | ICD-10-CM

## 2020-01-25 DIAGNOSIS — M79672 Pain in left foot: Secondary | ICD-10-CM

## 2020-01-25 NOTE — Progress Notes (Signed)
Subjective:   Patient ID: John Hanson, male   DOB: 79 y.o.   MRN: [id number]   HPI Patient presents with a lot of pain in the big toe joint right and also left has severe keratotic lesion subsecond metatarsal and fifth metatarsal that are painful when pressed.  Patient states his big toe joint right has been ongoing and gradually more of an issue and its been going on for at least 6 months but upwards of a year.  Patient does not smoke does like to be active   Review of Systems  All other systems reviewed and are negative.       Objective:  Physical Exam Vitals and nursing note reviewed.  Constitutional:      Appearance: He is well-developed.  Pulmonary:     Effort: Pulmonary effort is normal.  Musculoskeletal:        General: Normal range of motion.  Skin:    General: Skin is warm.  Neurological:     Mental Status: He is alert.     Neurovascular status was found to be moderately diminished but intact with diminished sharp dull vibratory.  Patient is found to have inflammation pain of the first MPJ right with reduced range of motion significant with mild reduction motion left first metatarsal with severe's keratotic lesion subsecond and fifth metatarsal left that are very thickened and painful with pressure.  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  For the left I debrided lesions x2 with no iatrogenic bleeding and this can be done and may require other treatments depending on response to conservative treatment  X-rays indicate that there is severe arthritis first MPJ right over left with spur formation noted and osteoarthritis with no stress fracture  noted

## 2020-01-25 NOTE — Progress Notes (Signed)
   Subjective:    Patient ID: John Hanson, male    DOB: [birth year], 79 y.o.   MRN: [id number]  HPI    Review of Systems  All other systems reviewed and are negative.      Objective:   Physical Exam        Assessment & Plan:

## 2020-04-08 DIAGNOSIS — H401112 Primary open-angle glaucoma, right eye, moderate stage: Secondary | ICD-10-CM | POA: Diagnosis not present

## 2020-05-21 DIAGNOSIS — K219 Gastro-esophageal reflux disease without esophagitis: Secondary | ICD-10-CM | POA: Diagnosis not present

## 2020-05-21 DIAGNOSIS — Z79899 Other long term (current) drug therapy: Secondary | ICD-10-CM | POA: Diagnosis not present

## 2020-05-21 DIAGNOSIS — I77811 Abdominal aortic ectasia: Secondary | ICD-10-CM | POA: Diagnosis not present

## 2020-05-21 DIAGNOSIS — Z0001 Encounter for general adult medical examination with abnormal findings: Secondary | ICD-10-CM | POA: Diagnosis not present

## 2020-05-21 DIAGNOSIS — I10 Essential (primary) hypertension: Secondary | ICD-10-CM | POA: Diagnosis not present

## 2020-05-21 DIAGNOSIS — R7309 Other abnormal glucose: Secondary | ICD-10-CM | POA: Diagnosis not present

## 2020-05-21 DIAGNOSIS — E7800 Pure hypercholesterolemia, unspecified: Secondary | ICD-10-CM | POA: Diagnosis not present

## 2020-05-21 DIAGNOSIS — M109 Gout, unspecified: Secondary | ICD-10-CM | POA: Diagnosis not present

## 2020-05-21 DIAGNOSIS — M1990 Unspecified osteoarthritis, unspecified site: Secondary | ICD-10-CM | POA: Diagnosis not present

## 2020-05-24 ENCOUNTER — Other Ambulatory Visit: Payer: Self-pay | Admitting: Family Medicine

## 2020-05-24 DIAGNOSIS — I77811 Abdominal aortic ectasia: Secondary | ICD-10-CM

## 2020-05-24 DIAGNOSIS — R0989 Other specified symptoms and signs involving the circulatory and respiratory systems: Secondary | ICD-10-CM

## 2020-05-29 ENCOUNTER — Ambulatory Visit
Admission: RE | Admit: 2020-05-29 | Discharge: 2020-05-29 | Disposition: A | Discharge status: Home / Self Care | Payer: Medicare HMO | Source: Ambulatory Visit | Attending: Family Medicine | Admitting: Family Medicine

## 2020-05-29 DIAGNOSIS — I70211 Atherosclerosis of native arteries of extremities with intermittent claudication, right leg: Secondary | ICD-10-CM | POA: Diagnosis not present

## 2020-05-29 DIAGNOSIS — I77811 Abdominal aortic ectasia: Secondary | ICD-10-CM

## 2020-05-29 DIAGNOSIS — R0989 Other specified symptoms and signs involving the circulatory and respiratory systems: Secondary | ICD-10-CM

## 2020-06-11 DIAGNOSIS — Z23 Encounter for immunization: Secondary | ICD-10-CM | POA: Diagnosis not present

## 2020-06-15 ENCOUNTER — Other Ambulatory Visit: Payer: Self-pay | Admitting: Orthopedic Surgery

## 2020-06-17 NOTE — Telephone Encounter (Signed)
Rx request 

## 2020-07-21 NOTE — Progress Notes (Signed)
Virtual Visit via Telephone Note   This visit type was conducted due to national recommendations for restrictions regarding the COVID-19 Pandemic (e.g. social distancing) in an effort to limit this patient's exposure and mitigate transmission in our community.  Due to his co-morbid illnesses, this patient is at least at moderate risk for complications without adequate follow up.  This format is felt to be most appropriate for this patient at this time.  The patient did not have access to video technology/had technical difficulties with video requiring transitioning to audio format only (telephone).  All issues noted in this document were discussed and addressed.  No physical exam could be performed with this format.  Please refer to the patient's chart for his  consent to telehealth for [facility].    Date:  07/22/2020   ID:  John Hanson, DOB [birth year], MRN [id number] The patient was identified using 2 identifiers.  Patient Location: Home Provider Location: Office/Clinic  PCP:  Lujean Amel, MD  Cardiologist:  Pixie Casino, MD  Electrophysiologist:  None   Evaluation Performed:  Follow-Up Visit  Chief Complaint:  Follow up  History of Present Illness:    John Hanson is a 79 y.o. male with HTN, ascending aorta dilation, AS, and obesity. He established care with Dr. Debara Pickett for DOE and preop clearance in 2016. Echo with normal EF, mild AS, grade 1 DD. He follows with Dr. Donnetta Hutching for prior right great saphenous vein ablation on 05/31/14. No history of PVD. He was last seen by Dr. Debara Pickett 01/01/20 following an ER visit to [facility] for left sided chest pain. CP felt to be MSK in the ER and he was discharged without admission. Symptoms resolved with tylenol. Due to some DOE, Dr. Debara Pickett obtained nuclear stress test that was low risk, no ischemia.   He presents today for routine scheduled follow up. He has had two hip replacements and a back surgery and did well throughout the  perioperative period. He denies chest pain, shortness of breath, palpitations, and syncope. He does have SOB with walking - suspect deconditioning and hip pain. He has been told he has extensive arthritis. Voltaren gell has not been helpful. Suggested lidocaine patches. BP 131/85 at last doctor visit.    The patient does not have symptoms concerning for COVID-19 infection (fever, chills, cough, or new shortness of breath).    Past Medical History:  Diagnosis Date  . Anxiety   . Aortic stenosis    a. 06/2015 Echo: EF 50-55%, mild diff HK, Gr1 DD, mild AS w/ restricted mobility of R coronary and non-coronary cusp, mildly dil Ao root (1mm), mildly dil LA, nl RV fxn, mild TR/PR, PASP 6mmHg.  . Arthritis   . Depression   . Essential hypertension   . GERD (gastroesophageal reflux disease)   . Numbness and tingling    fingertips bilat   . Osteoarthritis    a. R>L hip pending R THA.  . Sleep apnea    a. pt states does not use CPAP machine   . Varicose veins    Past Surgical History:  Procedure Laterality Date  . ENDOVENOUS ABLATION SAPHENOUS VEIN W/ LASER Right 05-31-2014   EVLA  right gretaer saphenous vein by Curt Jews MD  . LUMBAR LAMINECTOMY/DECOMPRESSION MICRODISCECTOMY N/A 09/04/2015   Procedure: MICRO LUMBAR DECOMPRESSION L4 - L5,  L3 - L4 2 LEVELS;  Surgeon: Susa Day, MD;  Location: [facility];  Service: Orthopedics;  Laterality: N/A;  . QUADRICEPS TENDON REPAIR Left  02/24/2018   Procedure: LEFT QUADRICEPS TENDON REPAIR;  Surgeon: Carole Civil, MD;  Location: [facility];  Service: Orthopedics;  Laterality: Left;  . TOTAL HIP ARTHROPLASTY Right 03/26/2016   Procedure: RIGHT TOTAL HIP ARTHROPLASTY ANTERIOR APPROACH;  Surgeon: Rod Can, MD;  Location: [facility];  Service: Orthopedics;  Laterality: Right;  . TOTAL HIP ARTHROPLASTY Left 07/30/2016   Procedure: TOTAL HIP ARTHROPLASTY ANTERIOR APPROACH;  Surgeon: Rod Can, MD;  [facility];  Service: Orthopedics;   Laterality: Left;     Current Meds  Medication Sig  . allopurinol (ZYLOPRIM) 300 MG tablet   . amLODipine (NORVASC) 10 MG tablet Take 10 mg by mouth daily.  Marland Kitchen aspirin EC 81 MG tablet Take 81 mg by mouth daily.  Marland Kitchen atorvastatin (LIPITOR) 20 MG tablet   . brimonidine (ALPHAGAN) 0.2 % ophthalmic solution 1 drop 2 (two) times daily.  . chlorthalidone (HYGROTON) 25 MG tablet   . citalopram (CELEXA) 40 MG tablet Take 40 mg by mouth daily.  Marland Kitchen ibuprofen (IBU) 800 MG tablet Take 1 tablet (800 mg total) by mouth every 8 (eight) hours as needed.  . irbesartan (AVAPRO) 300 MG tablet Take 300 mg by mouth daily.   Marland Kitchen labetalol (NORMODYNE) 300 MG tablet Take 300 mg by mouth 3 (three) times daily.  Marland Kitchen latanoprost (XALATAN) 0.005 % ophthalmic solution 1 drop at bedtime.  . naproxen (NAPROSYN) 500 MG tablet   . omeprazole (PRILOSEC) 40 MG capsule Take 40 mg by mouth daily.  . potassium chloride SA (KLOR-CON) 20 MEQ tablet   . traZODone (DESYREL) 100 MG tablet   . [DISCONTINUED] furosemide (LASIX) 20 MG tablet Take 1 tablet (20 mg total) by mouth daily.     Allergies:   Shrimp [shellfish allergy]   Social History   Tobacco Use  . Smoking status: Former Smoker    Years: 5.00    Types: Cigars    Quit date: 09/28/1968    Years since quitting: 51.8  . Smokeless tobacco: Never Used  Vaping Use  . Vaping Use: Never used  Substance Use Topics  . Alcohol use: No  . Drug use: No     Family Hx: The patient's family history includes Kidney disease in his mother.  ROS:   Please see the history of present illness.     All other systems reviewed and are negative.   Prior CV studies:   The following studies were reviewed today:  Myoview 01/04/20:  The left ventricular ejection fraction is normal (55-65%).  Nuclear stress EF: 61%.  There was no ST segment deviation noted during stress.  No T wave inversion was noted during stress.  The study is normal.  This is a low risk study.   Low risk  stress nuclear study with normal perfusion and normal left ventricular regional and global systolic function   Echo 10/4266: Study Conclusions   - Left ventricle: The cavity size was normal. Wall thickness was  increased in a pattern of moderate LVH. Systolic function was  normal. The estimated ejection fraction was in the range of 55%  to 60%. Wall motion was normal; there were no regional wall  motion abnormalities. Doppler parameters are consistent with  abnormal left ventricular relaxation (grade 1 diastolic  dysfunction).  - Aorta: Ascending aortic diameter: 37 mm (S).  - Aortic root: The aortic root was normal in size.  - Left atrium: The atrium was mildly dilated.   Labs/Other Tests and Data Reviewed:    EKG:  No  ECG reviewed.  Recent Labs: 12/23/2019: BUN 20; Creatinine, Ser 1.17; Hemoglobin 12.0; Platelets 170; Potassium 3.1; Sodium 140   Recent Lipid Panel No results found for: CHOL, TRIG, HDL, CHOLHDL, LDLCALC, LDLDIRECT  Wt Readings from Last 3 Encounters:  01/04/20 255 lb (115.7 kg)  01/01/20 255 lb (115.7 kg)  12/23/19 240 lb (108.9 kg)     Risk Assessment/Calculations:      Objective:    Vital Signs:  There were no vitals taken for this visit.   VITAL SIGNS:  reviewed GEN:  no acute distress RESPIRATORY:  respirations unlabored NEURO:  alert and oriented x 3, no obvious focal deficit PSYCH:  normal affect  ASSESSMENT & PLAN:    Hypertension - amlodipine, irbesartan, labetalol, chlorthalidone - pressure controlled at last doctor visit - PCP added 20 mEq potassium - will let Dr. Dorthy Cooler follow this - he is not taking lasix - will take this off med list - given his ascending aorta dilation, would prefer to keep pressure closer to 120/80 - we discussed low sodium diet and taking his BP 2-3 times per week 2 hrs after morning medication   Ascending aorta dilation - echo 2018 37 mm ascending aorta - US 05/29/20 showed 2.8 cm abdominal  aorta - repeat in 5 years   Mild AS - last echo 2018 - denies new symptoms, will defer repeat echo for now    COVID-19 Education: The signs and symptoms of COVID-19 were discussed with the patient and how to seek care for testing (follow up with PCP or arrange E-visit).  The importance of social distancing was discussed today.  Time:   Today, I have spent 32 minutes with the patient with telehealth technology discussing the above problems.     Medication Adjustments/Labs and Tests Ordered: Current medicines are reviewed at length with the patient today.  Concerns regarding medicines are outlined above.   Tests Ordered: No orders of the defined types were placed in this encounter.   Medication Changes: No orders of the defined types were placed in this encounter.   Follow Up:  In Person in 6 month(s)  Signed, Ledora Bottcher, [place]  07/22/2020 10:34 AM    [place]

## 2020-07-22 ENCOUNTER — Telehealth: Payer: Self-pay

## 2020-07-22 ENCOUNTER — Telehealth (INDEPENDENT_AMBULATORY_CARE_PROVIDER_SITE_OTHER): Payer: Medicare HMO | Admitting: Physician Assistant

## 2020-07-22 ENCOUNTER — Encounter: Payer: Self-pay | Admitting: Physician Assistant

## 2020-07-22 DIAGNOSIS — I10 Essential (primary) hypertension: Secondary | ICD-10-CM | POA: Diagnosis not present

## 2020-07-22 DIAGNOSIS — I77810 Thoracic aortic ectasia: Secondary | ICD-10-CM

## 2020-07-22 DIAGNOSIS — I350 Nonrheumatic aortic (valve) stenosis: Secondary | ICD-10-CM | POA: Diagnosis not present

## 2020-07-22 NOTE — Patient Instructions (Addendum)
Medication Instructions:  No changes *If you need a refill on your cardiac medications before your next appointment, please call your pharmacy*   Lab Work: No Labs If you have labs (blood work) drawn today and your tests are completely normal, you will receive your results only by: Marland Kitchen MyChart Message (if you have MyChart) OR . A paper copy in the mail If you have any lab test that is abnormal or we need to change your treatment, we will call you to review the results.   Testing/Procedures: No Testing   Follow-Up: At [facility], you and your health needs are our priority.  As part of our continuing mission to provide you with exceptional heart care, we have created designated Provider Care Teams.  These Care Teams include your primary Cardiologist (physician) and Advanced Practice Providers (APPs -  Physician Assistants and Nurse Practitioners) who all work together to provide you with the care you need, when you need it.  We recommend signing up for the patient portal called "MyChart".  Sign up information is provided on this After Visit Summary.  MyChart is used to connect with patients for Virtual Visits (Telemedicine).  Patients are able to view lab/test results, encounter notes, upcoming appointments, etc.  Non-urgent messages can be sent to your provider as well.   To learn more about what you can do with MyChart, go to [email or web address].    Your next appointment:   6 month(s)  The format for your next appointment:   In Person  Provider:   K. Mali Hilty, MD   Other Instructions Check Blood Pressure 3 Times weekly (2 hours after Breakfast). If Pressure is over 130/90 call office for an appointment. Low Sodium Diet under 2000 mg Daily.

## 2020-07-22 NOTE — Telephone Encounter (Signed)
[name], dills are scheduled for a virtual visit with your provider today.    Just as we do with appointments in the office, we must obtain your consent to participate.  Your consent will be active for this visit and any virtual visit you may have with one of our providers in the next 365 days.    If you have a MyChart account, I can also send a copy of this consent to you electronically.  All virtual visits are billed to your insurance company just like a traditional visit in the office.  As this is a virtual visit, video technology does not allow for your provider to perform a traditional examination.  This may limit your provider's ability to fully assess your condition.  If your provider identifies any concerns that need to be evaluated in person or the need to arrange testing such as labs, EKG, etc, we will make arrangements to do so.    Although advances in technology are sophisticated, we cannot ensure that it will always work on either your end or our end.  If the connection with a video visit is poor, we may have to switch to a telephone visit.  With either a video or telephone visit, we are not always able to ensure that we have a secure connection.   I need to obtain your verbal consent now.   Are you willing to proceed with your visit today?   John Hanson has provided verbal consent on 07/22/2020 for a virtual visit (video or telephone).   Monia Pouch, [place] 07/22/2020  9:59 AM

## 2020-08-01 DIAGNOSIS — M5137 Other intervertebral disc degeneration, lumbosacral region: Secondary | ICD-10-CM | POA: Diagnosis not present

## 2020-08-01 DIAGNOSIS — M79604 Pain in right leg: Secondary | ICD-10-CM | POA: Diagnosis not present

## 2020-08-01 DIAGNOSIS — M1610 Unilateral primary osteoarthritis, unspecified hip: Secondary | ICD-10-CM | POA: Diagnosis not present

## 2020-08-02 ENCOUNTER — Other Ambulatory Visit: Payer: Self-pay | Admitting: Family Medicine

## 2020-08-02 ENCOUNTER — Ambulatory Visit
Admission: RE | Admit: 2020-08-02 | Discharge: 2020-08-02 | Disposition: A | Discharge status: Home / Self Care | Payer: Medicare HMO | Source: Ambulatory Visit | Attending: Family Medicine | Admitting: Family Medicine

## 2020-08-02 DIAGNOSIS — M79604 Pain in right leg: Secondary | ICD-10-CM

## 2020-08-02 DIAGNOSIS — M1711 Unilateral primary osteoarthritis, right knee: Secondary | ICD-10-CM | POA: Diagnosis not present

## 2020-08-07 ENCOUNTER — Other Ambulatory Visit: Payer: Self-pay | Admitting: Family Medicine

## 2020-08-07 DIAGNOSIS — M79604 Pain in right leg: Secondary | ICD-10-CM

## 2020-08-15 ENCOUNTER — Ambulatory Visit
Admission: RE | Admit: 2020-08-15 | Discharge: 2020-08-15 | Disposition: A | Discharge status: Home / Self Care | Payer: Medicare HMO | Source: Ambulatory Visit | Attending: Family Medicine | Admitting: Family Medicine

## 2020-08-15 DIAGNOSIS — G8929 Other chronic pain: Secondary | ICD-10-CM | POA: Diagnosis not present

## 2020-08-15 DIAGNOSIS — M79604 Pain in right leg: Secondary | ICD-10-CM

## 2020-08-15 DIAGNOSIS — M7989 Other specified soft tissue disorders: Secondary | ICD-10-CM | POA: Diagnosis not present

## 2020-08-19 DIAGNOSIS — H11133 Conjunctival pigmentations, bilateral: Secondary | ICD-10-CM | POA: Diagnosis not present

## 2020-08-19 DIAGNOSIS — H401112 Primary open-angle glaucoma, right eye, moderate stage: Secondary | ICD-10-CM | POA: Diagnosis not present

## 2020-08-19 DIAGNOSIS — H401121 Primary open-angle glaucoma, left eye, mild stage: Secondary | ICD-10-CM | POA: Diagnosis not present

## 2020-08-19 DIAGNOSIS — H18413 Arcus senilis, bilateral: Secondary | ICD-10-CM | POA: Diagnosis not present

## 2020-08-19 DIAGNOSIS — H524 Presbyopia: Secondary | ICD-10-CM | POA: Diagnosis not present

## 2020-08-19 DIAGNOSIS — H11153 Pinguecula, bilateral: Secondary | ICD-10-CM | POA: Diagnosis not present

## 2020-08-19 DIAGNOSIS — H2513 Age-related nuclear cataract, bilateral: Secondary | ICD-10-CM | POA: Diagnosis not present

## 2020-08-20 ENCOUNTER — Ambulatory Visit (HOSPITAL_COMMUNITY): Payer: Medicare HMO | Attending: Family Medicine | Admitting: Physical Therapy

## 2020-08-20 ENCOUNTER — Other Ambulatory Visit: Payer: Self-pay

## 2020-08-20 ENCOUNTER — Encounter (HOSPITAL_COMMUNITY): Payer: Self-pay | Admitting: Physical Therapy

## 2020-08-20 DIAGNOSIS — M545 Low back pain, unspecified: Secondary | ICD-10-CM

## 2020-08-20 DIAGNOSIS — M6281 Muscle weakness (generalized): Secondary | ICD-10-CM | POA: Insufficient documentation

## 2020-08-20 DIAGNOSIS — G8929 Other chronic pain: Secondary | ICD-10-CM | POA: Diagnosis not present

## 2020-08-20 NOTE — Therapy (Signed)
[place], [place], [address] Phone: [phone]   Fax:  [phone]  Physical Therapy Evaluation  Patient Details  Name: John Hanson MRN: [id number] Date of Birth: Dec 13, 1940 Referring Provider (PT): Dibas Koirala   Encounter Date: 08/20/2020   PT End of Session - 08/20/20 1052    Visit Number 1    Number of Visits 12    Date for PT Re-Evaluation 10/07/20    Authorization Type [employer]- authorization put in for 12 visits    Authorization - Visit Number 1    PT Start Time [phone]    PT Stop Time 1045    PT Time Calculation (min) 50 min    Activity Tolerance Patient tolerated treatment well    Behavior During Therapy [facility] for tasks assessed/performed           Past Medical History:  Diagnosis Date  . Anxiety   . Aortic stenosis    a. 06/2015 Echo: EF 50-55%, mild diff HK, Gr1 DD, mild AS w/ restricted mobility of R coronary and non-coronary cusp, mildly dil Ao root (81mm), mildly dil LA, nl RV fxn, mild TR/PR, PASP 29mmHg.  . Arthritis   . Depression   . Essential hypertension   . GERD (gastroesophageal reflux disease)   . Numbness and tingling    fingertips bilat   . Osteoarthritis    a. R>L hip pending R THA.  . Sleep apnea    a. pt states does not use CPAP machine   . Varicose veins     Past Surgical History:  Procedure Laterality Date  . ENDOVENOUS ABLATION SAPHENOUS VEIN W/ LASER Right 05-31-2014   EVLA  right gretaer saphenous vein by Curt Jews MD  . LUMBAR LAMINECTOMY/DECOMPRESSION MICRODISCECTOMY N/A 09/04/2015   Procedure: MICRO LUMBAR DECOMPRESSION L4 - L5,  L3 - L4 2 LEVELS;  Surgeon: Susa Day, MD;  [facility];  Service: Orthopedics;  Laterality: N/A;  . QUADRICEPS TENDON REPAIR Left 02/24/2018   Procedure: LEFT QUADRICEPS TENDON REPAIR;  Surgeon: Carole Civil, MD;  Location: [facility];  Service: Orthopedics;  Laterality: Left;  . TOTAL HIP ARTHROPLASTY Right 03/26/2016    Procedure: RIGHT TOTAL HIP ARTHROPLASTY ANTERIOR APPROACH;  Surgeon: Rod Can, MD;  [facility]: [facility];  Service: Orthopedics;  Laterality: Right;  . TOTAL HIP ARTHROPLASTY Left 07/30/2016   Procedure: TOTAL HIP ARTHROPLASTY ANTERIOR APPROACH;  Surgeon: Rod Can, MD;  [facility];  Service: Orthopedics;  Laterality: Left;    There were no vitals filed for this visit.    Subjective Assessment - 08/20/20 0946    Subjective Mr. Wiler states that everytime he goes to his doctor he sends him to different places to see if can get some relief from his back and this time he sent him to therapy.  He states that he uses a cane to walk with due to OA in almost all of his joints.  He rides a stationary bicycle for about 10 minutes about 2x a week.    Pertinent History OA, B THR, HTN, LT quadricep rupture, spinal stenosis    How long can you sit comfortably? no problem    How long can you stand comfortably? pt able to stand about 30 minutes    How long can you walk comfortably? uses a cane and will walk for about 10 minutes    Patient Stated Goals to try and maintain his strength and be able to move around more, clean  his house.    Currently in Pain? No/denies   Pain free sitting;  worst this week:  8/10             [facility] PT Assessment - 08/20/20 0001      Assessment   Medical Diagnosis Chronic low back pain     Referring Provider (PT) Dibas Koirala    Onset Date/Surgical Date --   years ago    Next MD Visit not scheduled    Prior Therapy years ago      Precautions   Precautions None      Restrictions   Weight Bearing Restrictions No      Balance Screen   Has the patient fallen in the past 6 months No    Has the patient had a decrease in activity level because of a fear of falling?  No    Is the patient reluctant to leave their home because of a fear of falling?  No      Home Ecologist residence      Prior Function   Level of  Independence Independent      Cognition   Overall Cognitive Status Within Functional Limits for tasks assessed      Observation/Other Assessments   Focus on Therapeutic Outcomes (FOTO)  45      Functional Tests   Functional tests Single leg stance;Sit to Stand      Single Leg Stance   Comments Rt:  2"  LT 0       Sit to Stand   Comments 9 in 30 seconds       Posture/Postural Control   Posture/Postural Control Postural limitations    Postural Limitations Rounded Shoulders;Forward head;Decreased lumbar lordosis;Increased thoracic kyphosis;Posterior pelvic tilt      ROM / Strength   AROM / PROM / Strength AROM;Strength      AROM   AROM Assessment Site Lumbar    Lumbar Flexion fingers 12" from floor     Lumbar Extension 10   reps improve sx    Lumbar - Right Side Bend 15    Lumbar - Left Side Bend 20      Strength   Strength Assessment Site Hip;Knee;Ankle    Right/Left Hip Right;Left    Right Hip Flexion 5/5    Right Hip Extension 3/5    Right Hip ABduction 4/5    Left Hip Flexion 5/5    Left Hip Extension 2+/5    Left Hip ABduction 4/5    Right/Left Knee Right;Left    Right Knee Flexion 3+/5    Right Knee Extension 5/5    Left Knee Flexion 5/5    Left Knee Extension 4+/5    Right/Left Ankle Right;Left    Right Ankle Dorsiflexion 5/5    Left Ankle Dorsiflexion 5/5      Flexibility   Soft Tissue Assessment /Muscle Length yes    Hamstrings rt 135: LT 150                      Objective measurements completed on examination: See above findings.       [facility] Adult PT Treatment/Exercise - 08/20/20 0001      Exercises   Exercises Lumbar      Lumbar Exercises: Stretches   Active Hamstring Stretch 3 reps;30 seconds    Single Knee to Chest Stretch 3 reps;30 seconds    Standing Extension 10 reps      Lumbar Exercises:  Seated   Sit to Stand 10 reps                  PT Education - 08/20/20 1051    Education Details HEP    Person(s)  Educated Patient    Methods Explanation;Handout    Comprehension Verbalized understanding;Returned demonstration            PT Short Term Goals - 08/20/20 1101      PT SHORT TERM GOAL #1   Title Pt to be I in his HEP to allow his low back pain to be no greater than a 6/10 while completing housework.    Time 3    Period Weeks    Status New    Target Date 09/13/20   due to holidays pt will not start until next week     PT SHORT TERM GOAL #2   Title PT lumbar ROM to improve 10 degrees in all directions to improve mobility for increased ease of dressing and donning  shoes and socks/    Time 6    Period Weeks    Status New      PT SHORT TERM GOAL #3   Title PT to be able to walk for 15-20 minutes with his cane without increased low back pain to improve ability to shop.    Time 3    Period Weeks    Status New             PT Long Term Goals - 08/20/20 1114      PT LONG TERM GOAL #1   Title PT to be I in advance HEP to allow low back pain to be no greater than a 4/10 following an hour of activity    Time 6    Period Weeks    Status New    Target Date 10/07/20      PT LONG TERM GOAL #2   Title PT hamstrings/ gluteal medius and maximus mm to be increased one grade to allow pt to be able to rise from a low couch without struggling.    Time 6    Period Weeks    Status New      PT LONG TERM GOAL #3   Title Pt to be able to single leg stance on both LE for 10 seconds to reduce pt risk of falling    Time 6    Period Weeks    Status New                  Plan - 08/20/20 1054    Clinical Impression Statement Mr. Mcmurtrey is a 79 yo male who has hx of OA, he has B THR, HTN and chronic low back pain with spinal surgery approximately 4 years ago; he comes to the department referred for LBP.  Evaluation demonstrates increased pain with functional activity, decreased ROM, decreased core and LE strength,(specifically glut max and medius B), decreased balance and postrual  changes.  Mr. Bowens will benefit from skilled PT to address these limitations to improve his functioning level and decrease his pain.    Personal Factors and Comorbidities Comorbidity 3+;Fitness;Time since onset of injury/illness/exacerbation    Comorbidities OA, B THR, Lt quadricep tendon rupture, lumbar surgery, HTN    Examination-Activity Limitations Carry;Dressing;Lift;Locomotion Level;Stand;Stairs;Squat    Examination-Participation Restrictions Cleaning;Yard Work    Stability/Clinical Decision Making Evolving/Moderate complexity    Clinical Decision Making Moderate    Rehab Potential Good    PT Frequency  2x / week    PT Duration 6 weeks    PT Treatment/Interventions Patient/family education;Manual techniques;Therapeutic exercise;Therapeutic activities;Balance training    PT Next Visit Plan Begin hip excursions, ab sets, bridges, dead bugs and sidelying hip abduction.  Give as HEP progress to balance and standing activity.    PT Home Exercise Plan sit to stand, lumbar extension, knee to chest and hamstring stretches.           Patient will benefit from skilled therapeutic intervention in order to improve the following deficits and impairments:  Decreased activity tolerance, Decreased balance, Decreased range of motion, Decreased strength, Difficulty walking, Increased fascial restricitons, Impaired flexibility, Pain  Visit Diagnosis: Chronic bilateral low back pain without sciatica - Plan: PT plan of care cert/re-cert  Muscle weakness (generalized) - Plan: PT plan of care cert/re-cert     Problem List Patient Active Problem List   Diagnosis Date Noted  . Quadriceps tendon rupture, left, subsequent encounter s/p repair 02/24/18   . Swelling of limb 06/02/2017  . DOE (dyspnea on exertion) 05/21/2017  . Ascending aorta dilatation (HCC) 05/21/2017  . Primary osteoarthritis of left hip 07/30/2016  . Primary osteoarthritis of right hip 03/26/2016  . Essential hypertension   .  Aortic valve stenosis   . Osteoarthritis   . Spinal stenosis of lumbar region 09/04/2015  . HTN (hypertension) 07/18/2015  . Murmur 07/18/2015  . Preoperative cardiovascular examination 07/18/2015  . Varicose veins of lower extremities with other complications 62/83/6629  . Pain in limb 02/08/2014   Rayetta Humphrey, PT CLT [phone] 08/20/2020, 11:22 AM  [facility] [address] [place], [place], [address] Phone: [phone]   Fax:  [phone]  Name: BOEN STERBENZ MRN: [id number] Date of Birth: 09-20-1941

## 2020-08-21 ENCOUNTER — Telehealth (HOSPITAL_COMMUNITY): Payer: Self-pay | Admitting: Physical Therapy

## 2020-08-21 NOTE — Telephone Encounter (Signed)
pt came into the office to cancel all the rest of his appts due to he cannot affored the co-pay. please discharge

## 2020-08-26 ENCOUNTER — Ambulatory Visit (HOSPITAL_COMMUNITY): Payer: Medicare HMO

## 2020-08-28 ENCOUNTER — Encounter (HOSPITAL_COMMUNITY): Payer: Medicare HMO | Admitting: Physical Therapy

## 2020-09-02 ENCOUNTER — Encounter (HOSPITAL_COMMUNITY): Payer: Medicare HMO

## 2020-09-05 ENCOUNTER — Telehealth: Payer: Self-pay | Admitting: Orthopedic Surgery

## 2020-09-05 ENCOUNTER — Encounter (HOSPITAL_COMMUNITY): Payer: Medicare HMO | Admitting: Physical Therapy

## 2020-09-05 NOTE — Telephone Encounter (Signed)
[employer] called he is using Ibuprofen 800 and has now gotten rx for Voltaren gel, they dont want him to use both  Left message for him to call back

## 2020-09-05 NOTE — Telephone Encounter (Signed)
John Hanson from [facility] calling for clarification - ph#[phone]

## 2020-09-09 ENCOUNTER — Encounter (HOSPITAL_COMMUNITY): Payer: Medicare HMO | Admitting: Physical Therapy

## 2020-09-11 ENCOUNTER — Encounter (HOSPITAL_COMMUNITY): Payer: Medicare HMO | Admitting: Physical Therapy

## 2020-09-17 ENCOUNTER — Encounter (HOSPITAL_COMMUNITY): Payer: Medicare HMO | Admitting: Physical Therapy

## 2020-09-19 ENCOUNTER — Encounter (HOSPITAL_COMMUNITY): Payer: Medicare HMO | Admitting: Physical Therapy

## 2020-09-24 ENCOUNTER — Encounter (HOSPITAL_COMMUNITY): Payer: Medicare HMO | Admitting: Physical Therapy

## 2020-09-25 ENCOUNTER — Encounter (HOSPITAL_COMMUNITY): Payer: Medicare HMO | Admitting: Physical Therapy

## 2020-09-30 ENCOUNTER — Encounter (HOSPITAL_COMMUNITY): Payer: Medicare HMO | Admitting: Physical Therapy

## 2020-10-02 ENCOUNTER — Encounter (HOSPITAL_COMMUNITY): Payer: Medicare HMO | Admitting: Physical Therapy

## 2020-10-25 ENCOUNTER — Encounter (HOSPITAL_COMMUNITY): Payer: Self-pay | Admitting: Physical Therapy

## 2020-10-25 NOTE — Therapy (Signed)
[place], [place], [address] Phone: [phone]   Fax:  [phone]  Patient Details  Name: John Hanson MRN: [id number] Date of Birth: 1941/06/12 Referring Provider:  No ref. provider found  Encounter Date: 10/25/2020   PHYSICAL THERAPY DISCHARGE SUMMARY  Visits from Start of Care: 1  Current functional level related to goals / functional outcomes: unknown   Remaining deficits: unknown   Education / Equipment: HEP Plan: Patient agrees to discharge.  Patient goals were not met. Patient is being discharged due to financial reasons.  ?????      Rayetta Humphrey, PT CLT [phone] 10/25/2020, 12:17 PM  [facility] [address] [place], [place], [address] Phone: [phone]   Fax:  [phone]

## 2020-11-21 DIAGNOSIS — Z0001 Encounter for general adult medical examination with abnormal findings: Secondary | ICD-10-CM | POA: Diagnosis not present

## 2020-11-21 DIAGNOSIS — I10 Essential (primary) hypertension: Secondary | ICD-10-CM | POA: Diagnosis not present

## 2020-11-21 DIAGNOSIS — F5101 Primary insomnia: Secondary | ICD-10-CM | POA: Diagnosis not present

## 2020-11-21 DIAGNOSIS — M1711 Unilateral primary osteoarthritis, right knee: Secondary | ICD-10-CM | POA: Diagnosis not present

## 2020-11-21 DIAGNOSIS — I77811 Abdominal aortic ectasia: Secondary | ICD-10-CM | POA: Diagnosis not present

## 2020-11-21 DIAGNOSIS — F419 Anxiety disorder, unspecified: Secondary | ICD-10-CM | POA: Diagnosis not present

## 2020-11-21 DIAGNOSIS — F5221 Male erectile disorder: Secondary | ICD-10-CM | POA: Diagnosis not present

## 2020-11-21 DIAGNOSIS — K219 Gastro-esophageal reflux disease without esophagitis: Secondary | ICD-10-CM | POA: Diagnosis not present

## 2020-11-21 DIAGNOSIS — E7800 Pure hypercholesterolemia, unspecified: Secondary | ICD-10-CM | POA: Diagnosis not present

## 2020-11-21 DIAGNOSIS — R7309 Other abnormal glucose: Secondary | ICD-10-CM | POA: Diagnosis not present

## 2020-11-21 DIAGNOSIS — Z79899 Other long term (current) drug therapy: Secondary | ICD-10-CM | POA: Diagnosis not present

## 2020-12-25 ENCOUNTER — Ambulatory Visit: Payer: Medicare HMO | Admitting: Orthopedic Surgery

## 2020-12-25 ENCOUNTER — Other Ambulatory Visit: Payer: Self-pay

## 2020-12-25 ENCOUNTER — Encounter: Payer: Self-pay | Admitting: Orthopedic Surgery

## 2020-12-25 VITALS — BP 153/89 | HR 108 | Ht 68.5 in | Wt 254.0 lb

## 2020-12-25 DIAGNOSIS — M5416 Radiculopathy, lumbar region: Secondary | ICD-10-CM | POA: Diagnosis not present

## 2020-12-25 MED ORDER — GABAPENTIN 100 MG PO CAPS: 100.0000 mg | ORAL_CAPSULE | Freq: Three times a day (TID) | ORAL | 2 refills | Status: DC

## 2020-12-25 NOTE — Patient Instructions (Addendum)
Dr Susa Day   [address]  [place] [phone]  Start gabapentin Lumbosacral Radiculopathy Lumbosacral radiculopathy is a condition that involves the spinal nerves and nerve roots in the low back and bottom of the spine. The condition develops when these nerves and nerve roots move out of place or become inflamed and cause symptoms. What are the causes? This condition may be caused by:  Pressure from a disk that bulges out of place (herniated disk). A disk is a plate of soft cartilage that separates bones in the spine.  Disk changes that occur with age (disk degeneration).  A narrowing of the bones of the lower back (spinal stenosis).  A tumor.  An infection.  An injury that places sudden pressure on the disks that cushion the bones of your lower spine. What increases the risk? You are more likely to develop this condition if:  You are a male who is 3-13 years old.  You are a male who is 51-29 years old.  You use improper technique when lifting things.  You are overweight or live a sedentary lifestyle.  You smoke.  Your work requires frequent lifting.  You do repetitive activities that strain the spine. What are the signs or symptoms? Symptoms of this condition include:  Pain that goes down from your back into your legs (sciatica), usually on one side of the body. This is the most common symptom. The pain may be worse with sitting, coughing, or sneezing.  Pain and numbness in your legs.  Muscle weakness.  Tingling.  Loss of bladder control or bowel control.   How is this diagnosed? This condition may be diagnosed based on:  Your symptoms and medical history.  A physical exam. If the pain is lasting, you may have tests, such as:  MRI scan.  X-ray.  CT scan.  A type of X-ray used to examine the spinal canal after injecting a dye into your spine (myelogram).  A test to measure how electrical impulses move through a nerve (nerve  conduction study). How is this treated? Treatment may depend on the cause of the condition and may include:  Working with a physical therapist.  Taking pain medicine.  Applying heat and ice to affected areas.  Doing stretches to improve flexibility.  Doing exercises to strengthen back muscles.  Having chiropractic spinal manipulation.  Using transcutaneous electrical nerve stimulation (TENS) therapy.  Getting a steroid injection in the spine. In some cases, no treatment is needed. If the condition is long-lasting (chronic), or if symptoms are severe, treatment may involve surgery or lifestyle changes, such as following a weight-loss plan. Follow these instructions at home: Activity  Avoid bending and other activities that make the problem worse.  Maintain a proper position when standing or sitting: ? When standing, keep your upper back and neck straight, with your shoulders pulled back. Avoid slouching. ? When sitting, keep your back straight and relax your shoulders. Do not round your shoulders or pull them backward.  Do not sit or stand in one place for long periods of time.  Take brief periods of rest throughout the day. This will reduce your pain. It is usually better to rest by lying down or standing, not sitting.  When you are resting for longer periods, mix in some mild activity or stretching between periods of rest. This will help to prevent stiffness and pain.  Get regular exercise. Ask your health care provider what activities are safe for you. If you were shown how  to do any exercises or stretches, do them as directed by your health care provider.  Do not lift anything that is heavier than 10 lb (4.5 kg) or the limit that you are told by your health care provider. Always use proper lifting technique, which includes: ? Bending your knees. ? Keeping the load close to your body. ? Avoiding twisting. Managing pain  If directed, put ice on the affected area: ? Put  ice in a plastic bag. ? Place a towel between your skin and the bag. ? Leave the ice on for 20 minutes, 2-3 times a day.  If directed, apply heat to the affected area as often as told by your health care provider. Use the heat source that your health care provider recommends, such as a moist heat pack or a heating pad. ? Place a towel between your skin and the heat source. ? Leave the heat on for 20-30 minutes. ? Remove the heat if your skin turns bright red. This is especially important if you are unable to feel pain, heat, or cold. You may have a greater risk of getting burned.  Take over-the-counter and prescription medicines only as told by your health care provider. General instructions  Sleep on a firm mattress in a comfortable position. Try lying on your side with your knees slightly bent. If you lie on your back, put a pillow under your knees.  Do not drive or use heavy machinery while taking prescription pain medicine.  If your health care provider prescribed a diet or exercise program, follow it as directed.  Keep all follow-up visits as told by your health care provider. This is important. Contact a health care provider if:  Your pain does not improve over time, even when taking pain medicines. Get help right away if:  You develop severe pain.  Your pain suddenly gets worse.  You develop increasing weakness in your legs.  You lose the ability to control your bladder or bowel.  You have difficulty walking or balancing.  You have a fever. Summary  Lumbosacral radiculopathy is a condition that occurs when the spinal nerves and nerve roots in the lower part of the spine move out of place or become inflamed and cause symptoms.  Symptoms include pain, numbness, and tingling that go down from your back into your legs (sciatica), muscle weakness, and loss of bladder control or bowel control.  If directed, apply ice or heat to the affected area as told by your health care  provider.  Follow instructions about activity, rest, and proper lifting technique. This information is not intended to replace advice given to you by your health care provider. Make sure you discuss any questions you have with your health care provider. Document Revised: 07/25/2020 Document Reviewed: 07/25/2020 Elsevier Patient Education  2021 [employer].

## 2020-12-25 NOTE — Progress Notes (Signed)
Chief Complaint  Patient presents with  . Knee Pain    Right, no injury swelling and pain, tried OTC creams no relief   80 year old male status post lumbar decompression L4-5 L3-4 2016 by Dr. Dellis Filbert pain presents with right leg pain behind his knee radiating into his foot starting up in the right hip  Review of systems joint pain hematuria back in neck pain muscle aches  All other systems reported negative also has aortic stenosis  Past Medical History:  Diagnosis Date  . Anxiety   . Aortic stenosis    a. 06/2015 Echo: EF 50-55%, mild diff HK, Gr1 DD, mild AS w/ restricted mobility of R coronary and non-coronary cusp, mildly dil Ao root (16mm), mildly dil LA, nl RV fxn, mild TR/PR, PASP 16mmHg.  . Arthritis   . Depression   . Essential hypertension   . GERD (gastroesophageal reflux disease)   . Numbness and tingling    fingertips bilat   . Osteoarthritis    a. R>L hip pending R THA.  . Sleep apnea    a. pt states does not use CPAP machine   . Varicose veins    Past Surgical History:  Procedure Laterality Date  . ENDOVENOUS ABLATION SAPHENOUS VEIN W/ LASER Right 05-31-2014   EVLA  right gretaer saphenous vein by Curt Jews MD  . LUMBAR LAMINECTOMY/DECOMPRESSION MICRODISCECTOMY N/A 09/04/2015   Procedure: MICRO LUMBAR DECOMPRESSION L4 - L5,  L3 - L4 2 LEVELS;  Surgeon: Susa Day, MD;  Location: [facility];  Service: Orthopedics;  Laterality: N/A;  . QUADRICEPS TENDON REPAIR Left 02/24/2018   Procedure: LEFT QUADRICEPS TENDON REPAIR;  Surgeon: Carole Civil, MD;  Location: [facility];  Service: Orthopedics;  Laterality: Left;  . TOTAL HIP ARTHROPLASTY Right 03/26/2016   Procedure: RIGHT TOTAL HIP ARTHROPLASTY ANTERIOR APPROACH;  Surgeon: Rod Can, MD;  Location: [facility];  Service: Orthopedics;  Laterality: Right;  . TOTAL HIP ARTHROPLASTY Left 07/30/2016   Procedure: TOTAL HIP ARTHROPLASTY ANTERIOR APPROACH;  Surgeon: Rod Can, MD;  Location: [facility];  Service:  Orthopedics;  Laterality: Left;   Physical Exam Constitutional:      General: He is not in acute distress.    Appearance: He is well-developed.     Comments: Well developed, well nourished Normal grooming and hygiene     Cardiovascular:     Comments: No peripheral edema Musculoskeletal:     Right knee: No swelling, deformity, effusion, erythema, ecchymosis, lacerations or bony tenderness. Decreased range of motion. No tenderness. No ACL laxity or PCL laxity.     Comments: Upon palpation of his knee joint he had no tenderness on either side or front of the knee.  He did have pain in the back of his knee but this continued to involve his lower leg posterior thigh and upper right buttock area  Also had tenderness in his lower back  Skin:    General: Skin is warm and dry.  Neurological:     Mental Status: He is alert and oriented to person, place, and time.     Sensory: No sensory deficit.     Motor: Weakness present.     Coordination: Coordination normal.     Gait: Gait abnormal.     Deep Tendon Reflexes: Reflexes are normal and symmetric.  Psychiatric:        Mood and Affect: Mood normal.        Behavior: Behavior normal.        Thought Content: Thought content normal.  Judgment: Judgment normal.     Comments: Affect normal      BP (!) 153/89   Pulse (!) 108   Ht 5' 8.5" (1.74 m)   Wt 254 lb (115.2 kg)   BMI 38.06 kg/m   In 2018 his postoperative studies showed CT LUMBAR MYELOGRAM IMPRESSION:   Severe stenosis at L3-4 due to short pedicles, advanced posterior element hypertrophy, 2 mm slip, and osseous ridging. LEFT greater than RIGHT L4 and L3 nerve root impingement. Partial regrowth of posterior elements.   Postsurgical changes at L4-5; adequate posterior decompression, however the thecal sac is draped over osseous ridging and calcified protrusion. Posterior element hypertrophy. RIGHT greater than LEFT L4 and L5 nerve root impingement.   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 RIGHT greater than LEFT L2 and L3 nerve root impingement.     Electronically Signed   By: Staci Righter M.D.   On: 04/19/2017 13:50   Unfortunately not much I can do in this situation I did put him on some gabapentin and send him back to Dr. Marlou Sa for further work-up possible epidural injections or chronic pain management  Encounter Diagnosis  Name Primary?  . Radiculopathy, lumbar region Yes

## 2020-12-30 DIAGNOSIS — M5459 Other low back pain: Secondary | ICD-10-CM | POA: Diagnosis not present

## 2020-12-30 DIAGNOSIS — M545 Low back pain, unspecified: Secondary | ICD-10-CM | POA: Insufficient documentation

## 2021-01-09 DIAGNOSIS — Z23 Encounter for immunization: Secondary | ICD-10-CM | POA: Diagnosis not present

## 2021-01-22 DIAGNOSIS — M545 Low back pain, unspecified: Secondary | ICD-10-CM | POA: Diagnosis not present

## 2021-01-31 DIAGNOSIS — M48061 Spinal stenosis, lumbar region without neurogenic claudication: Secondary | ICD-10-CM | POA: Diagnosis not present

## 2021-02-27 DIAGNOSIS — M48062 Spinal stenosis, lumbar region with neurogenic claudication: Secondary | ICD-10-CM | POA: Diagnosis not present

## 2021-02-27 DIAGNOSIS — M5416 Radiculopathy, lumbar region: Secondary | ICD-10-CM | POA: Diagnosis not present

## 2021-03-11 DIAGNOSIS — M545 Low back pain, unspecified: Secondary | ICD-10-CM | POA: Diagnosis not present

## 2021-04-09 IMAGING — US US EXTREM LOW VENOUS*R*
1 series · 13 of 24 positions shown · non-contrast
Comparison: None.

CLINICAL DATA: CHRONIC RIGHT LEG PAIN AND SWELLING



[Series 1: us extrem low venous*right* · 0.09mm/px · 13 of 31 slices shown]
[im 1/31]
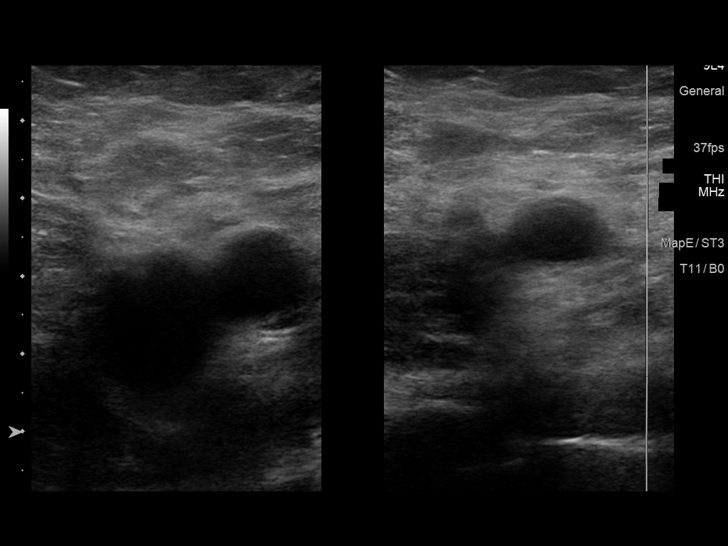
[im 3/31]
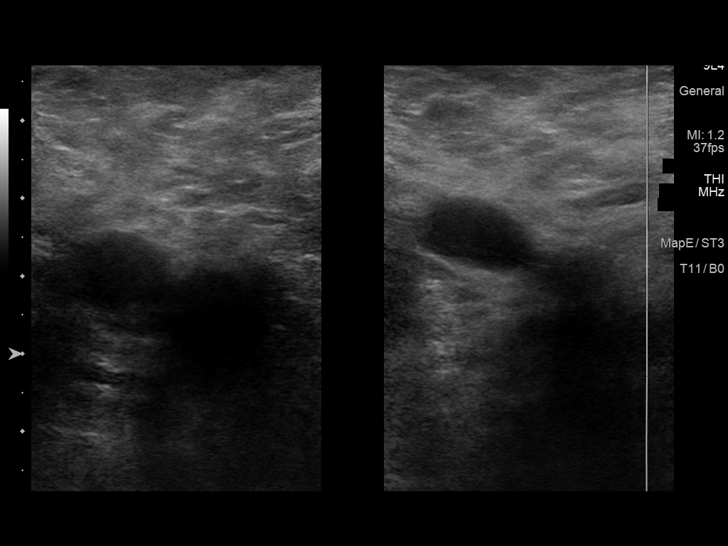
[im 6/31]
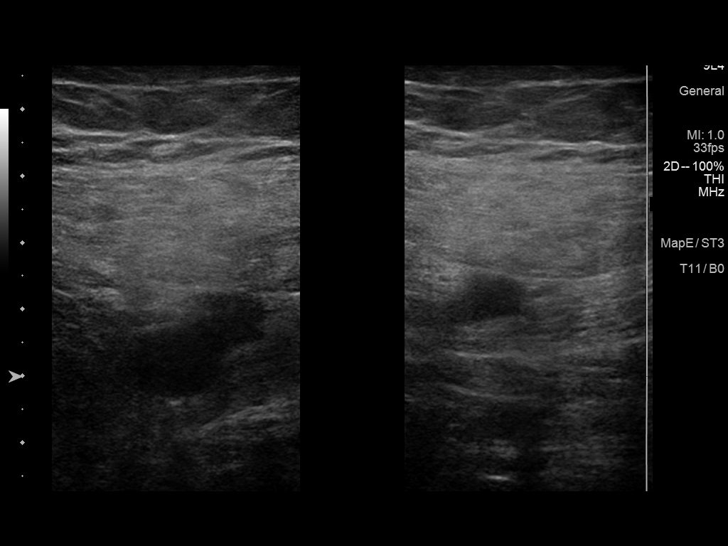
[im 8/31]
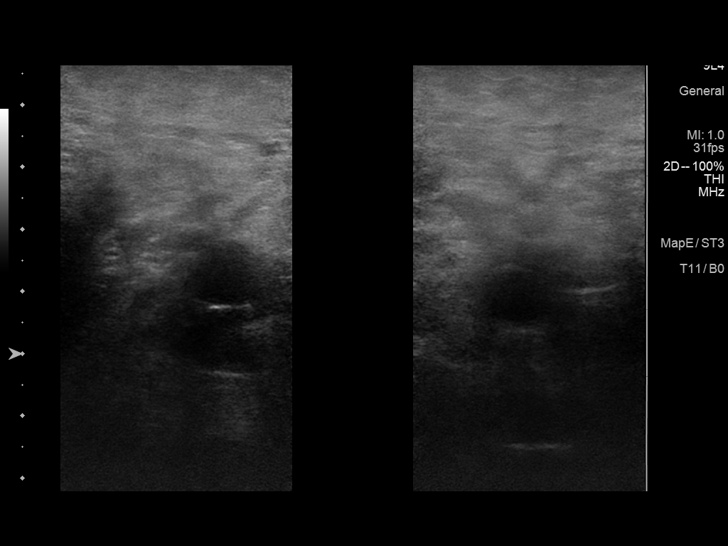
[im 11/31]
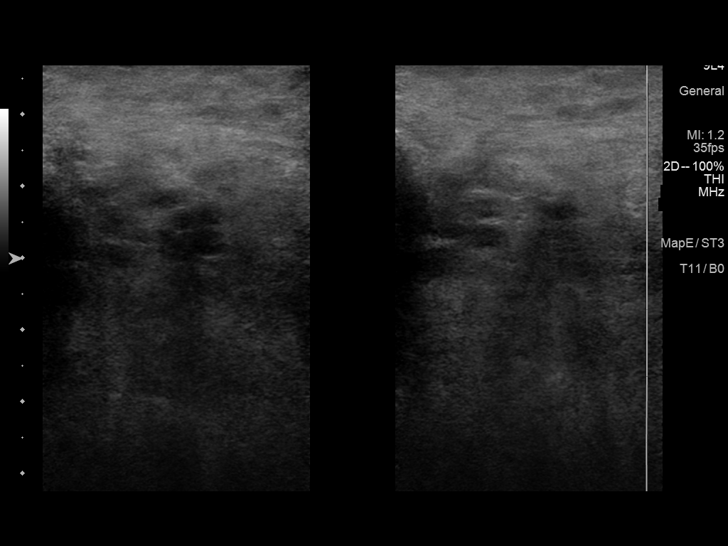
[im 14/31]
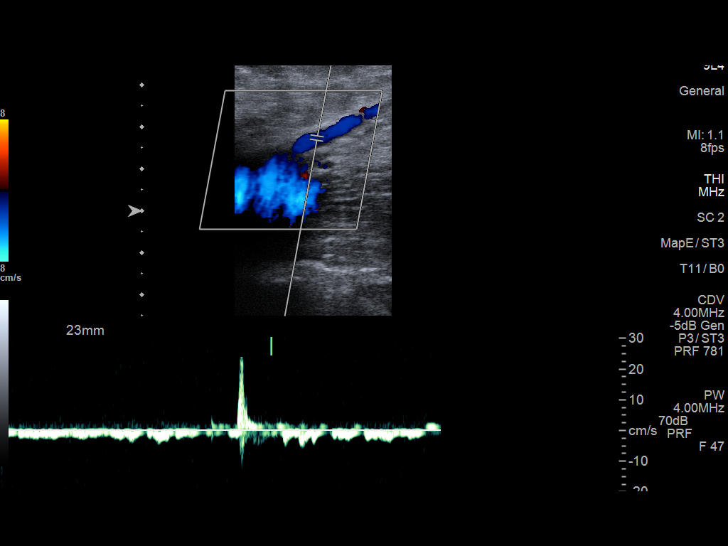
[im 16/31]
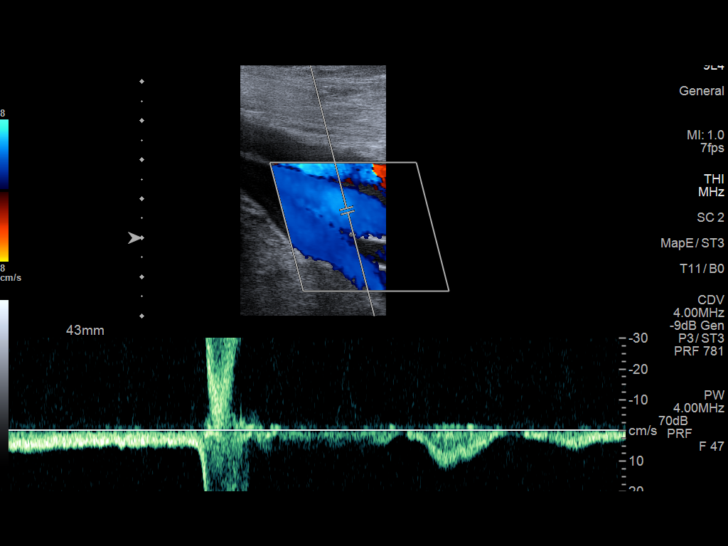
[im 17/31]
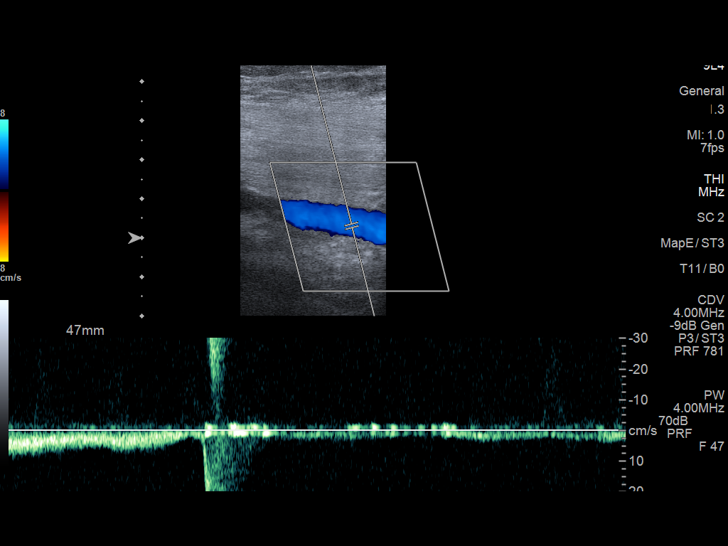
[im 20/31]
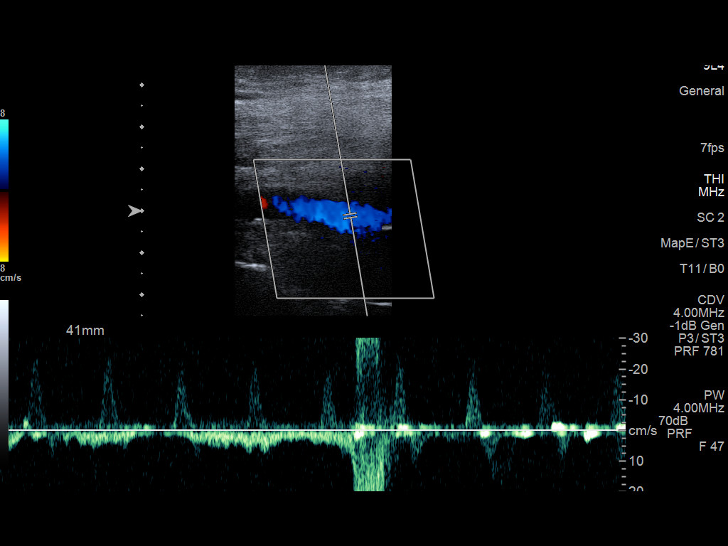
[im 23/31]
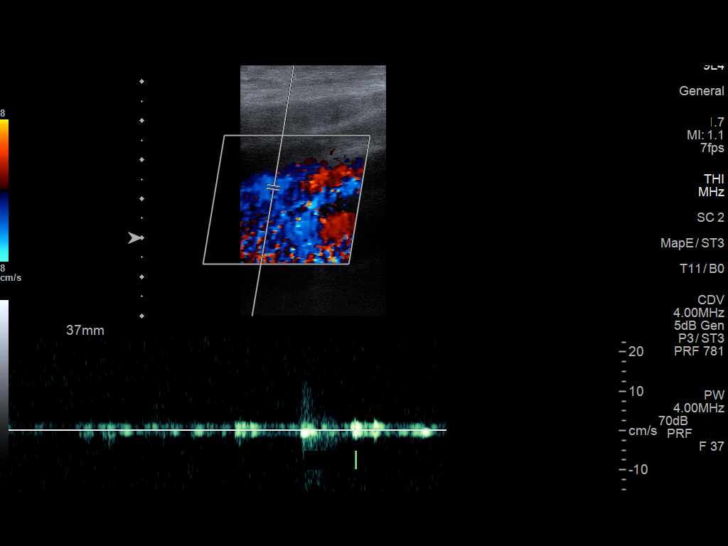
[im 25/31]
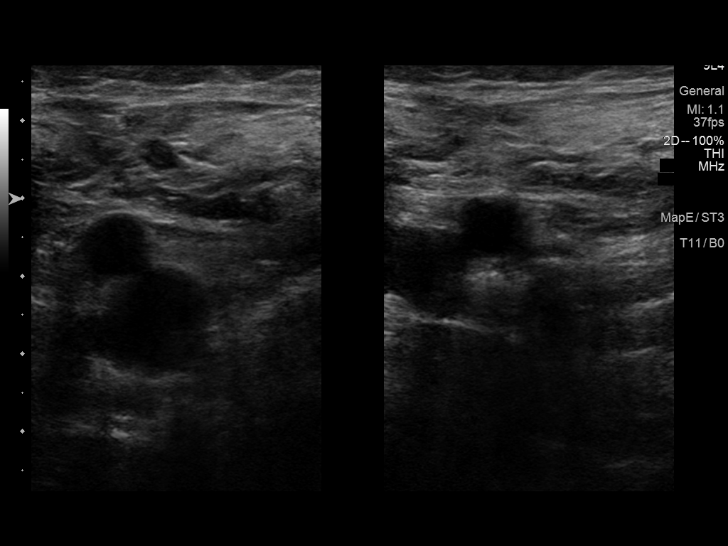
[im 28/31]
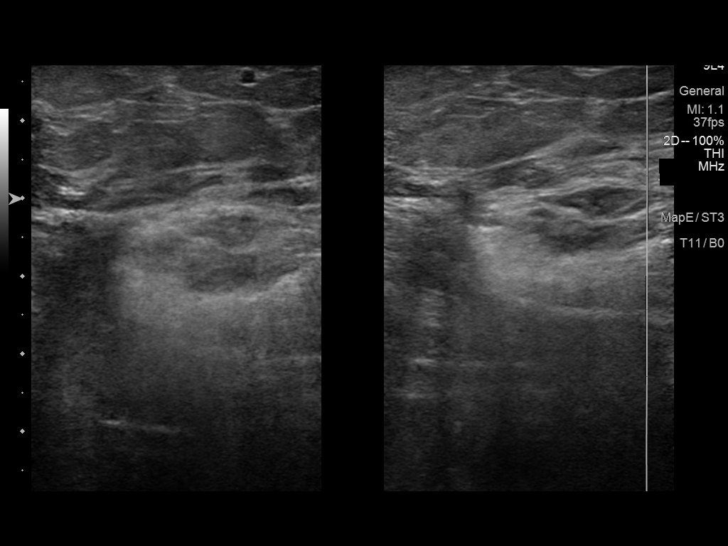
[im 31/31]
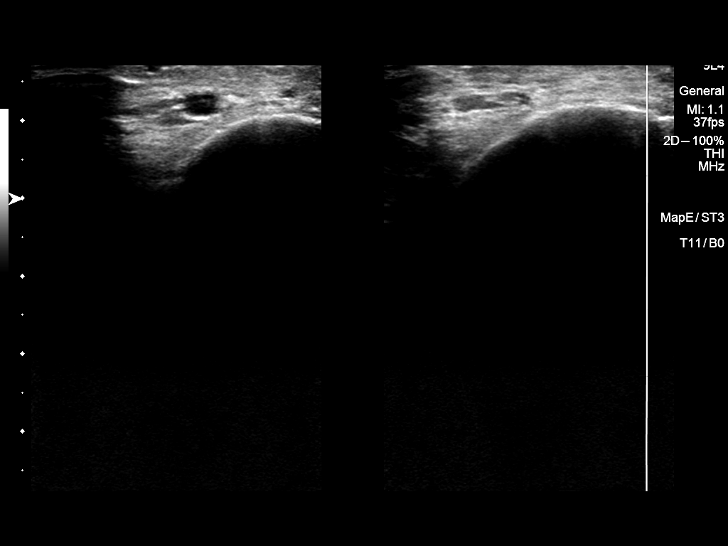

[13 of 24 positions shown; findings below may reference images not displayed]

FINDINGS: Contralateral Common Femoral Vein: Respiratory phasicity is normal
and symmetric with the symptomatic side. No evidence of thrombus.
Normal compressibility.

Common Femoral Vein: No evidence of thrombus. Normal
compressibility, respiratory phasicity and response to augmentation.

Saphenofemoral Junction: No evidence of thrombus. Normal
compressibility and flow on color Doppler imaging.

Profunda Femoral Vein: No evidence of thrombus. Normal
compressibility and flow on color Doppler imaging.

Femoral Vein: No evidence of thrombus. Normal compressibility,
respiratory phasicity and response to augmentation.

Popliteal Vein: No evidence of thrombus. Normal compressibility,
respiratory phasicity and response to augmentation.

Calf Veins: No evidence of thrombus. Normal compressibility and flow
on color Doppler imaging.

Superficial Great Saphenous Vein: No evidence of thrombus. Normal
compressibility.
IMPRESSION: No evidence of deep venous thrombosis.

## 2021-06-09 DIAGNOSIS — M1990 Unspecified osteoarthritis, unspecified site: Secondary | ICD-10-CM | POA: Diagnosis not present

## 2021-06-09 DIAGNOSIS — I77811 Abdominal aortic ectasia: Secondary | ICD-10-CM | POA: Diagnosis not present

## 2021-06-09 DIAGNOSIS — F419 Anxiety disorder, unspecified: Secondary | ICD-10-CM | POA: Diagnosis not present

## 2021-06-09 DIAGNOSIS — I10 Essential (primary) hypertension: Secondary | ICD-10-CM | POA: Diagnosis not present

## 2021-06-09 DIAGNOSIS — G8929 Other chronic pain: Secondary | ICD-10-CM | POA: Diagnosis not present

## 2021-06-09 DIAGNOSIS — Z79899 Other long term (current) drug therapy: Secondary | ICD-10-CM | POA: Diagnosis not present

## 2021-06-09 DIAGNOSIS — E7800 Pure hypercholesterolemia, unspecified: Secondary | ICD-10-CM | POA: Diagnosis not present

## 2021-06-18 DIAGNOSIS — Z23 Encounter for immunization: Secondary | ICD-10-CM | POA: Diagnosis not present

## 2021-06-25 ENCOUNTER — Other Ambulatory Visit: Payer: Self-pay

## 2021-06-25 ENCOUNTER — Encounter: Payer: Self-pay | Admitting: Orthopedic Surgery

## 2021-06-25 ENCOUNTER — Ambulatory Visit: Payer: Medicare HMO | Admitting: Orthopedic Surgery

## 2021-06-25 ENCOUNTER — Ambulatory Visit: Payer: Medicare HMO

## 2021-06-25 VITALS — BP 104/70 | HR 75 | Ht 68.5 in | Wt 248.0 lb

## 2021-06-25 DIAGNOSIS — Z860100 Personal history of colon polyps, unspecified: Secondary | ICD-10-CM | POA: Insufficient documentation

## 2021-06-25 DIAGNOSIS — I77811 Abdominal aortic ectasia: Secondary | ICD-10-CM | POA: Insufficient documentation

## 2021-06-25 DIAGNOSIS — K921 Melena: Secondary | ICD-10-CM | POA: Insufficient documentation

## 2021-06-25 DIAGNOSIS — E7800 Pure hypercholesterolemia, unspecified: Secondary | ICD-10-CM | POA: Insufficient documentation

## 2021-06-25 DIAGNOSIS — G4700 Insomnia, unspecified: Secondary | ICD-10-CM | POA: Insufficient documentation

## 2021-06-25 DIAGNOSIS — S86812A Strain of other muscle(s) and tendon(s) at lower leg level, left leg, initial encounter: Secondary | ICD-10-CM

## 2021-06-25 DIAGNOSIS — F5221 Male erectile disorder: Secondary | ICD-10-CM | POA: Insufficient documentation

## 2021-06-25 DIAGNOSIS — M25562 Pain in left knee: Secondary | ICD-10-CM

## 2021-06-25 DIAGNOSIS — M1610 Unilateral primary osteoarthritis, unspecified hip: Secondary | ICD-10-CM | POA: Insufficient documentation

## 2021-06-25 DIAGNOSIS — M5137 Other intervertebral disc degeneration, lumbosacral region: Secondary | ICD-10-CM | POA: Insufficient documentation

## 2021-06-25 DIAGNOSIS — M109 Gout, unspecified: Secondary | ICD-10-CM | POA: Insufficient documentation

## 2021-06-25 DIAGNOSIS — K219 Gastro-esophageal reflux disease without esophagitis: Secondary | ICD-10-CM | POA: Insufficient documentation

## 2021-06-25 DIAGNOSIS — E785 Hyperlipidemia, unspecified: Secondary | ICD-10-CM | POA: Insufficient documentation

## 2021-06-25 DIAGNOSIS — I77819 Aortic ectasia, unspecified site: Secondary | ICD-10-CM | POA: Insufficient documentation

## 2021-06-25 DIAGNOSIS — F419 Anxiety disorder, unspecified: Secondary | ICD-10-CM | POA: Insufficient documentation

## 2021-06-25 DIAGNOSIS — M1711 Unilateral primary osteoarthritis, right knee: Secondary | ICD-10-CM | POA: Insufficient documentation

## 2021-06-26 ENCOUNTER — Encounter: Payer: Self-pay | Admitting: Orthopedic Surgery

## 2021-06-26 NOTE — Progress Notes (Signed)
Chief Complaint  Patient presents with   Leg Pain    Left, back of knee   80 year old male status post quadriceps tendon repair with history of severe degenerative disc disease felt a pop in his left calf last Tuesday complains of medial tenderness and painful gait  Exam patient is ambulatory with a cane  He has no swelling but does have tenderness over the medial calf no popliteal tenderness knee has no effusion and no pain around the knee joint itself passive range of motion is 125 degrees there is a well-healed scar anteriorly the knee feels stable there is no loss of muscle strength and muscle tone is normal he has normal sensation in the leg with good pulses  X-ray of the knee shows degenerative arthritis no acute fracture  Impression  Encounter Diagnoses  Name Primary?   Acute pain of left knee Yes   Strain of calf muscle, left, initial encounter     Plan supportive wrap, heat, ibuprofen for 3 days, return in 3 weeks

## 2021-07-03 ENCOUNTER — Telehealth: Payer: Self-pay | Admitting: Orthopedic Surgery

## 2021-07-03 NOTE — Telephone Encounter (Signed)
Patient called to relay he his still having pain in the left knee that he was seen for 06/25/21. He is asking if there may be any medication Dr Aline Brochure would prescribe?  Or would he need to be seen sooner (we discussed 13th or 17th) If prescription - patient uses [facility]

## 2021-07-04 MED ORDER — IBUPROFEN 800 MG PO TABS: 800.0000 mg | ORAL_TABLET | Freq: Three times a day (TID) | ORAL | 0 refills | Status: DC | PRN

## 2021-07-07 NOTE — Telephone Encounter (Signed)
Relayed to patient; voiced understanding.

## 2021-07-10 DIAGNOSIS — H524 Presbyopia: Secondary | ICD-10-CM | POA: Diagnosis not present

## 2021-07-10 DIAGNOSIS — H401112 Primary open-angle glaucoma, right eye, moderate stage: Secondary | ICD-10-CM | POA: Diagnosis not present

## 2021-07-17 ENCOUNTER — Ambulatory Visit: Payer: Medicare HMO | Admitting: Orthopedic Surgery

## 2021-07-17 ENCOUNTER — Encounter: Payer: Self-pay | Admitting: Orthopedic Surgery

## 2021-07-17 ENCOUNTER — Other Ambulatory Visit: Payer: Self-pay

## 2021-07-17 VITALS — BP 152/85 | HR 77 | Ht 68.5 in | Wt 248.0 lb

## 2021-07-17 DIAGNOSIS — M25562 Pain in left knee: Secondary | ICD-10-CM

## 2021-07-17 DIAGNOSIS — S86812D Strain of other muscle(s) and tendon(s) at lower leg level, left leg, subsequent encounter: Secondary | ICD-10-CM

## 2021-07-17 MED ORDER — IBUPROFEN 800 MG PO TABS: 800.0000 mg | ORAL_TABLET | Freq: Three times a day (TID) | ORAL | 0 refills | Status: DC | PRN

## 2021-07-17 NOTE — Progress Notes (Signed)
Follow-up appointment  Chief Complaint  Patient presents with   Knee Pain    Left/ feels better but still sore around calf area     Encounter Diagnoses  Name Primary?   Acute pain of left knee Yes   Strain of calf muscle, left, subsequent encounter     80 year old male felt a pop in the back of his calf treated for calf strain with local topical medications and supportive wrap presents for follow-up  HE S GETTING BETTER BUT STILL SORE   Physical Exam Musculoskeletal:       Legs:     Encounter Diagnoses  Name Primary?   Acute pain of left knee Yes   Strain of calf muscle, left, subsequent encounter     RED IBUPROFEN AND SLEEVE FOR COMPRESSION   CONTINUE HEAT AND TOPICAL VOLTAREN   Meds ordered this encounter  Medications   ibuprofen (ADVIL) 800 MG tablet    Sig: Take 1 tablet (800 mg total) by mouth every 8 (eight) hours as needed.    Dispense:  90 tablet    Refill:  0

## 2021-07-24 DIAGNOSIS — Z23 Encounter for immunization: Secondary | ICD-10-CM | POA: Diagnosis not present

## 2021-07-28 DIAGNOSIS — H401112 Primary open-angle glaucoma, right eye, moderate stage: Secondary | ICD-10-CM | POA: Diagnosis not present

## 2021-07-28 DIAGNOSIS — Z01818 Encounter for other preprocedural examination: Secondary | ICD-10-CM | POA: Diagnosis not present

## 2021-07-28 DIAGNOSIS — H401121 Primary open-angle glaucoma, left eye, mild stage: Secondary | ICD-10-CM | POA: Diagnosis not present

## 2021-07-28 DIAGNOSIS — H25811 Combined forms of age-related cataract, right eye: Secondary | ICD-10-CM | POA: Diagnosis not present

## 2021-07-28 DIAGNOSIS — H25812 Combined forms of age-related cataract, left eye: Secondary | ICD-10-CM | POA: Diagnosis not present

## 2021-08-07 ENCOUNTER — Other Ambulatory Visit: Payer: Self-pay

## 2021-08-07 ENCOUNTER — Ambulatory Visit: Payer: Medicare HMO | Admitting: Orthopedic Surgery

## 2021-08-07 ENCOUNTER — Encounter: Payer: Self-pay | Admitting: Orthopedic Surgery

## 2021-08-07 VITALS — BP 146/79 | HR 71 | Ht 68.5 in | Wt 250.0 lb

## 2021-08-07 DIAGNOSIS — S86812D Strain of other muscle(s) and tendon(s) at lower leg level, left leg, subsequent encounter: Secondary | ICD-10-CM

## 2021-08-07 DIAGNOSIS — G8929 Other chronic pain: Secondary | ICD-10-CM | POA: Insufficient documentation

## 2021-08-07 NOTE — Progress Notes (Signed)
FOLLOW UP   Encounter Diagnosis  Name Primary?   Strain of calf muscle, left, subsequent encounter Yes     Chief Complaint  Patient presents with   Knee Pain    Better taking ibuprofen and using heat on it, cold weather has made everything hurt      80 year old male with a calf strain back in September was treated conservatively now just has some soreness behind his knee  Focused left knee exam shows a well-healed scar from her previous quadriceps rupture repair slight loss of extension about 2 or 3 degrees flexion of about 110 degrees  No tenderness in the medial calf  Encounter Diagnosis  Name Primary?   Strain of calf muscle, left, subsequent encounter Yes     Released

## 2021-08-08 ENCOUNTER — Other Ambulatory Visit: Payer: Self-pay | Admitting: Orthopedic Surgery

## 2021-08-08 DIAGNOSIS — S86812D Strain of other muscle(s) and tendon(s) at lower leg level, left leg, subsequent encounter: Secondary | ICD-10-CM

## 2021-08-14 DIAGNOSIS — H401112 Primary open-angle glaucoma, right eye, moderate stage: Secondary | ICD-10-CM | POA: Diagnosis not present

## 2021-08-14 DIAGNOSIS — H25811 Combined forms of age-related cataract, right eye: Secondary | ICD-10-CM | POA: Diagnosis not present

## 2021-08-27 DIAGNOSIS — E7800 Pure hypercholesterolemia, unspecified: Secondary | ICD-10-CM | POA: Diagnosis not present

## 2021-08-27 DIAGNOSIS — M1711 Unilateral primary osteoarthritis, right knee: Secondary | ICD-10-CM | POA: Diagnosis not present

## 2021-08-27 DIAGNOSIS — M1610 Unilateral primary osteoarthritis, unspecified hip: Secondary | ICD-10-CM | POA: Diagnosis not present

## 2021-08-27 DIAGNOSIS — E785 Hyperlipidemia, unspecified: Secondary | ICD-10-CM | POA: Diagnosis not present

## 2021-08-27 DIAGNOSIS — G4700 Insomnia, unspecified: Secondary | ICD-10-CM | POA: Diagnosis not present

## 2021-08-27 DIAGNOSIS — K219 Gastro-esophageal reflux disease without esophagitis: Secondary | ICD-10-CM | POA: Diagnosis not present

## 2021-08-27 DIAGNOSIS — M1990 Unspecified osteoarthritis, unspecified site: Secondary | ICD-10-CM | POA: Diagnosis not present

## 2021-08-27 DIAGNOSIS — G8929 Other chronic pain: Secondary | ICD-10-CM | POA: Diagnosis not present

## 2021-08-27 DIAGNOSIS — I10 Essential (primary) hypertension: Secondary | ICD-10-CM | POA: Diagnosis not present

## 2021-09-04 DIAGNOSIS — H25812 Combined forms of age-related cataract, left eye: Secondary | ICD-10-CM | POA: Diagnosis not present

## 2021-09-04 DIAGNOSIS — H401121 Primary open-angle glaucoma, left eye, mild stage: Secondary | ICD-10-CM | POA: Diagnosis not present

## 2021-09-04 DIAGNOSIS — H401122 Primary open-angle glaucoma, left eye, moderate stage: Secondary | ICD-10-CM | POA: Diagnosis not present

## 2021-09-11 DIAGNOSIS — H2512 Age-related nuclear cataract, left eye: Secondary | ICD-10-CM | POA: Diagnosis not present

## 2021-10-28 DIAGNOSIS — L84 Corns and callosities: Secondary | ICD-10-CM | POA: Diagnosis not present

## 2021-10-28 DIAGNOSIS — B351 Tinea unguium: Secondary | ICD-10-CM | POA: Diagnosis not present

## 2021-10-28 DIAGNOSIS — I70203 Unspecified atherosclerosis of native arteries of extremities, bilateral legs: Secondary | ICD-10-CM | POA: Diagnosis not present

## 2021-10-28 DIAGNOSIS — M79676 Pain in unspecified toe(s): Secondary | ICD-10-CM | POA: Diagnosis not present

## 2021-12-12 DIAGNOSIS — Z79899 Other long term (current) drug therapy: Secondary | ICD-10-CM | POA: Diagnosis not present

## 2021-12-12 DIAGNOSIS — E7800 Pure hypercholesterolemia, unspecified: Secondary | ICD-10-CM | POA: Diagnosis not present

## 2021-12-12 DIAGNOSIS — M1711 Unilateral primary osteoarthritis, right knee: Secondary | ICD-10-CM | POA: Diagnosis not present

## 2021-12-12 DIAGNOSIS — Z0000 Encounter for general adult medical examination without abnormal findings: Secondary | ICD-10-CM | POA: Diagnosis not present

## 2021-12-12 DIAGNOSIS — G8929 Other chronic pain: Secondary | ICD-10-CM | POA: Diagnosis not present

## 2021-12-12 DIAGNOSIS — F5101 Primary insomnia: Secondary | ICD-10-CM | POA: Diagnosis not present

## 2021-12-12 DIAGNOSIS — R7303 Prediabetes: Secondary | ICD-10-CM | POA: Diagnosis not present

## 2021-12-12 DIAGNOSIS — R7309 Other abnormal glucose: Secondary | ICD-10-CM | POA: Diagnosis not present

## 2021-12-12 DIAGNOSIS — I77811 Abdominal aortic ectasia: Secondary | ICD-10-CM | POA: Diagnosis not present

## 2021-12-12 DIAGNOSIS — M109 Gout, unspecified: Secondary | ICD-10-CM | POA: Diagnosis not present

## 2022-01-06 DIAGNOSIS — I70203 Unspecified atherosclerosis of native arteries of extremities, bilateral legs: Secondary | ICD-10-CM | POA: Diagnosis not present

## 2022-01-06 DIAGNOSIS — M79676 Pain in unspecified toe(s): Secondary | ICD-10-CM | POA: Diagnosis not present

## 2022-01-06 DIAGNOSIS — L84 Corns and callosities: Secondary | ICD-10-CM | POA: Diagnosis not present

## 2022-01-06 DIAGNOSIS — B351 Tinea unguium: Secondary | ICD-10-CM | POA: Diagnosis not present

## 2022-01-23 DIAGNOSIS — G4700 Insomnia, unspecified: Secondary | ICD-10-CM | POA: Diagnosis not present

## 2022-01-23 DIAGNOSIS — E7800 Pure hypercholesterolemia, unspecified: Secondary | ICD-10-CM | POA: Diagnosis not present

## 2022-01-23 DIAGNOSIS — I10 Essential (primary) hypertension: Secondary | ICD-10-CM | POA: Diagnosis not present

## 2022-01-23 DIAGNOSIS — K219 Gastro-esophageal reflux disease without esophagitis: Secondary | ICD-10-CM | POA: Diagnosis not present

## 2022-03-09 DIAGNOSIS — K6289 Other specified diseases of anus and rectum: Secondary | ICD-10-CM | POA: Diagnosis not present

## 2022-03-09 DIAGNOSIS — Z86010 Personal history of colonic polyps: Secondary | ICD-10-CM | POA: Diagnosis not present

## 2022-03-09 DIAGNOSIS — K5900 Constipation, unspecified: Secondary | ICD-10-CM | POA: Diagnosis not present

## 2022-03-17 DIAGNOSIS — L84 Corns and callosities: Secondary | ICD-10-CM | POA: Diagnosis not present

## 2022-03-17 DIAGNOSIS — B351 Tinea unguium: Secondary | ICD-10-CM | POA: Diagnosis not present

## 2022-03-17 DIAGNOSIS — M79676 Pain in unspecified toe(s): Secondary | ICD-10-CM | POA: Diagnosis not present

## 2022-03-17 DIAGNOSIS — I70203 Unspecified atherosclerosis of native arteries of extremities, bilateral legs: Secondary | ICD-10-CM | POA: Diagnosis not present

## 2022-03-18 DIAGNOSIS — H401121 Primary open-angle glaucoma, left eye, mild stage: Secondary | ICD-10-CM | POA: Diagnosis not present

## 2022-03-23 DIAGNOSIS — D123 Benign neoplasm of transverse colon: Secondary | ICD-10-CM | POA: Diagnosis not present

## 2022-03-23 DIAGNOSIS — Z86010 Personal history of colonic polyps: Secondary | ICD-10-CM | POA: Diagnosis not present

## 2022-03-23 DIAGNOSIS — K649 Unspecified hemorrhoids: Secondary | ICD-10-CM | POA: Diagnosis not present

## 2022-03-23 DIAGNOSIS — K5730 Diverticulosis of large intestine without perforation or abscess without bleeding: Secondary | ICD-10-CM | POA: Diagnosis not present

## 2022-03-25 DIAGNOSIS — D123 Benign neoplasm of transverse colon: Secondary | ICD-10-CM | POA: Diagnosis not present

## 2022-04-14 DIAGNOSIS — H401112 Primary open-angle glaucoma, right eye, moderate stage: Secondary | ICD-10-CM | POA: Diagnosis not present

## 2022-04-14 DIAGNOSIS — Z0100 Encounter for examination of eyes and vision without abnormal findings: Secondary | ICD-10-CM | POA: Diagnosis not present

## 2022-04-20 DIAGNOSIS — E7800 Pure hypercholesterolemia, unspecified: Secondary | ICD-10-CM | POA: Diagnosis not present

## 2022-04-20 DIAGNOSIS — G4700 Insomnia, unspecified: Secondary | ICD-10-CM | POA: Diagnosis not present

## 2022-04-20 DIAGNOSIS — K219 Gastro-esophageal reflux disease without esophagitis: Secondary | ICD-10-CM | POA: Diagnosis not present

## 2022-04-20 DIAGNOSIS — G8929 Other chronic pain: Secondary | ICD-10-CM | POA: Diagnosis not present

## 2022-04-20 DIAGNOSIS — I10 Essential (primary) hypertension: Secondary | ICD-10-CM | POA: Diagnosis not present

## 2022-05-26 DIAGNOSIS — B351 Tinea unguium: Secondary | ICD-10-CM | POA: Diagnosis not present

## 2022-05-26 DIAGNOSIS — M79676 Pain in unspecified toe(s): Secondary | ICD-10-CM | POA: Diagnosis not present

## 2022-05-26 DIAGNOSIS — I70203 Unspecified atherosclerosis of native arteries of extremities, bilateral legs: Secondary | ICD-10-CM | POA: Diagnosis not present

## 2022-05-26 DIAGNOSIS — L84 Corns and callosities: Secondary | ICD-10-CM | POA: Diagnosis not present

## 2022-07-30 ENCOUNTER — Other Ambulatory Visit: Payer: Self-pay | Admitting: Radiology

## 2022-07-30 DIAGNOSIS — M5416 Radiculopathy, lumbar region: Secondary | ICD-10-CM

## 2022-07-30 MED ORDER — GABAPENTIN 100 MG PO CAPS: 100.0000 mg | ORAL_CAPSULE | Freq: Three times a day (TID) | ORAL | 2 refills | Status: DC

## 2022-08-04 DIAGNOSIS — B351 Tinea unguium: Secondary | ICD-10-CM | POA: Diagnosis not present

## 2022-08-04 DIAGNOSIS — I70203 Unspecified atherosclerosis of native arteries of extremities, bilateral legs: Secondary | ICD-10-CM | POA: Diagnosis not present

## 2022-08-04 DIAGNOSIS — L84 Corns and callosities: Secondary | ICD-10-CM | POA: Diagnosis not present

## 2022-08-04 DIAGNOSIS — M79676 Pain in unspecified toe(s): Secondary | ICD-10-CM | POA: Diagnosis not present

## 2022-08-05 DIAGNOSIS — Z23 Encounter for immunization: Secondary | ICD-10-CM | POA: Diagnosis not present

## 2022-08-12 ENCOUNTER — Encounter: Payer: Self-pay | Admitting: *Deleted

## 2022-08-12 ENCOUNTER — Telehealth: Payer: Self-pay | Admitting: *Deleted

## 2022-08-12 NOTE — Patient Instructions (Signed)
Visit Information  Thank you for taking time to visit with me today. Please don't hesitate to contact me if I can be of assistance to you.   Following are the goals we discussed today:   Goals Addressed               This Visit's Progress     no needs (pt-stated)        Care Coordination Interventions: Provided education to patient and/or caregiver about advanced directives Reviewed medications with patient and discussed adherence with medications with no needed refills Reviewed scheduled/upcoming provider appointments including pending appointments with sufficient transportation source Screening for signs and symptoms of depression related to chronic disease state  Assessed social determinant of health barriers         Please call the care guide team at [phone] if you need to cancel or reschedule your appointment.   If you are experiencing a Mental Health or [place] or need someone to talk to, please call the Suicide and Crisis Lifeline: 988  The patient verbalized understanding of instructions, educational materials, and care plan provided today and agreed to receive a mailed copy of patient instructions, educational materials, and care plan.   No further follow up required: No further needs today   Raina Mina, RN Care Management Coordinator [place] [facility] [phone]

## 2022-08-12 NOTE — Patient Outreach (Signed)
  Care Coordination   Initial Visit Note   08/12/2022 Name: John Hanson MRN: [id number] DOB: 07/13/1941  John Hanson is a 81 y.o. year old male who sees Koirala, Dibas, MD for primary care. I spoke with  John Hanson by phone today.  What matters to the patients health and wellness today?  No needs today    Goals Addressed               This Visit's Progress     no needs (pt-stated)        Care Coordination Interventions: Provided education to patient and/or caregiver about advanced directives Reviewed medications with patient and discussed adherence with medications with no needed refills Reviewed scheduled/upcoming provider appointments including pending appointments with sufficient transportation source Screening for signs and symptoms of depression related to chronic disease state  Assessed social determinant of health barriers          SDOH assessments and interventions completed:  Yes  SDOH Interventions Today    Flowsheet Row Most Recent Value  SDOH Interventions   Food Insecurity Interventions Intervention Not Indicated  Housing Interventions Intervention Not Indicated  Transportation Interventions Intervention Not Indicated  Utilities Interventions Intervention Not Indicated       Care Coordination Interventions Activated:  Yes  Care Coordination Interventions:  Yes, provided   Follow up plan: No further intervention required.   Encounter Outcome:  Pt. Visit Completed   Raina Mina, RN Care Management Coordinator [place] [facility] [phone]

## 2022-08-17 DIAGNOSIS — H401121 Primary open-angle glaucoma, left eye, mild stage: Secondary | ICD-10-CM | POA: Diagnosis not present

## 2022-10-07 DIAGNOSIS — E785 Hyperlipidemia, unspecified: Secondary | ICD-10-CM | POA: Diagnosis not present

## 2022-10-07 DIAGNOSIS — G8929 Other chronic pain: Secondary | ICD-10-CM | POA: Diagnosis not present

## 2022-10-07 DIAGNOSIS — M1990 Unspecified osteoarthritis, unspecified site: Secondary | ICD-10-CM | POA: Diagnosis not present

## 2022-10-07 DIAGNOSIS — K219 Gastro-esophageal reflux disease without esophagitis: Secondary | ICD-10-CM | POA: Diagnosis not present

## 2022-10-07 DIAGNOSIS — I10 Essential (primary) hypertension: Secondary | ICD-10-CM | POA: Diagnosis not present

## 2022-10-13 DIAGNOSIS — B351 Tinea unguium: Secondary | ICD-10-CM | POA: Diagnosis not present

## 2022-10-13 DIAGNOSIS — M79676 Pain in unspecified toe(s): Secondary | ICD-10-CM | POA: Diagnosis not present

## 2022-10-13 DIAGNOSIS — L84 Corns and callosities: Secondary | ICD-10-CM | POA: Diagnosis not present

## 2022-10-13 DIAGNOSIS — I70203 Unspecified atherosclerosis of native arteries of extremities, bilateral legs: Secondary | ICD-10-CM | POA: Diagnosis not present

## 2022-10-22 ENCOUNTER — Ambulatory Visit (HOSPITAL_COMMUNITY)
Admission: RE | Admit: 2022-10-22 | Discharge: 2022-10-22 | Disposition: A | Discharge status: Home / Self Care | Payer: Medicare HMO | Source: Ambulatory Visit | Attending: Internal Medicine | Admitting: Internal Medicine

## 2022-10-22 ENCOUNTER — Other Ambulatory Visit (HOSPITAL_COMMUNITY): Payer: Self-pay | Admitting: Specialist

## 2022-10-22 DIAGNOSIS — M7989 Other specified soft tissue disorders: Secondary | ICD-10-CM | POA: Insufficient documentation

## 2022-10-22 DIAGNOSIS — M79661 Pain in right lower leg: Secondary | ICD-10-CM | POA: Diagnosis not present

## 2022-10-22 DIAGNOSIS — M79604 Pain in right leg: Secondary | ICD-10-CM | POA: Insufficient documentation

## 2022-10-22 DIAGNOSIS — M545 Low back pain, unspecified: Secondary | ICD-10-CM | POA: Diagnosis not present

## 2022-10-22 DIAGNOSIS — M79605 Pain in left leg: Secondary | ICD-10-CM | POA: Diagnosis not present

## 2022-12-10 DIAGNOSIS — M79661 Pain in right lower leg: Secondary | ICD-10-CM | POA: Diagnosis not present

## 2022-12-16 DIAGNOSIS — M79661 Pain in right lower leg: Secondary | ICD-10-CM | POA: Diagnosis not present

## 2022-12-22 DIAGNOSIS — M1711 Unilateral primary osteoarthritis, right knee: Secondary | ICD-10-CM | POA: Diagnosis not present

## 2022-12-22 DIAGNOSIS — E7800 Pure hypercholesterolemia, unspecified: Secondary | ICD-10-CM | POA: Diagnosis not present

## 2022-12-22 DIAGNOSIS — M109 Gout, unspecified: Secondary | ICD-10-CM | POA: Diagnosis not present

## 2022-12-22 DIAGNOSIS — R7303 Prediabetes: Secondary | ICD-10-CM | POA: Diagnosis not present

## 2022-12-22 DIAGNOSIS — Z0001 Encounter for general adult medical examination with abnormal findings: Secondary | ICD-10-CM | POA: Diagnosis not present

## 2022-12-22 DIAGNOSIS — R7301 Impaired fasting glucose: Secondary | ICD-10-CM | POA: Diagnosis not present

## 2022-12-22 DIAGNOSIS — F419 Anxiety disorder, unspecified: Secondary | ICD-10-CM | POA: Diagnosis not present

## 2022-12-22 DIAGNOSIS — I77811 Abdominal aortic ectasia: Secondary | ICD-10-CM | POA: Diagnosis not present

## 2022-12-22 DIAGNOSIS — Z79899 Other long term (current) drug therapy: Secondary | ICD-10-CM | POA: Diagnosis not present

## 2022-12-22 DIAGNOSIS — Z9181 History of falling: Secondary | ICD-10-CM | POA: Diagnosis not present

## 2022-12-24 DIAGNOSIS — M79661 Pain in right lower leg: Secondary | ICD-10-CM | POA: Diagnosis not present

## 2022-12-29 DIAGNOSIS — I70203 Unspecified atherosclerosis of native arteries of extremities, bilateral legs: Secondary | ICD-10-CM | POA: Diagnosis not present

## 2022-12-29 DIAGNOSIS — M79673 Pain in unspecified foot: Secondary | ICD-10-CM | POA: Diagnosis not present

## 2022-12-29 DIAGNOSIS — B351 Tinea unguium: Secondary | ICD-10-CM | POA: Diagnosis not present

## 2022-12-29 DIAGNOSIS — L84 Corns and callosities: Secondary | ICD-10-CM | POA: Diagnosis not present

## 2023-01-27 DIAGNOSIS — H401113 Primary open-angle glaucoma, right eye, severe stage: Secondary | ICD-10-CM | POA: Diagnosis not present

## 2023-03-05 DIAGNOSIS — M19071 Primary osteoarthritis, right ankle and foot: Secondary | ICD-10-CM | POA: Diagnosis not present

## 2023-03-05 DIAGNOSIS — M1711 Unilateral primary osteoarthritis, right knee: Secondary | ICD-10-CM | POA: Diagnosis not present

## 2023-03-09 DIAGNOSIS — M79676 Pain in unspecified toe(s): Secondary | ICD-10-CM | POA: Diagnosis not present

## 2023-03-09 DIAGNOSIS — L84 Corns and callosities: Secondary | ICD-10-CM | POA: Diagnosis not present

## 2023-03-09 DIAGNOSIS — B351 Tinea unguium: Secondary | ICD-10-CM | POA: Diagnosis not present

## 2023-03-09 DIAGNOSIS — I70203 Unspecified atherosclerosis of native arteries of extremities, bilateral legs: Secondary | ICD-10-CM | POA: Diagnosis not present

## 2023-04-05 DIAGNOSIS — M533 Sacrococcygeal disorders, not elsewhere classified: Secondary | ICD-10-CM | POA: Diagnosis not present

## 2023-04-05 DIAGNOSIS — M1711 Unilateral primary osteoarthritis, right knee: Secondary | ICD-10-CM | POA: Diagnosis not present

## 2023-04-21 DIAGNOSIS — H401113 Primary open-angle glaucoma, right eye, severe stage: Secondary | ICD-10-CM | POA: Diagnosis not present

## 2023-05-18 DIAGNOSIS — I70203 Unspecified atherosclerosis of native arteries of extremities, bilateral legs: Secondary | ICD-10-CM | POA: Diagnosis not present

## 2023-05-18 DIAGNOSIS — M79676 Pain in unspecified toe(s): Secondary | ICD-10-CM | POA: Diagnosis not present

## 2023-05-18 DIAGNOSIS — B351 Tinea unguium: Secondary | ICD-10-CM | POA: Diagnosis not present

## 2023-05-18 DIAGNOSIS — L84 Corns and callosities: Secondary | ICD-10-CM | POA: Diagnosis not present

## 2023-06-02 DIAGNOSIS — H401113 Primary open-angle glaucoma, right eye, severe stage: Secondary | ICD-10-CM | POA: Diagnosis not present

## 2023-06-07 DIAGNOSIS — M1711 Unilateral primary osteoarthritis, right knee: Secondary | ICD-10-CM | POA: Diagnosis not present

## 2023-06-16 DIAGNOSIS — M1711 Unilateral primary osteoarthritis, right knee: Secondary | ICD-10-CM | POA: Diagnosis not present

## 2023-06-23 DIAGNOSIS — M1711 Unilateral primary osteoarthritis, right knee: Secondary | ICD-10-CM | POA: Diagnosis not present

## 2023-06-24 DIAGNOSIS — M109 Gout, unspecified: Secondary | ICD-10-CM | POA: Diagnosis not present

## 2023-06-24 DIAGNOSIS — Z23 Encounter for immunization: Secondary | ICD-10-CM | POA: Diagnosis not present

## 2023-06-24 DIAGNOSIS — F419 Anxiety disorder, unspecified: Secondary | ICD-10-CM | POA: Diagnosis not present

## 2023-06-24 DIAGNOSIS — R7301 Impaired fasting glucose: Secondary | ICD-10-CM | POA: Diagnosis not present

## 2023-06-24 DIAGNOSIS — I77811 Abdominal aortic ectasia: Secondary | ICD-10-CM | POA: Diagnosis not present

## 2023-06-24 DIAGNOSIS — Z79899 Other long term (current) drug therapy: Secondary | ICD-10-CM | POA: Diagnosis not present

## 2023-06-24 DIAGNOSIS — R7303 Prediabetes: Secondary | ICD-10-CM | POA: Diagnosis not present

## 2023-06-24 DIAGNOSIS — E7800 Pure hypercholesterolemia, unspecified: Secondary | ICD-10-CM | POA: Diagnosis not present

## 2023-06-24 DIAGNOSIS — M1711 Unilateral primary osteoarthritis, right knee: Secondary | ICD-10-CM | POA: Diagnosis not present

## 2023-06-30 DIAGNOSIS — M1711 Unilateral primary osteoarthritis, right knee: Secondary | ICD-10-CM | POA: Diagnosis not present

## 2023-07-02 DIAGNOSIS — I10 Essential (primary) hypertension: Secondary | ICD-10-CM | POA: Diagnosis not present

## 2023-07-02 DIAGNOSIS — H5010 Unspecified exotropia: Secondary | ICD-10-CM | POA: Diagnosis not present

## 2023-07-02 DIAGNOSIS — H401133 Primary open-angle glaucoma, bilateral, severe stage: Secondary | ICD-10-CM | POA: Diagnosis not present

## 2023-07-08 DIAGNOSIS — M47816 Spondylosis without myelopathy or radiculopathy, lumbar region: Secondary | ICD-10-CM | POA: Diagnosis not present

## 2023-07-12 DIAGNOSIS — M47896 Other spondylosis, lumbar region: Secondary | ICD-10-CM | POA: Diagnosis not present

## 2023-07-16 DIAGNOSIS — M47896 Other spondylosis, lumbar region: Secondary | ICD-10-CM | POA: Diagnosis not present

## 2023-07-21 DIAGNOSIS — M47896 Other spondylosis, lumbar region: Secondary | ICD-10-CM | POA: Diagnosis not present

## 2023-07-27 DIAGNOSIS — B351 Tinea unguium: Secondary | ICD-10-CM | POA: Diagnosis not present

## 2023-07-27 DIAGNOSIS — M79676 Pain in unspecified toe(s): Secondary | ICD-10-CM | POA: Diagnosis not present

## 2023-07-27 DIAGNOSIS — L84 Corns and callosities: Secondary | ICD-10-CM | POA: Diagnosis not present

## 2023-07-27 DIAGNOSIS — I70203 Unspecified atherosclerosis of native arteries of extremities, bilateral legs: Secondary | ICD-10-CM | POA: Diagnosis not present

## 2023-07-28 DIAGNOSIS — M47896 Other spondylosis, lumbar region: Secondary | ICD-10-CM | POA: Diagnosis not present

## 2023-07-30 DIAGNOSIS — I10 Essential (primary) hypertension: Secondary | ICD-10-CM | POA: Diagnosis not present

## 2023-07-30 DIAGNOSIS — H5010 Unspecified exotropia: Secondary | ICD-10-CM | POA: Diagnosis not present

## 2023-07-30 DIAGNOSIS — H401133 Primary open-angle glaucoma, bilateral, severe stage: Secondary | ICD-10-CM | POA: Diagnosis not present

## 2023-08-04 DIAGNOSIS — M47896 Other spondylosis, lumbar region: Secondary | ICD-10-CM | POA: Diagnosis not present

## 2023-08-31 DIAGNOSIS — M47816 Spondylosis without myelopathy or radiculopathy, lumbar region: Secondary | ICD-10-CM | POA: Diagnosis not present

## 2023-09-03 DIAGNOSIS — H401133 Primary open-angle glaucoma, bilateral, severe stage: Secondary | ICD-10-CM | POA: Diagnosis not present

## 2023-09-09 DIAGNOSIS — M545 Low back pain, unspecified: Secondary | ICD-10-CM | POA: Diagnosis not present

## 2023-09-28 DIAGNOSIS — M5416 Radiculopathy, lumbar region: Secondary | ICD-10-CM | POA: Diagnosis not present

## 2023-10-05 DIAGNOSIS — L84 Corns and callosities: Secondary | ICD-10-CM | POA: Diagnosis not present

## 2023-10-05 DIAGNOSIS — B351 Tinea unguium: Secondary | ICD-10-CM | POA: Diagnosis not present

## 2023-10-05 DIAGNOSIS — I70203 Unspecified atherosclerosis of native arteries of extremities, bilateral legs: Secondary | ICD-10-CM | POA: Diagnosis not present

## 2023-10-05 DIAGNOSIS — M79676 Pain in unspecified toe(s): Secondary | ICD-10-CM | POA: Diagnosis not present

## 2023-10-12 DIAGNOSIS — H524 Presbyopia: Secondary | ICD-10-CM | POA: Diagnosis not present

## 2023-10-12 DIAGNOSIS — H401130 Primary open-angle glaucoma, bilateral, stage unspecified: Secondary | ICD-10-CM | POA: Diagnosis not present

## 2023-10-15 DIAGNOSIS — M5416 Radiculopathy, lumbar region: Secondary | ICD-10-CM | POA: Diagnosis not present

## 2023-10-15 DIAGNOSIS — M1711 Unilateral primary osteoarthritis, right knee: Secondary | ICD-10-CM | POA: Diagnosis not present

## 2023-10-28 DIAGNOSIS — M5416 Radiculopathy, lumbar region: Secondary | ICD-10-CM | POA: Diagnosis not present

## 2023-11-11 DIAGNOSIS — M5416 Radiculopathy, lumbar region: Secondary | ICD-10-CM | POA: Diagnosis not present

## 2023-11-11 DIAGNOSIS — M5413 Radiculopathy, cervicothoracic region: Secondary | ICD-10-CM | POA: Diagnosis not present

## 2023-12-02 DIAGNOSIS — M25561 Pain in right knee: Secondary | ICD-10-CM | POA: Diagnosis not present

## 2023-12-02 DIAGNOSIS — M47816 Spondylosis without myelopathy or radiculopathy, lumbar region: Secondary | ICD-10-CM | POA: Diagnosis not present

## 2023-12-03 DIAGNOSIS — M1711 Unilateral primary osteoarthritis, right knee: Secondary | ICD-10-CM | POA: Diagnosis not present

## 2023-12-10 DIAGNOSIS — Z79899 Other long term (current) drug therapy: Secondary | ICD-10-CM | POA: Diagnosis not present

## 2023-12-10 DIAGNOSIS — I77811 Abdominal aortic ectasia: Secondary | ICD-10-CM | POA: Diagnosis not present

## 2023-12-10 DIAGNOSIS — R7301 Impaired fasting glucose: Secondary | ICD-10-CM | POA: Diagnosis not present

## 2023-12-10 DIAGNOSIS — I10 Essential (primary) hypertension: Secondary | ICD-10-CM | POA: Diagnosis not present

## 2023-12-10 DIAGNOSIS — R7303 Prediabetes: Secondary | ICD-10-CM | POA: Diagnosis not present

## 2023-12-10 DIAGNOSIS — Z01818 Encounter for other preprocedural examination: Secondary | ICD-10-CM | POA: Diagnosis not present

## 2023-12-16 DIAGNOSIS — Z01818 Encounter for other preprocedural examination: Secondary | ICD-10-CM | POA: Diagnosis not present

## 2023-12-21 DIAGNOSIS — B351 Tinea unguium: Secondary | ICD-10-CM | POA: Diagnosis not present

## 2023-12-21 DIAGNOSIS — L84 Corns and callosities: Secondary | ICD-10-CM | POA: Diagnosis not present

## 2023-12-21 DIAGNOSIS — I70203 Unspecified atherosclerosis of native arteries of extremities, bilateral legs: Secondary | ICD-10-CM | POA: Diagnosis not present

## 2023-12-21 DIAGNOSIS — M79676 Pain in unspecified toe(s): Secondary | ICD-10-CM | POA: Diagnosis not present

## 2024-01-04 DIAGNOSIS — R7303 Prediabetes: Secondary | ICD-10-CM | POA: Diagnosis not present

## 2024-01-04 DIAGNOSIS — I77811 Abdominal aortic ectasia: Secondary | ICD-10-CM | POA: Diagnosis not present

## 2024-01-04 DIAGNOSIS — R7301 Impaired fasting glucose: Secondary | ICD-10-CM | POA: Diagnosis not present

## 2024-01-04 DIAGNOSIS — E7800 Pure hypercholesterolemia, unspecified: Secondary | ICD-10-CM | POA: Diagnosis not present

## 2024-01-04 DIAGNOSIS — Z0000 Encounter for general adult medical examination without abnormal findings: Secondary | ICD-10-CM | POA: Diagnosis not present

## 2024-01-04 DIAGNOSIS — Z79899 Other long term (current) drug therapy: Secondary | ICD-10-CM | POA: Diagnosis not present

## 2024-01-04 DIAGNOSIS — M1711 Unilateral primary osteoarthritis, right knee: Secondary | ICD-10-CM | POA: Diagnosis not present

## 2024-01-04 DIAGNOSIS — M109 Gout, unspecified: Secondary | ICD-10-CM | POA: Diagnosis not present

## 2024-01-19 DIAGNOSIS — M1711 Unilateral primary osteoarthritis, right knee: Secondary | ICD-10-CM | POA: Diagnosis not present

## 2024-01-25 NOTE — Patient Instructions (Signed)
 SURGICAL WAITING ROOM VISITATION  Patients having surgery or a procedure may have no more than 2 support people in the waiting area - these visitors may rotate.    Children under the age of 58 must have an adult with them who is not the patient.  Due to an increase in RSV and influenza rates and associated hospitalizations, children ages 73 and under may not visit patients in [facility] hospitals.  Visitors with respiratory illnesses are discouraged from visiting and should remain at home.  If the patient needs to stay at the hospital during part of their recovery, the visitor guidelines for inpatient rooms apply. Pre-op nurse will coordinate an appropriate time for 1 support person to accompany patient in pre-op.  This support person may not rotate.    Please refer to the [facility] website for the visitor guidelines for Inpatients (after your surgery is over and you are in a regular room).    Your procedure is scheduled on: 02/08/24   Report to [facility] Main Entrance    Report to admitting at 7:30 AM   Call this number if you have problems the morning of surgery [phone]   Do not eat food :After Midnight.   After Midnight you may have the following liquids until 7:00 AM DAY OF SURGERY  Water  Non-Citrus Juices (without pulp, NO RED-Apple, White grape, White cranberry) Black Coffee (NO MILK/CREAM OR CREAMERS, sugar ok)  Clear Tea (NO MILK/CREAM OR CREAMERS, sugar ok) regular and decaf                             Plain Jell-O (NO RED)                                           Fruit ices (not with fruit pulp, NO RED)                                     Popsicles (NO RED)                                                               Sports drinks like Gatorade (NO RED)            The day of surgery:  Drink ONE (1) Pre-Surgery Clear Ensure at 7:00 AM the morning of surgery. Drink in one sitting. Do not sip.  This drink was given to you during your hospital  pre-op  appointment visit. Nothing else to drink after completing the  Pre-Surgery Clear Ensure.          If you have questions, please contact your surgeon's office.   FOLLOW BOWEL PREP AND ANY ADDITIONAL PRE OP INSTRUCTIONS YOU RECEIVED FROM YOUR SURGEON'S OFFICE!!!     Oral Hygiene is also important to reduce your risk of infection.                                    Remember - BRUSH YOUR TEETH THE MORNING  OF SURGERY WITH YOUR REGULAR TOOTHPASTE  DENTURES WILL BE REMOVED PRIOR TO SURGERY PLEASE DO NOT APPLY "Poly grip" OR ADHESIVES!!!   Stop all vitamins and herbal supplements 7 days before surgery.   Take these medicines the morning of surgery with A SIP OF WATER : Allopurinol , Amlodipine , Atorvastatin , Citalopram , Eye drops, Omeprazole                               You may not have any metal on your body including jewelry, and body piercing             Do not wear lotions, powders, cologne, or deodorant              Men may shave face and neck.   Do not bring valuables to the hospital. [facility] IS NOT             RESPONSIBLE   FOR VALUABLES.   Contacts, glasses, dentures or bridgework may not be worn into surgery.  DO NOT BRING YOUR HOME MEDICATIONS TO THE HOSPITAL. PHARMACY WILL DISPENSE MEDICATIONS LISTED ON YOUR MEDICATION LIST TO YOU DURING YOUR ADMISSION IN THE HOSPITAL!    Patients discharged on the day of surgery will not be allowed to drive home.  Someone NEEDS to stay with you for the first 24 hours after anesthesia.   Special Instructions: Bring a copy of your healthcare power of attorney and living will documents the day of surgery if you haven't scanned them before.              Please read over the following fact sheets you were given: IF YOU HAVE QUESTIONS ABOUT YOUR PRE-OP INSTRUCTIONS PLEASE CALL [name]   If you received a COVID test during your pre-op visit  it is requested that you wear a mask when out in public, stay away from anyone that may  not be feeling well and notify your surgeon if you develop symptoms. If you test positive for Covid or have been in contact with anyone that has tested positive in the last 10 days please notify you surgeon.      Pre-operative 5 CHG Bath Instructions   You can play a key role in reducing the risk of infection after surgery. Your skin needs to be as free of germs as possible. You can reduce the number of germs on your skin by washing with CHG (chlorhexidine  gluconate) soap before surgery. CHG is an antiseptic soap that kills germs and continues to kill germs even after washing.   DO NOT use if you have an allergy to chlorhexidine /CHG or antibacterial soaps. If your skin becomes reddened or irritated, stop using the CHG and notify one of our RNs at [phone].   Please shower with the CHG soap starting 4 days before surgery using the following schedule:     Please keep in mind the following:  DO NOT shave, including legs and underarms, starting the day of your first shower.   You may shave your face at any point before/day of surgery.  Place clean sheets on your bed the day you start using CHG soap. Use a clean washcloth (not used since being washed) for each shower. DO NOT sleep with pets once you start using the CHG.   CHG Shower Instructions:  If you choose to wash your hair and private area, wash first with your normal shampoo/soap.  After you use shampoo/soap, rinse your hair and  body thoroughly to remove shampoo/soap residue.  Turn the water  OFF and apply about 3 tablespoons (45 ml) of CHG soap to a CLEAN washcloth.  Apply CHG soap ONLY FROM YOUR NECK DOWN TO YOUR TOES (washing for 3-5 minutes)  DO NOT use CHG soap on face, private areas, open wounds, or sores.  Pay special attention to the area where your surgery is being performed.  If you are having back surgery, having someone wash your back for you may be helpful. Wait 2 minutes after CHG soap is applied, then you may rinse  off the CHG soap.  Pat dry with a clean towel  Put on clean clothes/pajamas   If you choose to wear lotion, please use ONLY the CHG-compatible lotions on the back of this paper.     Additional instructions for the day of surgery: DO NOT APPLY any lotions, deodorants, cologne, or perfumes.   Put on clean/comfortable clothes.  Brush your teeth.  Ask your nurse before applying any prescription medications to the skin.      CHG Compatible Lotions   Aveeno Moisturizing lotion  Cetaphil Moisturizing Cream  Cetaphil Moisturizing Lotion  Clairol Herbal Essence Moisturizing Lotion, Dry Skin  Clairol Herbal Essence Moisturizing Lotion, Extra Dry Skin  Clairol Herbal Essence Moisturizing Lotion, Normal Skin  Curel Age Defying Therapeutic Moisturizing Lotion with Alpha Hydroxy  Curel Extreme Care Body Lotion  Curel Soothing Hands Moisturizing Hand Lotion  Curel Therapeutic Moisturizing Cream, Fragrance-Free  Curel Therapeutic Moisturizing Lotion, Fragrance-Free  Curel Therapeutic Moisturizing Lotion, Original Formula  Eucerin Daily Replenishing Lotion  Eucerin Dry Skin Therapy Plus Alpha Hydroxy Crme  Eucerin Dry Skin Therapy Plus Alpha Hydroxy Lotion  Eucerin Original Crme  Eucerin Original Lotion  Eucerin Plus Crme Eucerin Plus Lotion  Eucerin TriLipid Replenishing Lotion  Keri Anti-Bacterial Hand Lotion  Keri Deep Conditioning Original Lotion Dry Skin Formula Softly Scented  Keri Deep Conditioning Original Lotion, Fragrance Free Sensitive Skin Formula  Keri Lotion Fast Absorbing Fragrance Free Sensitive Skin Formula  Keri Lotion Fast Absorbing Softly Scented Dry Skin Formula  Keri Original Lotion  Keri Skin Renewal Lotion Keri Silky Smooth Lotion  Keri Silky Smooth Sensitive Skin Lotion  Nivea Body Creamy Conditioning Oil  Nivea Body Extra Enriched Lotion  Nivea Body Original Lotion  Nivea Body Sheer Moisturizing Lotion Nivea Crme  Nivea Skin Firming Lotion  NutraDerm 30  Skin Lotion  NutraDerm Skin Lotion  NutraDerm Therapeutic Skin Cream  NutraDerm Therapeutic Skin Lotion  ProShield Protective Hand Cream  Provon moisturizing lotion   View Pre-Surgery Education Videos:  [email or web address]     Incentive Spirometer  An incentive spirometer is a tool that can help keep your lungs clear and active. This tool measures how well you are filling your lungs with each breath. Taking long deep breaths may help reverse or decrease the chance of developing breathing (pulmonary) problems (especially infection) following: A long period of time when you are unable to move or be active. BEFORE THE PROCEDURE  If the spirometer includes an indicator to show your best effort, your nurse or respiratory therapist will set it to a desired goal. If possible, sit up straight or lean slightly forward. Try not to slouch. Hold the incentive spirometer in an upright position. INSTRUCTIONS FOR USE  Sit on the edge of your bed if possible, or sit up as far as you can in bed or on a chair. Hold the incentive spirometer in an upright position. Breathe out normally. Place the mouthpiece in your  mouth and seal your lips tightly around it. Breathe in slowly and as deeply as possible, raising the piston or the ball toward the top of the column. Hold your breath for 3-5 seconds or for as long as possible. Allow the piston or ball to fall to the bottom of the column. Remove the mouthpiece from your mouth and breathe out normally. Rest for a few seconds and repeat Steps 1 through 7 at least 10 times every 1-2 hours when you are awake. Take your time and take a few normal breaths between deep breaths. The spirometer may include an indicator to show your best effort. Use the indicator as a goal to work toward during each repetition. After each set of 10 deep breaths, practice coughing to be sure your lungs are clear. If you have an  incision (the cut made at the time of surgery), support your incision when coughing by placing a pillow or rolled up towels firmly against it. Once you are able to get out of bed, walk around indoors and cough well. You may stop using the incentive spirometer when instructed by your caregiver.  RISKS AND COMPLICATIONS Take your time so you do not get dizzy or light-headed. If you are in pain, you may need to take or ask for pain medication before doing incentive spirometry. It is harder to take a deep breath if you are having pain. AFTER USE Rest and breathe slowly and easily. It can be helpful to keep track of a log of your progress. Your caregiver can provide you with a simple table to help with this. If you are using the spirometer at home, follow these instructions: SEEK MEDICAL CARE IF:  You are having difficultly using the spirometer. You have trouble using the spirometer as often as instructed. Your pain medication is not giving enough relief while using the spirometer. You develop fever of 100.5 F (38.1 C) or higher. SEEK IMMEDIATE MEDICAL CARE IF:  You cough up bloody sputum that had not been present before. You develop fever of 102 F (38.9 C) or greater. You develop worsening pain at or near the incision site. MAKE SURE YOU:  Understand these instructions. Will watch your condition. Will get help right away if you are not doing well or get worse. Document Released: 01/25/2007 Document Revised: 12/07/2011 Document Reviewed: 03/28/2007 [facility] Patient Information 2014 [place], [place].   ________________________________________________________________________

## 2024-01-25 NOTE — Care Plan (Signed)
 Ortho Bundle Case Management Note  Patient Details  Name: John Hanson MRN: [id number] Date of Birth: 12-12-1940  spoke with patient. he will discharge to home with family to assist. has DME. OPPT set up with [facility]. discharge instructions discussed and given to patient by PA at H&P. Patient and MD in agreement. Choice offered                     DME Arranged:    DME Agency:     HH Arranged:    HH Agency:     Additional Comments: Please contact me with any questions of if this plan should need to change.  Cornelia Dieter,  RN,BSN,MHA,CCM  [facility] Orthopaedic Specialist  [phone] 01/25/2024, 1:17 PM

## 2024-01-25 NOTE — Progress Notes (Signed)
 COVID Vaccine Completed:  Date of COVID positive in last 90 days:  PCP - Dibas Lanora Plana, MD LOV note in media tab Cardiologist - Dinah Franco, MD LOV 01/01/20  Chest x-ray -  EKG - 12/10/23 in media tab dated 12/21/23 Stress Test - 01/04/20 Epic ECHO - 05/26/17 Epic Cardiac Cath -  Pacemaker/ICD device last checked: Spinal Cord Stimulator:  Bowel Prep -   Sleep Study -  CPAP -   Fasting Blood Sugar -  Checks Blood Sugar _____ times a day  Last dose of GLP1 agonist-  N/A GLP1 instructions:  Hold 7 days before surgery    Last dose of SGLT-2 inhibitors-  N/A SGLT-2 instructions:  Hold 3 days before surgery    Blood Thinner Instructions:  Last dose:   Time: Aspirin  Instructions: Last Dose:  Activity level:  Can go up a flight of stairs and perform activities of daily living without stopping and without symptoms of chest pain or shortness of breath.  Able to exercise without symptoms  Unable to go up a flight of stairs without symptoms of     Anesthesia review: HTN, aortic vavle stenosis, murmur, DOE, OSA  Patient denies shortness of breath, fever, cough and chest pain at PAT appointment  Patient verbalized understanding of instructions that were given to them at the PAT appointment. Patient was also instructed that they will need to review over the PAT instructions again at home before surgery.

## 2024-01-26 ENCOUNTER — Encounter (HOSPITAL_COMMUNITY): Payer: Self-pay

## 2024-01-26 ENCOUNTER — Encounter (HOSPITAL_COMMUNITY)
Admission: RE | Admit: 2024-01-26 | Discharge: 2024-01-26 | Disposition: A | Discharge status: Home / Self Care | Source: Ambulatory Visit | Attending: Orthopedic Surgery | Admitting: Orthopedic Surgery

## 2024-01-26 ENCOUNTER — Other Ambulatory Visit: Payer: Self-pay

## 2024-01-26 VITALS — BP 124/70 | HR 60 | Temp 97.7°F | Resp 16 | Ht 68.0 in | Wt 217.0 lb

## 2024-01-26 DIAGNOSIS — G4730 Sleep apnea, unspecified: Secondary | ICD-10-CM | POA: Insufficient documentation

## 2024-01-26 DIAGNOSIS — Z01818 Encounter for other preprocedural examination: Secondary | ICD-10-CM

## 2024-01-26 DIAGNOSIS — Z01812 Encounter for preprocedural laboratory examination: Secondary | ICD-10-CM | POA: Diagnosis not present

## 2024-01-26 DIAGNOSIS — M1711 Unilateral primary osteoarthritis, right knee: Secondary | ICD-10-CM | POA: Insufficient documentation

## 2024-01-26 DIAGNOSIS — Z87891 Personal history of nicotine dependence: Secondary | ICD-10-CM | POA: Insufficient documentation

## 2024-01-26 DIAGNOSIS — I10 Essential (primary) hypertension: Secondary | ICD-10-CM | POA: Diagnosis not present

## 2024-01-26 HISTORY — DX: Prediabetes: R73.03

## 2024-01-26 LAB — BASIC METABOLIC PANEL WITH GFR
Anion gap: 6 (ref 5–15)
BUN: 22 mg/dL (ref 8–23)
CO2: 26 mmol/L (ref 22–32)
Calcium: 9.7 mg/dL (ref 8.9–10.3)
Chloride: 106 mmol/L (ref 98–111)
Creatinine, Ser: 1.19 mg/dL (ref 0.61–1.24)
GFR, Estimated: >60 mL/min (ref >60)
Glucose, Bld: 103 mg/dL — ABNORMAL HIGH (ref 70–99)
Potassium: 3.8 mmol/L (ref 3.5–5.1)
Sodium: 138 mmol/L (ref 135–145)

## 2024-01-26 LAB — CBC
HCT: 36.9 % — ABNORMAL LOW (ref 39.0–52.0)
Hemoglobin: 11.9 g/dL — ABNORMAL LOW (ref 13.0–17.0)
MCH: 34.3 pg — ABNORMAL HIGH (ref 26.0–34.0)
MCHC: 32.2 g/dL (ref 30.0–36.0)
MCV: 106.3 fL — ABNORMAL HIGH (ref 80.0–100.0)
Platelets: 192 10*3/uL (ref 150–400)
RBC: 3.47 MIL/uL — ABNORMAL LOW (ref 4.22–5.81)
RDW: 13.6 % (ref 11.5–15.5)
WBC: 5.2 10*3/uL (ref 4.0–10.5)
nRBC: 0 % (ref 0.0–0.2)

## 2024-01-26 LAB — SURGICAL PCR SCREEN
MRSA, PCR: NEGATIVE
Staphylococcus aureus: NEGATIVE

## 2024-01-27 NOTE — Progress Notes (Signed)
 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 former smoker with h/o HTN, sleep apnea, mild AS, right knee OA scheduled for above procedure 02/08/2024 with Dr. Osa Blase.   Clearance received from PCP which states pt is optimized from medical/cardiac standpoint. Pt was seen by PCP 12/10/2023 for preoperative evaluation.  Per OV note pt with los risk stress test in 2021, pt able to walk 4 blocks without sx, can  VS: BP 124/70   Pulse 60   Temp 36.5 C (Oral)   Resp 16   Ht 5\' 8"  (1.727 m)   Wt 98.4 kg   SpO2 97%   BMI 32.99 kg/m   PROVIDERS: Koirala, Dibas, MD is PCP    LABS: Labs reviewed: Acceptable for surgery. (all labs ordered are listed, but only abnormal results are displayed)  Labs Reviewed  BASIC METABOLIC PANEL WITH GFR - Abnormal; Notable for the following components:      Result Value   Glucose, Bld 103 (*)    All other components within normal limits  CBC - Abnormal; Notable for the following components:   RBC 3.47 (*)    Hemoglobin 11.9 (*)    HCT 36.9 (*)    MCV 106.3 (*)    MCH 34.3 (*)    All other components within normal limits  SURGICAL PCR SCREEN     IMAGES:   EKG:   CV: Myocardial Perfusion 01/04/2020 The left ventricular ejection fraction is normal (55-65%). Nuclear stress EF: 61%. There was no ST segment deviation noted during stress. No T wave inversion was noted during stress. The study is normal. This is a low risk study.   Low risk stress nuclear study with normal perfusion and normal left ventricular regional and global systolic function.  Echo 05/26/2017 Study Conclusions   - Left ventricle: The cavity size was normal. Wall thickness was     increased in a pattern of moderate LVH. Systolic function was    normal. The estimated ejection fraction was in the range of 55%    to 60%. Wall motion was normal; there were no regional wall    motion abnormalities. Doppler parameters are consistent with    abnormal left ventricular relaxation (grade 1 diastolic    dysfunction).  - Aorta: Ascending aortic diameter: 37 mm (S).  - Aortic root: The aortic root was normal in size.  - Left atrium: The atrium was mildly dilated.   Impressions:   - Compared to the prior study, there has been no significant    interval change.  Past Medical History:  Diagnosis Date   Anxiety    Aortic stenosis    a. 06/2015 Echo: EF 50-55%, mild diff HK, Gr1 DD, mild AS w/ restricted mobility of R coronary and non-coronary cusp, mildly dil Ao root (38mm), mildly dil LA, nl RV fxn, mild TR/PR, PASP .   Arthritis    Depression    Essential hypertension    GERD (gastroesophageal reflux disease)    Numbness and tingling    fingertips bilat    Osteoarthritis    a. R>L hip pending R THA.   Pre-diabetes    Sleep apnea    a. pt states  does not use CPAP machine    Varicose veins     Past Surgical History:  Procedure Laterality Date   ENDOVENOUS ABLATION SAPHENOUS VEIN W/ LASER Right 05-31-2014   EVLA  right gretaer saphenous vein by Ouida Bloom MD   LUMBAR LAMINECTOMY/DECOMPRESSION MICRODISCECTOMY N/A 09/04/2015   Procedure: MICRO LUMBAR DECOMPRESSION L4 - L5,  L3 - L4 2 LEVELS;  Surgeon: Orvan Blanch, MD;  Location: [facility];  Service: Orthopedics;  Laterality: N/A;   QUADRICEPS TENDON REPAIR Left 02/24/2018   Procedure: LEFT QUADRICEPS TENDON REPAIR;  Surgeon: Darrin Emerald, MD;  Location: [facility];  Service: Orthopedics;  Laterality: Left;   TOTAL HIP ARTHROPLASTY Right 03/26/2016   Procedure: RIGHT TOTAL HIP ARTHROPLASTY ANTERIOR APPROACH;  Surgeon: Adonica Hoose, MD;  Location: [facility];  Service: Orthopedics;  Laterality: Right;   TOTAL HIP  ARTHROPLASTY Left 07/30/2016   Procedure: TOTAL HIP ARTHROPLASTY ANTERIOR APPROACH;  Surgeon: Adonica Hoose, MD;  Location: [facility];  Service: Orthopedics;  Laterality: Left;    MEDICATIONS:  allopurinol  (ZYLOPRIM ) 300 MG tablet   amLODipine  (NORVASC ) 10 MG tablet   atorvastatin  (LIPITOR) 20 MG tablet   brimonidine (ALPHAGAN) 0.2 % ophthalmic solution   chlorthalidone (HYGROTON) 25 MG tablet   citalopram  (CELEXA ) 20 MG tablet   dorzolamide (TRUSOPT) 2 % ophthalmic solution   irbesartan  (AVAPRO ) 75 MG tablet   labetalol  (NORMODYNE ) 300 MG tablet   latanoprost (XALATAN) 0.005 % ophthalmic solution   lidocaine  (LIDODERM ) 5 %   meloxicam (MOBIC) 15 MG tablet   omeprazole (PRILOSEC) 40 MG capsule   potassium chloride  SA (KLOR-CON ) 20 MEQ tablet   traZODone  (DESYREL ) 100 MG tablet   No current facility-administered medications for this encounter.    Chick Cotton Ward, PA-C [facility]-Surgical Testing [phone]

## 2024-01-27 NOTE — Anesthesia Preprocedure Evaluation (Addendum)
 Anesthesia Evaluation  Patient identified by MRN, date of birth, ID band Patient awake    Reviewed: Allergy & Precautions, NPO status , Patient's Chart, lab work & pertinent test results  History of Anesthesia Complications Negative for: history of anesthetic complications  Airway Mallampati: I  TM Distance: >3 FB Neck ROM: Full    Dental no notable dental hx. (+) Missing, Poor Dentition,    Pulmonary sleep apnea , former smoker   Pulmonary exam normal breath sounds clear to auscultation       Cardiovascular hypertension, (-) angina + DOE  (-) Past MI Normal cardiovascular exam+ Valvular Problems/Murmurs AS  Rhythm:Regular Rate:Normal  2018 TTE Mild AS   Neuro/Psych  PSYCHIATRIC DISORDERS Anxiety Depression       GI/Hepatic ,GERD  ,,  Endo/Other  neg diabetes    Renal/GU Lab Results      Component                Value               Date                      NA                       138                 01/26/2024                CL                       106                 01/26/2024                K                        3.8                 01/26/2024                CO2                      26                  01/26/2024                BUN                      22                  01/26/2024                CREATININE               1.19                01/26/2024                GFRNONAA                 >60                 01/26/2024                CALCIUM                   9.7  01/26/2024                ALBUMIN                   4.3                 06/20/2019                GLUCOSE                  103 (H)             01/26/2024                Musculoskeletal  (+) Arthritis , Osteoarthritis,    Abdominal   Peds  Hematology Lab Results      Component                Value               Date                      WBC                      5.2                 01/26/2024                HGB                       11.9 (L)            01/26/2024                HCT                      36.9 (L)            01/26/2024                MCV                      106.3 (H)           01/26/2024                PLT                      192                 01/26/2024              Anesthesia Other Findings All: HCTZ  Reproductive/Obstetrics                             Anesthesia Physical Anesthesia Plan  ASA: 3  Anesthesia Plan: Spinal and Regional   Post-op Pain Management: Regional block* and Minimal or no pain anticipated   Induction: Intravenous  PONV Risk Score and Plan: 2 and Treatment may vary due to age or medical condition and Ondansetron   Airway Management Planned: Natural Airway and Nasal Cannula  Additional Equipment: None  Intra-op Plan:   Post-operative Plan: Extubation in OR  Informed Consent: I have reviewed the patients History and Physical, chart, labs and discussed the procedure including the risks, benefits and alternatives for the proposed anesthesia with the patient or authorized representative who has indicated his/her understanding and acceptance.  Dental advisory given  Plan Discussed with: CRNA and Surgeon  Anesthesia Plan Comments: (See PAT note 01/26/2024  Spinal w R adductor)       Anesthesia Quick Evaluation

## 2024-01-31 NOTE — H&P (Signed)
 KNEE ARTHROPLASTY ADMISSION H&P  Patient ID: John Hanson MRN: [id number] DOB/AGE: Sep 20, 1941 83 y.o.  Chief Complaint: right knee pain.  Planned Procedure Date: 02/08/24 Medical Clearance by Dr. Candance Certain     HPI: John Hanson is a 83 y.o. male who presents for evaluation of right knee OA. The patient has a history of pain and functional disability in the right knee due to arthritis and has failed non-surgical conservative treatments for greater than 12 weeks to include NSAID's and/or analgesics, corticosteriod injections, viscosupplementation injections, and activity modification.  Onset of symptoms was gradual, starting 5 years ago with gradually worsening course since that time. The patient noted no past surgery on the right knee.  Patient currently rates pain at 5 out of 10 with activity. Patient has worsening of pain with activity and weight bearing and pain that interferes with activities of daily living.  Patient has evidence of joint space narrowing by imaging studies.  There is no active infection.  Past Medical History:  Diagnosis Date   Anxiety    Aortic stenosis    a. 06/2015 Echo: EF 50-55%, mild diff HK, Gr1 DD, mild AS w/ restricted mobility of R coronary and non-coronary cusp, mildly dil Ao root (38mm), mildly dil LA, nl RV fxn, mild TR/PR, PASP .   Arthritis    Depression    Essential hypertension    GERD (gastroesophageal reflux disease)    Numbness and tingling    fingertips bilat    Osteoarthritis    a. R>L hip pending R THA.   Pre-diabetes    Sleep apnea    a. pt states does not use CPAP machine    Varicose veins    Past Surgical History:  Procedure Laterality Date   ENDOVENOUS ABLATION SAPHENOUS VEIN W/ LASER Right 05-31-2014   EVLA  right gretaer saphenous vein by Ouida Bloom MD   LUMBAR LAMINECTOMY/DECOMPRESSION MICRODISCECTOMY N/A 09/04/2015   Procedure: MICRO LUMBAR DECOMPRESSION L4 - L5,  L3 - L4 2 LEVELS;  Surgeon: Orvan Blanch, MD;   Location: [facility];  Service: Orthopedics;  Laterality: N/A;   QUADRICEPS TENDON REPAIR Left 02/24/2018   Procedure: LEFT QUADRICEPS TENDON REPAIR;  Surgeon: Darrin Emerald, MD;  Location: [facility];  Service: Orthopedics;  Laterality: Left;   TOTAL HIP ARTHROPLASTY Right 03/26/2016   Procedure: RIGHT TOTAL HIP ARTHROPLASTY ANTERIOR APPROACH;  Surgeon: Adonica Hoose, MD;  Location: [facility];  Service: Orthopedics;  Laterality: Right;   TOTAL HIP ARTHROPLASTY Left 07/30/2016   Procedure: TOTAL HIP ARTHROPLASTY ANTERIOR APPROACH;  Surgeon: Adonica Hoose, MD;  Location: [facility];  Service: Orthopedics;  Laterality: Left;   Allergies  Allergen Reactions   Hydrochlorothiazide Other (See Comments)    erection problems   Shrimp [Shellfish Allergy] Other (See Comments)    Flares up gout   Prior to Admission medications   Medication Sig Start Date End Date Taking? Authorizing Provider  allopurinol  (ZYLOPRIM ) 300 MG tablet Take 300 mg by mouth in the morning. 01/12/20  Yes [provider]  amLODipine  (NORVASC ) 10 MG tablet Take 10 mg by mouth in the morning.   Yes [provider]  atorvastatin  (LIPITOR) 20 MG tablet Take 20 mg by mouth in the morning. 03/21/18  Yes [provider]  brimonidine (ALPHAGAN) 0.2 % ophthalmic solution Place 1 drop into both eyes 2 (two) times daily. 11/21/19  Yes [provider]  chlorthalidone (HYGROTON) 25 MG tablet Take 25 mg by mouth in the morning. 01/04/20  Yes [provider]  citalopram  (CELEXA ) 20 MG tablet Take 20 mg by mouth in the morning.   Yes [provider]  dorzolamide (TRUSOPT) 2 % ophthalmic solution Place 1 drop into both eyes 2 (two) times daily. 09/06/23  Yes [provider]  irbesartan  (AVAPRO ) 75 MG tablet Take 75 mg by mouth daily.   Yes [provider]  labetalol  (NORMODYNE ) 300 MG tablet Take 300 mg by mouth 3 (three) times daily.   Yes [provider]  latanoprost (XALATAN)  0.005 % ophthalmic solution Place 1 drop into both eyes at bedtime. 11/21/19  Yes [provider]  lidocaine  (LIDODERM ) 5 % Place 1 patch onto the skin daily as needed (pain.). 01/20/24  Yes [provider]  meloxicam (MOBIC) 15 MG tablet Take 15 mg by mouth daily as needed (knee pain.).   Yes [provider]  omeprazole (PRILOSEC) 40 MG capsule Take 40 mg by mouth daily before breakfast.   Yes [provider]  potassium chloride  SA (KLOR-CON ) 20 MEQ tablet Take 20 mEq by mouth in the morning. 01/05/20  Yes [provider]  traZODone  (DESYREL ) 100 MG tablet Take 100 mg by mouth at bedtime. 06/15/20  Yes [provider]   Social History   Socioeconomic History   Marital status: Widowed    Spouse name: Not on file   Number of children: Not on file   Years of education: Not on file   Highest education level: Not on file  Occupational History   Not on file  Tobacco Use   Smoking status: Former    Types: Cigars    Quit date: 09/28/1968    Years since quitting: 55.3   Smokeless tobacco: Never  Vaping Use   Vaping status: Never Used  Substance and Sexual Activity   Alcohol  use: Yes    Comment: wine monthly   Drug use: No   Sexual activity: Not on file  Other Topics Concern   Not on file  Social History Narrative   Not on file   Social Drivers of Health   Financial Resource Strain: Not on file  Food Insecurity: No Food Insecurity (08/12/2022)   Hunger Vital Sign    Worried About Running Out of Food in the Last Year: Never true    Ran Out of Food in the Last Year: Never true  Transportation Needs: No Transportation Needs (08/12/2022)   PRAPARE - Administrator, Civil Service (Medical): No    Lack of Transportation (Non-Medical): No  Physical Activity: Not on file  Stress: Not on file  Social Connections: Not on file   Family History  Problem Relation Age of Onset   Kidney disease Mother     ROS: Currently denies  lightheadedness, dizziness, Fever, chills, CP, SOB.   No personal history of DVT, PE, MI, or CVA. No loose teeth + dentures All other systems have been reviewed and were otherwise currently negative with the exception of those mentioned in the HPI and as above.  Objective: Vitals: HT: 5 feet, 6 inches; WT: 220 lb; BMI: 35.5; BP: 169/87; PULSE: 69; TEMP: 97.5; O2: 97% on room air. Physical Exam: General: Alert, NAD.  Antalgic Gait  HEENT: EOMI, Good Neck Extension  Pulm: No increased work of breathing.  Clear B/L A/P w/o crackle or wheeze.  CV: RRR, No m/g/r appreciated  GI: soft, NT, ND Neuro: Neuro without gross focal deficit.  Sensation intact distally Skin: No lesions in the area of chief complaint MSK/Surgical Site:  He has 10 to 80 degrees of range of motion of the right knee.  No effusion or erythema.  Positive medial and lateral joint line tenderness.  EHL and FHL intact.  Distal sensation intact.     Imaging Review Plain radiographs demonstrate severe degenerative joint disease of the right knee.   Preoperative templating of the joint replacement has been completed, documented, and submitted to the Operating Room personnel in order to optimize intra-operative equipment management.  Assessment: right knee OA    Plan: Plan for Procedure(s): ARTHROPLASTY, KNEE, TOTAL  The patient history, physical exam, clinical judgement of the provider and imaging are consistent with end stage degenerative joint disease and total joint arthroplasty is deemed medically necessary. The treatment options including medical management, injection therapy, and arthroplasty were discussed at length. The risks and benefits of Procedure(s): ARTHROPLASTY, KNEE, TOTAL were presented and reviewed.  The risks of nonoperative treatment, versus surgical intervention including but not limited to continued pain, aseptic loosening, stiffness, dislocation/subluxation, infection, bleeding, nerve injury, blood  clots, cardiopulmonary complications, morbidity, mortality, among others were discussed. The patient verbalizes understanding and wishes to proceed with the plan.  Patient is being admitted for inpatient treatment for surgery, pain control, PT, prophylactic antibiotics, VTE prophylaxis, progressive ambulation, ADL's and discharge planning.   The patient does meet the criteria for TXA which will be used perioperatively.   ASA 325 mg will be used postoperatively for DVT prophylaxis in addition to SCDs, and early ambulation.   Marice Guidone K Letonia Stead, PA-C 01/31/2024 4:05 PM

## 2024-02-03 DIAGNOSIS — R262 Difficulty in walking, not elsewhere classified: Secondary | ICD-10-CM | POA: Diagnosis not present

## 2024-02-03 DIAGNOSIS — M25561 Pain in right knee: Secondary | ICD-10-CM | POA: Diagnosis not present

## 2024-02-03 DIAGNOSIS — M6281 Muscle weakness (generalized): Secondary | ICD-10-CM | POA: Diagnosis not present

## 2024-02-08 ENCOUNTER — Other Ambulatory Visit: Payer: Self-pay

## 2024-02-08 ENCOUNTER — Encounter (HOSPITAL_COMMUNITY): Payer: Self-pay | Admitting: Orthopedic Surgery

## 2024-02-08 ENCOUNTER — Ambulatory Visit (HOSPITAL_BASED_OUTPATIENT_CLINIC_OR_DEPARTMENT_OTHER): Admitting: Certified Registered"

## 2024-02-08 ENCOUNTER — Encounter (HOSPITAL_COMMUNITY)
Admission: RE | Disposition: A | Discharge status: Home / Self Care | Payer: Self-pay | Source: Ambulatory Visit | Attending: Orthopedic Surgery

## 2024-02-08 ENCOUNTER — Ambulatory Visit (HOSPITAL_COMMUNITY): Payer: Self-pay | Admitting: Physician Assistant

## 2024-02-08 ENCOUNTER — Ambulatory Visit (HOSPITAL_COMMUNITY)
Admission: RE | Admit: 2024-02-08 | Discharge: 2024-02-08 | Disposition: A | Discharge status: Home / Self Care | Source: Ambulatory Visit | Attending: Orthopedic Surgery | Admitting: Orthopedic Surgery

## 2024-02-08 ENCOUNTER — Ambulatory Visit (HOSPITAL_COMMUNITY)

## 2024-02-08 DIAGNOSIS — I350 Nonrheumatic aortic (valve) stenosis: Secondary | ICD-10-CM | POA: Insufficient documentation

## 2024-02-08 DIAGNOSIS — Z471 Aftercare following joint replacement surgery: Secondary | ICD-10-CM | POA: Diagnosis not present

## 2024-02-08 DIAGNOSIS — F32A Depression, unspecified: Secondary | ICD-10-CM | POA: Diagnosis not present

## 2024-02-08 DIAGNOSIS — G4730 Sleep apnea, unspecified: Secondary | ICD-10-CM | POA: Insufficient documentation

## 2024-02-08 DIAGNOSIS — M1711 Unilateral primary osteoarthritis, right knee: Secondary | ICD-10-CM | POA: Diagnosis not present

## 2024-02-08 DIAGNOSIS — I10 Essential (primary) hypertension: Secondary | ICD-10-CM | POA: Diagnosis not present

## 2024-02-08 DIAGNOSIS — M25761 Osteophyte, right knee: Secondary | ICD-10-CM | POA: Diagnosis not present

## 2024-02-08 DIAGNOSIS — F419 Anxiety disorder, unspecified: Secondary | ICD-10-CM | POA: Diagnosis not present

## 2024-02-08 DIAGNOSIS — G8918 Other acute postprocedural pain: Secondary | ICD-10-CM | POA: Diagnosis not present

## 2024-02-08 DIAGNOSIS — K219 Gastro-esophageal reflux disease without esophagitis: Secondary | ICD-10-CM | POA: Insufficient documentation

## 2024-02-08 DIAGNOSIS — E785 Hyperlipidemia, unspecified: Secondary | ICD-10-CM | POA: Diagnosis not present

## 2024-02-08 DIAGNOSIS — R609 Edema, unspecified: Secondary | ICD-10-CM | POA: Diagnosis not present

## 2024-02-08 DIAGNOSIS — Z87891 Personal history of nicotine dependence: Secondary | ICD-10-CM | POA: Diagnosis not present

## 2024-02-08 DIAGNOSIS — Z96651 Presence of right artificial knee joint: Secondary | ICD-10-CM | POA: Diagnosis not present

## 2024-02-08 HISTORY — PX: TOTAL KNEE ARTHROPLASTY: SHX125

## 2024-02-08 SURGERY — ARTHROPLASTY, KNEE, TOTAL
Anesthesia: Regional | Site: Knee | Laterality: Right

## 2024-02-08 MED ORDER — TRANEXAMIC ACID-NACL 1000-0.7 MG/100ML-% IV SOLN
1000.0000 mg | Freq: Once | INTRAVENOUS | Status: AC
  Administered 2024-02-08: 1000 mg via INTRAVENOUS

## 2024-02-08 MED ORDER — LIDOCAINE HCL (CARDIAC) PF 100 MG/5ML IV SOSY
PREFILLED_SYRINGE | INTRAVENOUS | Status: DC | PRN
Start: 2024-02-08 — End: 2024-02-08
  Administered 2024-02-08: 50 mg via INTRAVENOUS

## 2024-02-08 MED ORDER — BUPIVACAINE HCL (PF) 0.25 % IJ SOLN
INTRAMUSCULAR | Status: AC
  Filled 2024-02-08: qty 30

## 2024-02-08 MED ORDER — FENTANYL CITRATE PF 50 MCG/ML IJ SOSY
25.0000 ug | PREFILLED_SYRINGE | INTRAMUSCULAR | Status: DC | PRN
  Administered 2024-02-08: 25 ug via INTRAVENOUS

## 2024-02-08 MED ORDER — ORAL CARE MOUTH RINSE: 15.0000 mL | Freq: Once | OROMUCOSAL | Status: AC

## 2024-02-08 MED ORDER — ONDANSETRON HCL 4 MG/2ML IJ SOLN
INTRAMUSCULAR | Status: DC | PRN
  Administered 2024-02-08: 4 mg via INTRAVENOUS

## 2024-02-08 MED ORDER — KETOROLAC TROMETHAMINE 30 MG/ML IJ SOLN
INTRAMUSCULAR | Status: AC
  Filled 2024-02-08: qty 1

## 2024-02-08 MED ORDER — LIDOCAINE HCL (PF) 2 % IJ SOLN
INTRAMUSCULAR | Status: AC
  Filled 2024-02-08: qty 5

## 2024-02-08 MED ORDER — LACTATED RINGERS IV SOLN: INTRAVENOUS | Status: DC

## 2024-02-08 MED ORDER — POVIDONE-IODINE 7.5 % EX SOLN
Freq: Once | CUTANEOUS | Status: DC
Start: 2024-02-08 — End: 2024-02-08

## 2024-02-08 MED ORDER — METHOCARBAMOL 500 MG PO TABS: 500.0000 mg | ORAL_TABLET | Freq: Four times a day (QID) | ORAL | 0 refills | Status: AC | PRN

## 2024-02-08 MED ORDER — TRANEXAMIC ACID-NACL 1000-0.7 MG/100ML-% IV SOLN
1000.0000 mg | INTRAVENOUS | Status: AC
  Administered 2024-02-08: 1000 mg via INTRAVENOUS
  Filled 2024-02-08: qty 100

## 2024-02-08 MED ORDER — METHOCARBAMOL 500 MG PO TABS
ORAL_TABLET | ORAL | Status: AC
  Filled 2024-02-08: qty 1

## 2024-02-08 MED ORDER — ACETAMINOPHEN 10 MG/ML IV SOLN: 1000.0000 mg | Freq: Once | INTRAVENOUS | Status: DC | PRN

## 2024-02-08 MED ORDER — METHOCARBAMOL 500 MG PO TABS
500.0000 mg | ORAL_TABLET | Freq: Four times a day (QID) | ORAL | Status: DC | PRN
  Administered 2024-02-08: 500 mg via ORAL

## 2024-02-08 MED ORDER — DEXAMETHASONE SODIUM PHOSPHATE 10 MG/ML IJ SOLN
INTRAMUSCULAR | Status: DC | PRN
  Administered 2024-02-08: 10 mg via INTRAVENOUS

## 2024-02-08 MED ORDER — LACTATED RINGERS IV BOLUS
500.0000 mL | Freq: Once | INTRAVENOUS | Status: AC
  Administered 2024-02-08: 500 mL via INTRAVENOUS

## 2024-02-08 MED ORDER — KETOROLAC TROMETHAMINE 30 MG/ML IJ SOLN
INTRAMUSCULAR | Status: DC | PRN
  Administered 2024-02-08: 30 mg

## 2024-02-08 MED ORDER — POVIDONE-IODINE 10 % EX SWAB: 2.0000 | Freq: Once | CUTANEOUS | Status: DC

## 2024-02-08 MED ORDER — CEFAZOLIN SODIUM-DEXTROSE 2-4 GM/100ML-% IV SOLN
INTRAVENOUS | Status: AC
  Filled 2024-02-08: qty 100

## 2024-02-08 MED ORDER — FENTANYL CITRATE PF 50 MCG/ML IJ SOSY
PREFILLED_SYRINGE | INTRAMUSCULAR | Status: AC
  Filled 2024-02-08: qty 1

## 2024-02-08 MED ORDER — METOCLOPRAMIDE HCL 5 MG/ML IJ SOLN: 5.0000 mg | Freq: Three times a day (TID) | INTRAMUSCULAR | Status: DC | PRN

## 2024-02-08 MED ORDER — ASPIRIN 325 MG PO TBEC: 325.0000 mg | DELAYED_RELEASE_TABLET | Freq: Two times a day (BID) | ORAL | 0 refills | Status: AC

## 2024-02-08 MED ORDER — OXYCODONE HCL 5 MG PO TABS
5.0000 mg | ORAL_TABLET | ORAL | Status: DC | PRN
  Administered 2024-02-08: 10 mg via ORAL

## 2024-02-08 MED ORDER — CHLORHEXIDINE GLUCONATE 0.12 % MT SOLN
15.0000 mL | Freq: Once | OROMUCOSAL | Status: AC
  Administered 2024-02-08: 15 mL via OROMUCOSAL

## 2024-02-08 MED ORDER — SENNA-DOCUSATE SODIUM 8.6-50 MG PO TABS: 2.0000 | ORAL_TABLET | Freq: Every day | ORAL | 1 refills | Status: AC

## 2024-02-08 MED ORDER — SODIUM CHLORIDE 0.9 % IR SOLN
Status: DC | PRN
  Administered 2024-02-08: 1000 mL

## 2024-02-08 MED ORDER — METHOCARBAMOL 1000 MG/10ML IJ SOLN: 500.0000 mg | Freq: Four times a day (QID) | INTRAMUSCULAR | Status: DC | PRN

## 2024-02-08 MED ORDER — ONDANSETRON HCL 4 MG PO TABS: 4.0000 mg | ORAL_TABLET | Freq: Four times a day (QID) | ORAL | Status: DC | PRN

## 2024-02-08 MED ORDER — DEXAMETHASONE SODIUM PHOSPHATE 10 MG/ML IJ SOLN
INTRAMUSCULAR | Status: AC
  Filled 2024-02-08: qty 1

## 2024-02-08 MED ORDER — ONDANSETRON HCL 4 MG/2ML IJ SOLN
INTRAMUSCULAR | Status: AC
  Filled 2024-02-08: qty 2

## 2024-02-08 MED ORDER — FENTANYL CITRATE PF 50 MCG/ML IJ SOSY
50.0000 ug | PREFILLED_SYRINGE | INTRAMUSCULAR | Status: DC
  Administered 2024-02-08: 50 ug via INTRAVENOUS
  Filled 2024-02-08: qty 2

## 2024-02-08 MED ORDER — ACETAMINOPHEN 500 MG PO TABS
1000.0000 mg | ORAL_TABLET | Freq: Once | ORAL | Status: AC
  Administered 2024-02-08: 1000 mg via ORAL
  Filled 2024-02-08: qty 2

## 2024-02-08 MED ORDER — CEFAZOLIN SODIUM-DEXTROSE 2-4 GM/100ML-% IV SOLN
2.0000 g | Freq: Four times a day (QID) | INTRAVENOUS | Status: DC
  Administered 2024-02-08: 2 g via INTRAVENOUS

## 2024-02-08 MED ORDER — LACTATED RINGERS IV BOLUS: 250.0000 mL | Freq: Once | INTRAVENOUS | Status: DC

## 2024-02-08 MED ORDER — CEFAZOLIN SODIUM-DEXTROSE 2-4 GM/100ML-% IV SOLN
2.0000 g | INTRAVENOUS | Status: AC
  Administered 2024-02-08: 2 g via INTRAVENOUS
  Filled 2024-02-08: qty 100

## 2024-02-08 MED ORDER — ONDANSETRON HCL 4 MG/2ML IJ SOLN: 4.0000 mg | Freq: Once | INTRAMUSCULAR | Status: DC | PRN

## 2024-02-08 MED ORDER — FENTANYL CITRATE (PF) 100 MCG/2ML IJ SOLN
INTRAMUSCULAR | Status: DC | PRN
  Administered 2024-02-08: 50 ug via INTRAVENOUS
  Administered 2024-02-08: 25 ug via INTRAVENOUS

## 2024-02-08 MED ORDER — OXYCODONE HCL 5 MG PO TABS
ORAL_TABLET | ORAL | Status: AC
  Filled 2024-02-08: qty 2

## 2024-02-08 MED ORDER — METOCLOPRAMIDE HCL 5 MG PO TABS: 5.0000 mg | ORAL_TABLET | Freq: Three times a day (TID) | ORAL | Status: DC | PRN

## 2024-02-08 MED ORDER — TRANEXAMIC ACID-NACL 1000-0.7 MG/100ML-% IV SOLN
INTRAVENOUS | Status: AC
  Filled 2024-02-08: qty 100

## 2024-02-08 MED ORDER — PHENYLEPHRINE HCL-NACL 20-0.9 MG/250ML-% IV SOLN
INTRAVENOUS | Status: DC | PRN
  Administered 2024-02-08: 20 ug/min via INTRAVENOUS

## 2024-02-08 MED ORDER — OXYCODONE HCL 5 MG PO TABS: 5.0000 mg | ORAL_TABLET | ORAL | 0 refills | Status: AC | PRN

## 2024-02-08 MED ORDER — 0.9 % SODIUM CHLORIDE (POUR BTL) OPTIME
TOPICAL | Status: DC | PRN
  Administered 2024-02-08: 1000 mL

## 2024-02-08 MED ORDER — ONDANSETRON HCL 4 MG PO TABS: 4.0000 mg | ORAL_TABLET | Freq: Three times a day (TID) | ORAL | 0 refills | Status: AC | PRN

## 2024-02-08 MED ORDER — FENTANYL CITRATE (PF) 100 MCG/2ML IJ SOLN
INTRAMUSCULAR | Status: AC
  Filled 2024-02-08: qty 2

## 2024-02-08 MED ORDER — ONDANSETRON HCL 4 MG/2ML IJ SOLN: 4.0000 mg | Freq: Four times a day (QID) | INTRAMUSCULAR | Status: DC | PRN

## 2024-02-08 MED ORDER — WATER FOR IRRIGATION, STERILE IR SOLN
Status: DC | PRN
  Administered 2024-02-08: 1000 mL

## 2024-02-08 MED ORDER — PROPOFOL 10 MG/ML IV BOLUS
INTRAVENOUS | Status: DC | PRN
  Administered 2024-02-08: 40 ug/kg/min via INTRAVENOUS
  Administered 2024-02-08: 20 mg via INTRAVENOUS

## 2024-02-08 MED ORDER — BUPIVACAINE HCL 0.25 % IJ SOLN
INTRAMUSCULAR | Status: DC | PRN
  Administered 2024-02-08: 30 mL

## 2024-02-08 MED ORDER — PROPOFOL 10 MG/ML IV BOLUS
INTRAVENOUS | Status: AC
  Filled 2024-02-08: qty 20

## 2024-02-08 MED ORDER — HYDROMORPHONE HCL 1 MG/ML IJ SOLN: 0.5000 mg | INTRAMUSCULAR | Status: DC | PRN

## 2024-02-08 MED ORDER — GLYCOPYRROLATE 0.2 MG/ML IJ SOLN
INTRAMUSCULAR | Status: AC
  Filled 2024-02-08: qty 1

## 2024-02-08 MED ORDER — OXYCODONE HCL 5 MG PO TABS: 10.0000 mg | ORAL_TABLET | ORAL | Status: DC | PRN

## 2024-02-08 MED ORDER — GLYCOPYRROLATE 0.2 MG/ML IJ SOLN
INTRAMUSCULAR | Status: DC | PRN
  Administered 2024-02-08: .2 mg via INTRAVENOUS

## 2024-02-08 MED ORDER — BUPIVACAINE IN DEXTROSE 0.75-8.25 % IT SOLN
INTRATHECAL | Status: DC | PRN
Start: 2024-02-08 — End: 2024-02-08
  Administered 2024-02-08: 12 mg via INTRATHECAL

## 2024-02-08 SURGICAL SUPPLY — 51 items
ATTUNE MED DOME PAT 41 KNEE (Knees) IMPLANT
ATTUNE PS FEM RT SZ 6 CEM KNEE (Femur) IMPLANT
BAG COUNTER SPONGE SURGICOUNT (BAG) IMPLANT
BAG ZIPLOCK 12X15 (MISCELLANEOUS) IMPLANT
BASEPLATE TIB CMT FB PCKT SZ6 (Knees) IMPLANT
BLADE SAG 18X100X1.27 (BLADE) ×1 IMPLANT
BLADE SAW SGTL 11.0X1.19X90.0M (BLADE) IMPLANT
BLADE SAW SGTL 13X75X1.27 (BLADE) ×1 IMPLANT
BLADE SURG 15 STRL LF DISP TIS (BLADE) ×1 IMPLANT
BNDG ELASTIC 6X10 VLCR STRL LF (GAUZE/BANDAGES/DRESSINGS) ×1 IMPLANT
BOWL SMART MIX CTS (DISPOSABLE) ×1 IMPLANT
CEMENT HV SMART SET (Cement) ×2 IMPLANT
CLSR STERI-STRIP ANTIMIC 1/2X4 (GAUZE/BANDAGES/DRESSINGS) ×2 IMPLANT
COVER SURGICAL LIGHT HANDLE (MISCELLANEOUS) ×1 IMPLANT
CUFF TRNQT CYL 34X4.125X (TOURNIQUET CUFF) ×1 IMPLANT
DRAPE SHEET LG 3/4 BI-LAMINATE (DRAPES) ×1 IMPLANT
DRAPE U-SHAPE 47X51 STRL (DRAPES) ×1 IMPLANT
DRSG MEPILEX POST OP 4X12 (GAUZE/BANDAGES/DRESSINGS) ×1 IMPLANT
DURAPREP 26ML APPLICATOR (WOUND CARE) ×2 IMPLANT
ELECT PENCIL ROCKER SW 15FT (MISCELLANEOUS) ×1 IMPLANT
ELECT REM PT RETURN 15FT ADLT (MISCELLANEOUS) ×1 IMPLANT
GAUZE PAD ABD 8X10 STRL (GAUZE/BANDAGES/DRESSINGS) ×2 IMPLANT
GLOVE BIO SURGEON STRL SZ 6.5 (GLOVE) ×1 IMPLANT
GLOVE BIO SURGEON STRL SZ7.5 (GLOVE) ×1 IMPLANT
GLOVE BIOGEL PI IND STRL 7.0 (GLOVE) ×1 IMPLANT
GLOVE BIOGEL PI IND STRL 8 (GLOVE) ×1 IMPLANT
GOWN STRL SURGICAL XL XLNG (GOWN DISPOSABLE) ×2 IMPLANT
HOLDER FOLEY CATH W/STRAP (MISCELLANEOUS) ×1 IMPLANT
HOOD PEEL AWAY T7 (MISCELLANEOUS) ×3 IMPLANT
IMMOBILIZER KNEE 20 (SOFTGOODS) ×1 IMPLANT
IMMOBILIZER KNEE 20 THIGH 36 (SOFTGOODS) ×1 IMPLANT
INSERT TIB PS FB ATTUNE SZ6X5 (Knees) IMPLANT
KIT TURNOVER KIT A (KITS) ×1 IMPLANT
MANIFOLD NEPTUNE II (INSTRUMENTS) ×1 IMPLANT
NS IRRIG 1000ML POUR BTL (IV SOLUTION) ×1 IMPLANT
PACK TOTAL KNEE CUSTOM (KITS) ×1 IMPLANT
PIN STEINMAN FIXATION KNEE (PIN) IMPLANT
PROTECTOR NERVE ULNAR (MISCELLANEOUS) ×1 IMPLANT
SET HNDPC FAN SPRY TIP SCT (DISPOSABLE) ×1 IMPLANT
SET PAD KNEE POSITIONER (MISCELLANEOUS) ×1 IMPLANT
SPIKE FLUID TRANSFER (MISCELLANEOUS) IMPLANT
SUCTION TUBE FRAZIER 10FR DISP (SUCTIONS) IMPLANT
SUT MNCRL AB 3-0 PS2 18 (SUTURE) IMPLANT
SUT STRATAFIX PDS+ 0 24IN (SUTURE) ×1 IMPLANT
SUT VIC AB 1 CT1 27XBRD ANTBC (SUTURE) IMPLANT
SUT VIC AB 2-0 CT1 TAPERPNT 27 (SUTURE) ×2 IMPLANT
SUT VIC AB 3-0 SH 27X BRD (SUTURE) ×2 IMPLANT
TRAY FOLEY MTR SLVR 16FR STAT (SET/KITS/TRAYS/PACK) ×1 IMPLANT
TUBE SUCTION HIGH CAP CLEAR NV (SUCTIONS) ×1 IMPLANT
WATER STERILE IRR 1000ML POUR (IV SOLUTION) ×2 IMPLANT
WRAP KNEE MAXI GEL POST OP (GAUZE/BANDAGES/DRESSINGS) ×1 IMPLANT

## 2024-02-08 NOTE — Anesthesia Procedure Notes (Signed)
 Spinal  Patient location during procedure: OR Start time: 02/08/2024 9:15 AM End time: 02/08/2024 9:22 AM Reason for block: surgical anesthesia Staffing Performed: anesthesiologist  Anesthesiologist: Rosalita Combe, MD Performed by: Rosalita Combe, MD Authorized by: Rosalita Combe, MD   Preanesthetic Checklist Completed: patient identified, IV checked, risks and benefits discussed, surgical consent, monitors and equipment checked, pre-op evaluation and timeout performed Spinal Block Patient position: sitting Prep: DuraPrep and site prepped and draped Patient monitoring: heart rate, cardiac monitor, continuous pulse ox and blood pressure Approach: midline Location: L2-3 Injection technique: single-shot Needle Needle type: Pencan and Whitacre  Needle gauge: 22 G Needle length: 10 cm Needle insertion depth: 8 cm Assessment Sensory level: T4 Events: CSF return Additional Notes  3 Attempt (s). Pt tolerated procedure well.

## 2024-02-08 NOTE — Anesthesia Procedure Notes (Signed)
 Anesthesia Regional Block: Adductor canal block   Pre-Anesthetic Checklist: , timeout performed,  Correct Patient, Correct Site, Correct Laterality,  Correct Procedure, Correct Position, site marked,  Risks and benefits discussed,  Surgical consent,  Pre-op evaluation,  At surgeon's request and post-op pain management  Laterality: Lower and Right  Prep: chloraprep       Needles:  Injection technique: Single-shot  Needle Type: Echogenic Needle     Needle Length: 9cm  Needle Gauge: 22     Additional Needles:   Procedures:,,,, ultrasound used (permanent image in chart),,    Narrative:  Start time: 02/08/2024 8:24 AM End time: 02/08/2024 8:30 AM Injection made incrementally with aspirations every 5 mL.  Performed by: Personally  Anesthesiologist: Rosalita Combe, MD  Additional Notes: Block assessed prior to surgery. Pt tolerated procedure well.

## 2024-02-08 NOTE — Progress Notes (Signed)
 Physical Therapy Evaluation Patient Details Name: John Hanson MRN: [id number] DOB: 1940-12-12 Today's Date: 02/08/2024  History of Present Illness  83 yo male presents to therapy s/p R TKA on 02/08/2024 due to failure of conservative measures. Pt PMH includes but is not limited to: anxiety, aortic stenosis, arthritis, depression, HTN, GERD, OSA no CPAP, lumbar laminectomy and decompression, L quad tendon repair, and B THA.  Clinical Impression   John Hanson is a 83 y.o. male POD 0 s/p R TKA. Patient reports mod I with use of SPC with mobility at baseline. Patient is now limited by functional impairments (see PT problem list below) and requires CGA and cues for transfers and gait with RW. Patient was able to ambulate 50 and 30 feet x 2 feet with RW and  and cues for safe walker management. Patient educated on safe sequencing for stair mobility, fall risk prevention, pain management, use of CP/ice, and car transfers pt and son verbalized understanding of safe guarding position for people assisting with mobility. Patient instructed in exercises to facilitate ROM and circulation reviewed and HO provided. Patient will benefit from continued skilled PT interventions to address impairments and progress towards PLOF. Patient has met mobility goals at adequate level for discharge home with family support and OPPT services; will continue to follow if pt continues acute stay to progress towards Mod I goals.       If plan is discharge home, recommend the following: A little help with walking and/or transfers;A little help with bathing/dressing/bathroom;Assistance with cooking/housework;Assist for transportation   Can travel by private vehicle        Equipment Recommendations None recommended by PT  Recommendations for Other Services       Functional Status Assessment Patient has had a recent decline in their functional status and demonstrates the ability to make significant improvements in  function in a reasonable and predictable amount of time.     Precautions / Restrictions Precautions Precautions: Knee;Fall Restrictions Weight Bearing Restrictions Per Provider Order: No      Mobility  Bed Mobility Overal bed mobility: Needs Assistance Bed Mobility: Supine to Sit     Supine to sit: Supervision, HOB elevated     General bed mobility comments: min cues    Transfers Overall transfer level: Needs assistance Equipment used: Rolling walker (2 wheels) Transfers: Sit to/from Stand Sit to Stand: Contact guard assist           General transfer comment: min cues for proper UE and AD placement    Ambulation/Gait Ambulation/Gait assistance: Contact guard assist Gait Distance (Feet): 55 Feet (30 x 2) Assistive device: Rolling walker (2 wheels) Gait Pattern/deviations: Step-to pattern, Decreased stance time - right Gait velocity: decreased     General Gait Details: step almost through pattern cues for safety and proper RW management  Stairs Stairs: Yes Stairs assistance: Contact guard assist Stair Management: Two rails Number of Stairs: 3 General stair comments: min cues for safety and sequencing with step to pattern  Wheelchair Mobility     Tilt Bed    Modified Rankin (Stroke Patients Only)       Balance Overall balance assessment: Needs assistance Sitting-balance support: Feet supported Sitting balance-Leahy Scale: Good     Standing balance support: Reliant on assistive device for balance, During functional activity, Bilateral upper extremity supported Standing balance-Leahy Scale: Fair Standing balance comment: static standing no UE support  Pertinent Vitals/Pain Pain Assessment Pain Assessment: 0-10 Pain Score: 0-No pain (pt reports mild soreness no pain) Pain Descriptors / Indicators: Sore Pain Intervention(s): Limited activity within patient's tolerance, Monitored during session, Premedicated  before session, Repositioned, Ice applied    Home Living Family/patient expects to be discharged to:: Private residence Living Arrangements: Alone Available Help at Discharge: Family Type of Home: House Home Access: Stairs to enter Entrance Stairs-Rails: Doctor, general practice of Steps: 2   Home Layout: One level Home Equipment: Agricultural consultant (2 wheels);Rollator (4 wheels);Cane - single point;Grab bars - tub/shower      Prior Function Prior Level of Function : Independent/Modified Independent;Patient poor historian/Family not available (1)             Mobility Comments: mod I with use of SPC for all ADLs and slef care tasks       Extremity/Trunk Assessment        Lower Extremity Assessment Lower Extremity Assessment: RLE deficits/detail RLE Deficits / Details: ankle DF/PF 5/5 RLE Sensation: WNL    Cervical / Trunk Assessment Cervical / Trunk Assessment: Normal  Communication   Communication Communication: No apparent difficulties    Cognition Arousal: Alert Behavior During Therapy: WFL for tasks assessed/performed   PT - Cognitive impairments: No apparent impairments                         Following commands: Intact       Cueing       General Comments      Exercises Total Joint Exercises Ankle Circles/Pumps: AROM, Both, 5 reps Quad Sets: AROM, Right, 5 reps Short Arc Quad: AROM, Right, 5 reps Heel Slides: AROM, Right, 5 reps Hip ABduction/ADduction: AROM, Right, 5 reps Straight Leg Raises: AROM, Right, 5 reps Knee Flexion: AROM, Right, 5 reps, Seated   Assessment/Plan    PT Assessment Patient needs continued PT services  PT Problem List Decreased range of motion;Decreased strength;Decreased activity tolerance;Decreased balance;Decreased mobility;Decreased coordination;Pain       PT Treatment Interventions DME instruction;Gait training;Stair training;Functional mobility training;Therapeutic activities;Therapeutic  exercise;Balance training;Neuromuscular re-education;Patient/family education;Modalities    PT Goals (Current goals can be found in the Care Plan section)  Acute Rehab PT Goals Patient Stated Goal: to walk a little better PT Goal Formulation: With patient Time For Goal Achievement: 02/22/24 Potential to Achieve Goals: Good    Frequency 7X/week     Co-evaluation               AM-PAC PT "6 Clicks" Mobility  Outcome Measure Help needed turning from your back to your side while in a flat bed without using bedrails?: None Help needed moving from lying on your back to sitting on the side of a flat bed without using bedrails?: None Help needed moving to and from a bed to a chair (including a wheelchair)?: A Little Help needed standing up from a chair using your arms (e.g., wheelchair or bedside chair)?: A Little Help needed to walk in hospital room?: A Little Help needed climbing 3-5 steps with a railing? : A Little 6 Click Score: 20    End of Session Equipment Utilized During Treatment: Gait belt Activity Tolerance: Patient tolerated treatment well;No increased pain Patient left: in chair;with call bell/phone within reach;with family/visitor present Nurse Communication: Mobility status;Other (comment) (pt readiness for d/c from PT standpoint) PT Visit Diagnosis: Unsteadiness on feet (R26.81);Other abnormalities of gait and mobility (R26.89);Muscle weakness (generalized) (M62.81);History of falling (Z91.81);Difficulty in walking, not elsewhere classified (  R26.2);Pain Pain - Right/Left: Right Pain - part of body: Knee;Leg    Time: 1521-1600 PT Time Calculation (min) (ACUTE ONLY): 39 min   Charges:   PT Evaluation $PT Eval Low Complexity: 1 Low PT Treatments $Gait Training: 8-22 mins $Therapeutic Exercise: 8-22 mins PT General Charges $$ ACUTE PT VISIT: 1 Visit         Cary Clarks, PT Acute Rehab   Annalee Kiang 02/08/2024, 4:57 PM

## 2024-02-08 NOTE — Anesthesia Postprocedure Evaluation (Signed)
 Anesthesia Post Note  Patient: John Hanson  Procedure(s) Performed: ARTHROPLASTY, KNEE, TOTAL (Right: Knee)     Patient location during evaluation: Nursing Unit Anesthesia Type: Regional and Spinal Level of consciousness: oriented and awake and alert Pain management: pain level controlled Vital Signs Assessment: post-procedure vital signs reviewed and stable Respiratory status: spontaneous breathing and respiratory function stable Cardiovascular status: blood pressure returned to baseline and stable Postop Assessment: no headache, no backache, no apparent nausea or vomiting and patient able to bend at knees Anesthetic complications: no  No notable events documented.  Last Vitals:  Vitals:   02/08/24 1245 02/08/24 1300  BP: 128/78 138/84  Pulse: 71 69  Resp: 15 19  Temp:    SpO2: 94% 98%    Last Pain:  Vitals:   02/08/24 1300  TempSrc:   PainSc: 3                  Rosalita Combe

## 2024-02-08 NOTE — Interval H&P Note (Signed)
 History and Physical Interval Note:  02/08/2024 7:16 AM  John Hanson  has presented today for surgery, with the diagnosis of right knee OA.  The various methods of treatment have been discussed with the patient and family. After consideration of risks, benefits and other options for treatment, the patient has consented to  Procedure(s): ARTHROPLASTY, KNEE, TOTAL (Right) as a surgical intervention.  The patient's history has been reviewed, patient examined, no change in status, stable for surgery.  I have reviewed the patient's chart and labs.  Questions were answered to the patient's satisfaction.     Neville Barbone

## 2024-02-08 NOTE — Op Note (Signed)
 DATE OF SURGERY:  02/08/2024 TIME: 11:47 AM  PATIENT NAME:  John Hanson   AGE: 83 y.o.    PRE-OPERATIVE DIAGNOSIS: Right knee primary localized osteoarthritis  POST-OPERATIVE DIAGNOSIS:  Same  PROCEDURE: RIGHT total Knee Arthroplasty  SURGEON:  Neville Barbone, MD   ASSISTANT:  Hurshel Maidens, PA-C, present and scrubbed throughout the case, critical for assistance with exposure, retraction, instrumentation, and closure.  OPERATIVE IMPLANTS: Implant Name: CEMENT HV SMART SET - [id number] Type: Cement Inv. Item: CEMENT HV SMART SET Serial No.:  Manufacturer: [employer] Lot No.: [id number] LRB: Right No. Used: 2 Action: Implanted   Implant Name: BASEPLATE TIB CMT FB PCKT SZ6 - [id number] Type: Knees Inv. Item: BASEPLATE TIB CMT FB PCKT SZ6 Serial No.:  Manufacturer: [employer] Lot No.: [id number] LRB: Right No. Used: 1 Action: Implanted   Implant Name: ATTUNE MED DOME PAT 41 KNEE - [id number] Type: Knees Inv. Item: ATTUNE MED DOME PAT 41 KNEE Serial No.:  Manufacturer: [employer] Lot No.: [id number] LRB: Right No. Used: 1 Action: Implanted   Implant Name: ATTUNE PS FEM RT SZ 6 CEM KNEE - [id number] Type: Femur Inv. Item: ATTUNE PS FEM RT SZ 6 CEM KNEE Serial No.:  Manufacturer: [employer] Lot No.: [id number] LRB: Right No. Used: 1 Action: Implanted   Implant Name: INSERT TIB PS FB ATTUNE [id number] - [id number] Type: Knees Inv. Item: INSERT TIB PS FB ATTUNE [id number] Serial No.:  Manufacturer: [employer] Lot No.: [id number] LRB: Right No. Used: 1 Action: Implanted   PREOPERATIVE INDICATIONS:  John Hanson is a 83 y.o. year old male with end stage bone on bone degenerative arthritis of the knee who failed conservative treatment, including injections, antiinflammatories, activity modification, and assistive devices, and had significant impairment of their activities of daily living, and elected for Total Knee  Arthroplasty.   The risks, benefits, and alternatives were discussed at length including but not limited to the risks of infection, bleeding, nerve injury, stiffness, blood clots, the need for revision surgery, cardiopulmonary complications, among others, and they were willing to proceed.  OPERATIVE FINDINGS AND UNIQUE ASPECTS OF THE CASE: The knee was overall very stiff, even just bending enough to put the tourniquet on was a little challenging.  He had a significant flexion contracture, at least 15 degrees, and I was probably still a little bit shy of full extension at the completion of the case.  I cut the femur twice, in the tibia 3 times.  He seemed to have more of a valgus type configuration, the articular surfaces had tricompartmental osteophyte formation, although there was still some cartilage throughout, and there was no eburnation.  The bone quality itself was extremely strong.  It was fairly challenging to execute the cuts.  The lateral meniscus was basically gone, and I think that the lateral compartment was the worst of the disease.  He had a huge patellar osteophyte superiorly.  ESTIMATED BLOOD LOSS: 100 mL  OPERATIVE DESCRIPTION:  The patient was brought to the operative room and placed in a supine position.  Anesthesia was administered.  IV antibiotics were given.  The lower extremity was prepped and draped in the usual sterile fashion.  Time out was performed.  The leg was elevated and exsanguinated and the tourniquet was inflated.  Anterior quadriceps tendon splitting approach was performed.  The patella was everted and osteophytes were removed.  The anterior horn of the medial and lateral meniscus was removed.   The patella was then measured, and  cut with the saw.  The thickness before the cut was 27 and after the cut was 17.  A metal shield was used to protect the patella throughout the case.    The distal femur was opened with the drill and the intramedullary distal femoral  cutting jig was utilized, set at 5 degrees resecting 9 mm off the distal femur.  Care was taken to protect the collateral ligaments.  Then the extramedullary tibial cutting jig was utilized making the appropriate cut using the anterior tibial crest as a reference building in appropriate posterior slope.  Care was taken during the cut to protect the medial and collateral ligaments.  The proximal tibia was removed along with the posterior horns of the menisci.  The PCL was sacrificed.    The extensor gap was measured and found to have adequate resection, measuring to a size 5 after I cut the tibia 3 times in the femur twice.    The distal femoral sizing jig was applied, taking care to avoid notching.  This was set at 3 degrees of external rotation.  Then the 4-in-1 cutting jig was applied and the anterior and posterior femur was cut, along with the chamfer cuts.  All posterior osteophytes were removed.  The flexion gap was then measured and was symmetric with the extension gap.  I completed the distal femoral preparation using the appropriate jig to prepare the box.  The proximal tibia sized and prepared accordingly with the reamer and the punch, and then all components were trialed with the poly insert.  The knee was found to have excellent balance and full motion.    The above named components were then cemented into place and all excess cement was removed.  The real polyethylene implant was placed.  After the cement had cured I released the tourniquet and confirmed excellent hemostasis with no major posterior vessel injury.    The knee was easily taken through a range of motion and the patella tracked well and the knee irrigated copiously and the parapatellar tissue closed with Stratafix and vicryl, and subcutaneous tissue closed with vicryl, and monocryl with steri strips for the skin.  The wounds were injected with marcaine , and dressed with sterile gauze and the patient was awakened and returned to  the PACU in stable and satisfactory condition.  There were no complications.  Total tourniquet time was approximately 90 minutes.

## 2024-02-08 NOTE — Discharge Instructions (Addendum)

## 2024-02-08 NOTE — Transfer of Care (Signed)
 Immediate Anesthesia Transfer of Care Note  Patient: John Hanson  Procedure(s) Performed: ARTHROPLASTY, KNEE, TOTAL (Right: Knee)  Patient Location: PACU  Anesthesia Type:Spinal  Level of Consciousness: awake, alert , and patient cooperative  Airway & Oxygen  Therapy: Patient Spontanous Breathing and Patient connected to face mask oxygen   Post-op Assessment: Report given to RN and Post -op Vital signs reviewed and stable  Post vital signs: Reviewed and stable  Last Vitals:  Vitals Value Taken Time  BP 116/77 02/08/24 1208  Temp    Pulse 66 02/08/24 1210  Resp 12 02/08/24 1210  SpO2 100 % 02/08/24 1210  Vitals shown include unfiled device data.  Last Pain:  Vitals:   02/08/24 0840  TempSrc:   PainSc: 0-No pain         Complications: No notable events documented.

## 2024-02-09 ENCOUNTER — Encounter (HOSPITAL_COMMUNITY): Payer: Self-pay | Admitting: Orthopedic Surgery

## 2024-02-11 DIAGNOSIS — R262 Difficulty in walking, not elsewhere classified: Secondary | ICD-10-CM | POA: Diagnosis not present

## 2024-02-11 DIAGNOSIS — Z4789 Encounter for other orthopedic aftercare: Secondary | ICD-10-CM | POA: Diagnosis not present

## 2024-02-11 DIAGNOSIS — M6281 Muscle weakness (generalized): Secondary | ICD-10-CM | POA: Diagnosis not present

## 2024-02-11 DIAGNOSIS — M25561 Pain in right knee: Secondary | ICD-10-CM | POA: Diagnosis not present

## 2024-02-14 DIAGNOSIS — M6281 Muscle weakness (generalized): Secondary | ICD-10-CM | POA: Diagnosis not present

## 2024-02-14 DIAGNOSIS — R262 Difficulty in walking, not elsewhere classified: Secondary | ICD-10-CM | POA: Diagnosis not present

## 2024-02-14 DIAGNOSIS — Z4789 Encounter for other orthopedic aftercare: Secondary | ICD-10-CM | POA: Diagnosis not present

## 2024-02-14 DIAGNOSIS — M25561 Pain in right knee: Secondary | ICD-10-CM | POA: Diagnosis not present

## 2024-02-16 DIAGNOSIS — R262 Difficulty in walking, not elsewhere classified: Secondary | ICD-10-CM | POA: Diagnosis not present

## 2024-02-16 DIAGNOSIS — M6281 Muscle weakness (generalized): Secondary | ICD-10-CM | POA: Diagnosis not present

## 2024-02-16 DIAGNOSIS — M25561 Pain in right knee: Secondary | ICD-10-CM | POA: Diagnosis not present

## 2024-02-16 DIAGNOSIS — Z4789 Encounter for other orthopedic aftercare: Secondary | ICD-10-CM | POA: Diagnosis not present

## 2024-02-18 DIAGNOSIS — Z4789 Encounter for other orthopedic aftercare: Secondary | ICD-10-CM | POA: Diagnosis not present

## 2024-02-18 DIAGNOSIS — M6281 Muscle weakness (generalized): Secondary | ICD-10-CM | POA: Diagnosis not present

## 2024-02-18 DIAGNOSIS — M25561 Pain in right knee: Secondary | ICD-10-CM | POA: Diagnosis not present

## 2024-02-18 DIAGNOSIS — R262 Difficulty in walking, not elsewhere classified: Secondary | ICD-10-CM | POA: Diagnosis not present

## 2024-02-22 DIAGNOSIS — Z4789 Encounter for other orthopedic aftercare: Secondary | ICD-10-CM | POA: Diagnosis not present

## 2024-02-22 DIAGNOSIS — R262 Difficulty in walking, not elsewhere classified: Secondary | ICD-10-CM | POA: Diagnosis not present

## 2024-02-22 DIAGNOSIS — M6281 Muscle weakness (generalized): Secondary | ICD-10-CM | POA: Diagnosis not present

## 2024-02-22 DIAGNOSIS — M25561 Pain in right knee: Secondary | ICD-10-CM | POA: Diagnosis not present

## 2024-02-23 DIAGNOSIS — M1711 Unilateral primary osteoarthritis, right knee: Secondary | ICD-10-CM | POA: Diagnosis not present

## 2024-02-24 DIAGNOSIS — R262 Difficulty in walking, not elsewhere classified: Secondary | ICD-10-CM | POA: Diagnosis not present

## 2024-02-24 DIAGNOSIS — M25561 Pain in right knee: Secondary | ICD-10-CM | POA: Diagnosis not present

## 2024-02-24 DIAGNOSIS — Z4789 Encounter for other orthopedic aftercare: Secondary | ICD-10-CM | POA: Diagnosis not present

## 2024-02-24 DIAGNOSIS — M6281 Muscle weakness (generalized): Secondary | ICD-10-CM | POA: Diagnosis not present

## 2024-03-01 DIAGNOSIS — M6281 Muscle weakness (generalized): Secondary | ICD-10-CM | POA: Diagnosis not present

## 2024-03-01 DIAGNOSIS — R262 Difficulty in walking, not elsewhere classified: Secondary | ICD-10-CM | POA: Diagnosis not present

## 2024-03-01 DIAGNOSIS — Z4789 Encounter for other orthopedic aftercare: Secondary | ICD-10-CM | POA: Diagnosis not present

## 2024-03-01 DIAGNOSIS — M25561 Pain in right knee: Secondary | ICD-10-CM | POA: Diagnosis not present

## 2024-03-03 DIAGNOSIS — M6281 Muscle weakness (generalized): Secondary | ICD-10-CM | POA: Diagnosis not present

## 2024-03-03 DIAGNOSIS — M25561 Pain in right knee: Secondary | ICD-10-CM | POA: Diagnosis not present

## 2024-03-03 DIAGNOSIS — Z4789 Encounter for other orthopedic aftercare: Secondary | ICD-10-CM | POA: Diagnosis not present

## 2024-03-03 DIAGNOSIS — R262 Difficulty in walking, not elsewhere classified: Secondary | ICD-10-CM | POA: Diagnosis not present

## 2024-03-07 DIAGNOSIS — Z4789 Encounter for other orthopedic aftercare: Secondary | ICD-10-CM | POA: Diagnosis not present

## 2024-03-07 DIAGNOSIS — R262 Difficulty in walking, not elsewhere classified: Secondary | ICD-10-CM | POA: Diagnosis not present

## 2024-03-07 DIAGNOSIS — M25561 Pain in right knee: Secondary | ICD-10-CM | POA: Diagnosis not present

## 2024-03-07 DIAGNOSIS — M6281 Muscle weakness (generalized): Secondary | ICD-10-CM | POA: Diagnosis not present

## 2024-03-10 DIAGNOSIS — M25561 Pain in right knee: Secondary | ICD-10-CM | POA: Diagnosis not present

## 2024-03-10 DIAGNOSIS — Z4789 Encounter for other orthopedic aftercare: Secondary | ICD-10-CM | POA: Diagnosis not present

## 2024-03-10 DIAGNOSIS — R262 Difficulty in walking, not elsewhere classified: Secondary | ICD-10-CM | POA: Diagnosis not present

## 2024-03-10 DIAGNOSIS — M6281 Muscle weakness (generalized): Secondary | ICD-10-CM | POA: Diagnosis not present

## 2024-03-14 DIAGNOSIS — L84 Corns and callosities: Secondary | ICD-10-CM | POA: Diagnosis not present

## 2024-03-14 DIAGNOSIS — M79675 Pain in left toe(s): Secondary | ICD-10-CM | POA: Diagnosis not present

## 2024-03-14 DIAGNOSIS — I70203 Unspecified atherosclerosis of native arteries of extremities, bilateral legs: Secondary | ICD-10-CM | POA: Diagnosis not present

## 2024-03-14 DIAGNOSIS — R262 Difficulty in walking, not elsewhere classified: Secondary | ICD-10-CM | POA: Diagnosis not present

## 2024-03-14 DIAGNOSIS — M6281 Muscle weakness (generalized): Secondary | ICD-10-CM | POA: Diagnosis not present

## 2024-03-14 DIAGNOSIS — M79674 Pain in right toe(s): Secondary | ICD-10-CM | POA: Diagnosis not present

## 2024-03-14 DIAGNOSIS — B351 Tinea unguium: Secondary | ICD-10-CM | POA: Diagnosis not present

## 2024-03-14 DIAGNOSIS — Z4789 Encounter for other orthopedic aftercare: Secondary | ICD-10-CM | POA: Diagnosis not present

## 2024-03-14 DIAGNOSIS — M25561 Pain in right knee: Secondary | ICD-10-CM | POA: Diagnosis not present

## 2024-03-16 DIAGNOSIS — M25561 Pain in right knee: Secondary | ICD-10-CM | POA: Diagnosis not present

## 2024-03-16 DIAGNOSIS — M6281 Muscle weakness (generalized): Secondary | ICD-10-CM | POA: Diagnosis not present

## 2024-03-16 DIAGNOSIS — R262 Difficulty in walking, not elsewhere classified: Secondary | ICD-10-CM | POA: Diagnosis not present

## 2024-03-16 DIAGNOSIS — Z4789 Encounter for other orthopedic aftercare: Secondary | ICD-10-CM | POA: Diagnosis not present

## 2024-03-21 DIAGNOSIS — M6281 Muscle weakness (generalized): Secondary | ICD-10-CM | POA: Diagnosis not present

## 2024-03-21 DIAGNOSIS — M25561 Pain in right knee: Secondary | ICD-10-CM | POA: Diagnosis not present

## 2024-03-21 DIAGNOSIS — R262 Difficulty in walking, not elsewhere classified: Secondary | ICD-10-CM | POA: Diagnosis not present

## 2024-03-21 DIAGNOSIS — Z4789 Encounter for other orthopedic aftercare: Secondary | ICD-10-CM | POA: Diagnosis not present

## 2024-03-23 DIAGNOSIS — R262 Difficulty in walking, not elsewhere classified: Secondary | ICD-10-CM | POA: Diagnosis not present

## 2024-03-23 DIAGNOSIS — Z4789 Encounter for other orthopedic aftercare: Secondary | ICD-10-CM | POA: Diagnosis not present

## 2024-03-23 DIAGNOSIS — M25561 Pain in right knee: Secondary | ICD-10-CM | POA: Diagnosis not present

## 2024-03-23 DIAGNOSIS — M6281 Muscle weakness (generalized): Secondary | ICD-10-CM | POA: Diagnosis not present

## 2024-03-24 DIAGNOSIS — I10 Essential (primary) hypertension: Secondary | ICD-10-CM | POA: Diagnosis not present

## 2024-03-24 DIAGNOSIS — H5010 Unspecified exotropia: Secondary | ICD-10-CM | POA: Diagnosis not present

## 2024-03-24 DIAGNOSIS — H401133 Primary open-angle glaucoma, bilateral, severe stage: Secondary | ICD-10-CM | POA: Diagnosis not present

## 2024-03-27 DIAGNOSIS — E7800 Pure hypercholesterolemia, unspecified: Secondary | ICD-10-CM | POA: Diagnosis not present

## 2024-03-27 DIAGNOSIS — M1711 Unilateral primary osteoarthritis, right knee: Secondary | ICD-10-CM | POA: Diagnosis not present

## 2024-03-27 DIAGNOSIS — I10 Essential (primary) hypertension: Secondary | ICD-10-CM | POA: Diagnosis not present

## 2024-04-24 DIAGNOSIS — M545 Low back pain, unspecified: Secondary | ICD-10-CM | POA: Diagnosis not present

## 2024-04-24 DIAGNOSIS — M1711 Unilateral primary osteoarthritis, right knee: Secondary | ICD-10-CM | POA: Diagnosis not present

## 2024-04-27 DIAGNOSIS — M1711 Unilateral primary osteoarthritis, right knee: Secondary | ICD-10-CM | POA: Diagnosis not present

## 2024-04-27 DIAGNOSIS — I10 Essential (primary) hypertension: Secondary | ICD-10-CM | POA: Diagnosis not present

## 2024-04-27 DIAGNOSIS — E7800 Pure hypercholesterolemia, unspecified: Secondary | ICD-10-CM | POA: Diagnosis not present

## 2024-05-19 DIAGNOSIS — M79674 Pain in right toe(s): Secondary | ICD-10-CM | POA: Diagnosis not present

## 2024-05-19 DIAGNOSIS — M25561 Pain in right knee: Secondary | ICD-10-CM | POA: Diagnosis not present

## 2024-05-19 DIAGNOSIS — I10 Essential (primary) hypertension: Secondary | ICD-10-CM | POA: Diagnosis not present

## 2024-05-22 DIAGNOSIS — M1711 Unilateral primary osteoarthritis, right knee: Secondary | ICD-10-CM | POA: Diagnosis not present

## 2024-05-28 DIAGNOSIS — M1711 Unilateral primary osteoarthritis, right knee: Secondary | ICD-10-CM | POA: Diagnosis not present

## 2024-05-28 DIAGNOSIS — I10 Essential (primary) hypertension: Secondary | ICD-10-CM | POA: Diagnosis not present

## 2024-05-28 DIAGNOSIS — E7800 Pure hypercholesterolemia, unspecified: Secondary | ICD-10-CM | POA: Diagnosis not present

## 2024-05-31 DIAGNOSIS — Z23 Encounter for immunization: Secondary | ICD-10-CM | POA: Diagnosis not present

## 2024-06-13 DIAGNOSIS — M79674 Pain in right toe(s): Secondary | ICD-10-CM | POA: Diagnosis not present

## 2024-06-13 DIAGNOSIS — M79675 Pain in left toe(s): Secondary | ICD-10-CM | POA: Diagnosis not present

## 2024-06-13 DIAGNOSIS — B351 Tinea unguium: Secondary | ICD-10-CM | POA: Diagnosis not present

## 2024-06-13 DIAGNOSIS — I70203 Unspecified atherosclerosis of native arteries of extremities, bilateral legs: Secondary | ICD-10-CM | POA: Diagnosis not present

## 2024-06-13 DIAGNOSIS — L84 Corns and callosities: Secondary | ICD-10-CM | POA: Diagnosis not present

## 2024-06-16 DIAGNOSIS — Z23 Encounter for immunization: Secondary | ICD-10-CM | POA: Diagnosis not present

## 2024-06-27 DIAGNOSIS — I10 Essential (primary) hypertension: Secondary | ICD-10-CM | POA: Diagnosis not present

## 2024-06-27 DIAGNOSIS — E7800 Pure hypercholesterolemia, unspecified: Secondary | ICD-10-CM | POA: Diagnosis not present

## 2024-06-27 DIAGNOSIS — M1711 Unilateral primary osteoarthritis, right knee: Secondary | ICD-10-CM | POA: Diagnosis not present

## 2024-07-03 DIAGNOSIS — Z96651 Presence of right artificial knee joint: Secondary | ICD-10-CM | POA: Diagnosis not present

## 2024-07-03 DIAGNOSIS — T8484XA Pain due to internal orthopedic prosthetic devices, implants and grafts, initial encounter: Secondary | ICD-10-CM | POA: Diagnosis not present

## 2024-07-24 DIAGNOSIS — Z79899 Other long term (current) drug therapy: Secondary | ICD-10-CM | POA: Diagnosis not present
